# Patient Record
Sex: Male | Born: 1945 | Race: White | Hispanic: No | Marital: Married | Smoking: Former smoker
Health system: Southern US, Community
[De-identification: ages and names within clinical notes are randomized; demographics above are authoritative.]

## PROBLEM LIST (undated history)

## (undated) DIAGNOSIS — W57XXXA Bitten or stung by nonvenomous insect and other nonvenomous arthropods, initial encounter: Secondary | ICD-10-CM

## (undated) DIAGNOSIS — B09 Unspecified viral infection characterized by skin and mucous membrane lesions: Secondary | ICD-10-CM

## (undated) DIAGNOSIS — R55 Syncope and collapse: Secondary | ICD-10-CM

## (undated) DIAGNOSIS — H269 Unspecified cataract: Secondary | ICD-10-CM

## (undated) DIAGNOSIS — I2510 Atherosclerotic heart disease of native coronary artery without angina pectoris: Secondary | ICD-10-CM

## (undated) DIAGNOSIS — M1990 Unspecified osteoarthritis, unspecified site: Secondary | ICD-10-CM

## (undated) DIAGNOSIS — C61 Malignant neoplasm of prostate: Secondary | ICD-10-CM

## (undated) DIAGNOSIS — K219 Gastro-esophageal reflux disease without esophagitis: Secondary | ICD-10-CM

## (undated) DIAGNOSIS — M5126 Other intervertebral disc displacement, lumbar region: Secondary | ICD-10-CM

## (undated) DIAGNOSIS — K5790 Diverticulosis of intestine, part unspecified, without perforation or abscess without bleeding: Secondary | ICD-10-CM

## (undated) DIAGNOSIS — H9319 Tinnitus, unspecified ear: Secondary | ICD-10-CM

## (undated) DIAGNOSIS — G4730 Sleep apnea, unspecified: Secondary | ICD-10-CM

## (undated) DIAGNOSIS — F4329 Adjustment disorder with other symptoms: Secondary | ICD-10-CM

## (undated) DIAGNOSIS — R972 Elevated prostate specific antigen [PSA]: Secondary | ICD-10-CM

## (undated) DIAGNOSIS — N200 Calculus of kidney: Secondary | ICD-10-CM

## (undated) DIAGNOSIS — C4492 Squamous cell carcinoma of skin, unspecified: Secondary | ICD-10-CM

## (undated) DIAGNOSIS — I10 Essential (primary) hypertension: Secondary | ICD-10-CM

## (undated) DIAGNOSIS — G2581 Restless legs syndrome: Secondary | ICD-10-CM

## (undated) DIAGNOSIS — Z87442 Personal history of urinary calculi: Secondary | ICD-10-CM

## (undated) DIAGNOSIS — E785 Hyperlipidemia, unspecified: Secondary | ICD-10-CM

## (undated) DIAGNOSIS — M159 Polyosteoarthritis, unspecified: Secondary | ICD-10-CM

## (undated) DIAGNOSIS — H9190 Unspecified hearing loss, unspecified ear: Secondary | ICD-10-CM

## (undated) DIAGNOSIS — N1330 Unspecified hydronephrosis: Secondary | ICD-10-CM

## (undated) DIAGNOSIS — F419 Anxiety disorder, unspecified: Secondary | ICD-10-CM

## (undated) DIAGNOSIS — Z87438 Personal history of other diseases of male genital organs: Secondary | ICD-10-CM

## (undated) HISTORY — DX: Personal history of other diseases of male genital organs: Z87.438

## (undated) HISTORY — PX: JOINT REPLACEMENT: SHX530

## (undated) HISTORY — DX: Malignant neoplasm of prostate: C61

## (undated) HISTORY — DX: Hyperlipidemia, unspecified: E78.5

## (undated) HISTORY — DX: Calculus of kidney: N20.0

## (undated) HISTORY — DX: Polyosteoarthritis, unspecified: M15.9

## (undated) HISTORY — PX: EYE SURGERY: SHX253

## (undated) HISTORY — PX: SKIN CANCER EXCISION: SHX779

## (undated) HISTORY — DX: Elevated prostate specific antigen (PSA): R97.20

## (undated) HISTORY — PX: CATARACT EXTRACTION, BILATERAL: SHX1313

## (undated) HISTORY — DX: Syncope and collapse: R55

## (undated) HISTORY — DX: Gastro-esophageal reflux disease without esophagitis: K21.9

## (undated) HISTORY — DX: Unspecified viral infection characterized by skin and mucous membrane lesions: B09

## (undated) HISTORY — DX: Unspecified cataract: H26.9

## (undated) HISTORY — PX: TOTAL HIP ARTHROPLASTY: SHX124

## (undated) HISTORY — DX: Adjustment disorder with other symptoms: F43.29

## (undated) HISTORY — DX: Sleep apnea, unspecified: G47.30

## (undated) HISTORY — DX: Essential (primary) hypertension: I10

## (undated) HISTORY — DX: Squamous cell carcinoma of skin, unspecified: C44.92

## (undated) HISTORY — DX: Bitten or stung by nonvenomous insect and other nonvenomous arthropods, initial encounter: W57.XXXA

## (undated) HISTORY — DX: Unspecified osteoarthritis, unspecified site: M19.90

---

## 1982-03-03 HISTORY — PX: ACHILLES TENDON REPAIR: SUR1153

## 2002-11-09 ENCOUNTER — Encounter: Payer: Self-pay | Admitting: Urology

## 2002-11-09 ENCOUNTER — Encounter: Admission: RE | Admit: 2002-11-09 | Discharge: 2002-11-09 | Payer: Self-pay | Admitting: Urology

## 2002-11-10 ENCOUNTER — Encounter: Payer: Self-pay | Admitting: Urology

## 2002-11-10 ENCOUNTER — Ambulatory Visit (HOSPITAL_COMMUNITY): Admission: RE | Admit: 2002-11-10 | Discharge: 2002-11-10 | Payer: Self-pay | Admitting: Urology

## 2003-02-09 ENCOUNTER — Ambulatory Visit (HOSPITAL_COMMUNITY): Admission: RE | Admit: 2003-02-09 | Discharge: 2003-02-09 | Payer: Self-pay | Admitting: Urology

## 2006-05-29 ENCOUNTER — Encounter: Payer: Self-pay | Admitting: Family Medicine

## 2006-07-17 ENCOUNTER — Encounter: Payer: Self-pay | Admitting: Family Medicine

## 2006-10-04 ENCOUNTER — Emergency Department (HOSPITAL_COMMUNITY): Admission: EM | Admit: 2006-10-04 | Discharge: 2006-10-04 | Payer: Self-pay | Admitting: Emergency Medicine

## 2007-04-27 ENCOUNTER — Encounter: Payer: Self-pay | Admitting: Family Medicine

## 2007-08-05 ENCOUNTER — Encounter: Payer: Self-pay | Admitting: Family Medicine

## 2008-02-28 ENCOUNTER — Encounter: Payer: Self-pay | Admitting: Family Medicine

## 2008-02-28 LAB — CONVERTED CEMR LAB
Cholesterol: 118 mg/dL
Direct LDL: 60 mg/dL
HDL: 42 mg/dL
Total CHOL/HDL Ratio: 2.81
Triglycerides: 119 mg/dL

## 2008-08-09 ENCOUNTER — Encounter: Payer: Self-pay | Admitting: Family Medicine

## 2008-08-09 LAB — CONVERTED CEMR LAB
PSA: 3.45 ng/mL
PSA: 3.45 ng/mL

## 2008-09-20 ENCOUNTER — Inpatient Hospital Stay (HOSPITAL_COMMUNITY): Admission: RE | Admit: 2008-09-20 | Discharge: 2008-09-23 | Payer: Self-pay | Admitting: Orthopedic Surgery

## 2009-02-28 ENCOUNTER — Encounter: Payer: Self-pay | Admitting: Family Medicine

## 2009-02-28 LAB — CONVERTED CEMR LAB
ALT: 21 units/L
AST: 23 units/L
Albumin: 4.5 g/dL
Alkaline Phosphatase: 83 units/L
Anion Gap: 12.4
BUN: 12 mg/dL
CO2: 29 meq/L
Calcium: 9.4 mg/dL
Chloride: 102 meq/L
Creatinine, Ser: 0.8 mg/dL
GFR calc Af Amer: 118.14 mL/min
GFR calc non Af Amer: 97.64 mL/min
Glucose, Bld: 91 mg/dL
Hemoglobin: 14.7 g/dL
Potassium: 4.4 meq/L
Sodium: 139 meq/L
Total Bilirubin: 1.1 mg/dL
Total Protein: 7.1 g/dL

## 2009-03-03 DIAGNOSIS — W57XXXA Bitten or stung by nonvenomous insect and other nonvenomous arthropods, initial encounter: Secondary | ICD-10-CM

## 2009-03-03 HISTORY — DX: Bitten or stung by nonvenomous insect and other nonvenomous arthropods, initial encounter: W57.XXXA

## 2009-03-09 ENCOUNTER — Encounter: Payer: Self-pay | Admitting: Family Medicine

## 2009-09-06 ENCOUNTER — Ambulatory Visit: Payer: Self-pay | Admitting: Family Medicine

## 2009-09-06 DIAGNOSIS — M159 Polyosteoarthritis, unspecified: Secondary | ICD-10-CM | POA: Insufficient documentation

## 2009-09-06 DIAGNOSIS — N400 Enlarged prostate without lower urinary tract symptoms: Secondary | ICD-10-CM | POA: Insufficient documentation

## 2009-09-06 DIAGNOSIS — F4329 Adjustment disorder with other symptoms: Secondary | ICD-10-CM | POA: Insufficient documentation

## 2009-09-06 DIAGNOSIS — E785 Hyperlipidemia, unspecified: Secondary | ICD-10-CM | POA: Insufficient documentation

## 2009-09-06 DIAGNOSIS — I10 Essential (primary) hypertension: Secondary | ICD-10-CM | POA: Insufficient documentation

## 2009-09-06 LAB — CONVERTED CEMR LAB

## 2009-09-07 LAB — CONVERTED CEMR LAB
ALT: 19 units/L (ref 0–53)
AST: 20 units/L (ref 0–37)
Albumin: 4.3 g/dL (ref 3.5–5.2)
Alkaline Phosphatase: 74 units/L (ref 39–117)
BUN: 11 mg/dL (ref 6–23)
Bilirubin, Direct: 0.1 mg/dL (ref 0.0–0.3)
CO2: 29 meq/L (ref 19–32)
Calcium: 9 mg/dL (ref 8.4–10.5)
Chloride: 102 meq/L (ref 96–112)
Cholesterol: 118 mg/dL (ref 0–200)
Creatinine, Ser: 0.8 mg/dL (ref 0.4–1.5)
GFR calc non Af Amer: 109.67 mL/min (ref >60)
Glucose, Bld: 94 mg/dL (ref 70–99)
HDL: 39.4 mg/dL (ref >39.00)
LDL Cholesterol: 58 mg/dL (ref 0–99)
PSA: 3.94 ng/mL (ref 0.10–4.00)
Potassium: 4.3 meq/L (ref 3.5–5.1)
Sodium: 141 meq/L (ref 135–145)
Total Bilirubin: 0.6 mg/dL (ref 0.3–1.2)
Total CHOL/HDL Ratio: 3
Total Protein: 6.8 g/dL (ref 6.0–8.3)
Triglycerides: 105 mg/dL (ref 0.0–149.0)
VLDL: 21 mg/dL (ref 0.0–40.0)

## 2009-09-14 ENCOUNTER — Telehealth: Payer: Self-pay | Admitting: Family Medicine

## 2009-09-24 ENCOUNTER — Ambulatory Visit: Payer: Self-pay | Admitting: Family Medicine

## 2009-09-24 DIAGNOSIS — B09 Unspecified viral infection characterized by skin and mucous membrane lesions: Secondary | ICD-10-CM | POA: Insufficient documentation

## 2009-09-25 ENCOUNTER — Encounter: Payer: Self-pay | Admitting: Family Medicine

## 2009-09-25 DIAGNOSIS — K219 Gastro-esophageal reflux disease without esophagitis: Secondary | ICD-10-CM | POA: Insufficient documentation

## 2009-09-28 ENCOUNTER — Telehealth: Payer: Self-pay | Admitting: Family Medicine

## 2010-02-11 ENCOUNTER — Ambulatory Visit: Payer: Self-pay | Admitting: Family Medicine

## 2010-02-11 DIAGNOSIS — J0100 Acute maxillary sinusitis, unspecified: Secondary | ICD-10-CM | POA: Insufficient documentation

## 2010-03-03 HISTORY — PX: SHOULDER ARTHROSCOPY: SHX128

## 2010-03-07 ENCOUNTER — Ambulatory Visit
Admission: RE | Admit: 2010-03-07 | Discharge: 2010-03-07 | Payer: Self-pay | Source: Home / Self Care | Attending: Family Medicine | Admitting: Family Medicine

## 2010-03-12 ENCOUNTER — Telehealth: Payer: Self-pay | Admitting: Family Medicine

## 2010-04-02 NOTE — Letter (Signed)
Summary: Deboraha Sprang @ [facility] @ [employer]   Imported By: Lanelle Bal 10/01/2009 09:35:19  _____________________________________________________________________  External Attachment:    Type:   Image     Comment:   External Document

## 2010-04-02 NOTE — Assessment & Plan Note (Signed)
Summary: red rash all over body/alc   Vital Signs:  Patient profile:   65 year old male Height:      67.5 inches Weight:      179 pounds BMI:     27.72 Temp:     98.4 degrees F oral Pulse rate:   76 / minute Pulse rhythm:   regular BP sitting:   124 / 80  (left arm) Cuff size:   regular  Vitals Entered By: Delilah Shan CMA Sienna Stonehocker Dull) (September 24, 2009 3:29 PM) CC: Red rash all over body   History of Present Illness: 3 days of rash on body.  Itches. It will start out with a small red lesion and then progress to a small vesicle. These then heal up with residual red papule.  Early lesions were on the bilateral lower extremities.  They have progressed (for eruption and subsequent healing) proximally.  No FCNAV, no wheeze.  Slight irritation in throat.  No h/o sig exposures that would explain this other than new detergent.  Wife has similar rash on the wrist.  Topical tx helps with the itching. No lesions on palms or between digits.  Also with occ sweats.   Allergies: 1)  ! Ambien Cr (Zolpidem Tartrate) 2)  ! Doxycycline  Review of Systems       See HPI.  Otherwise negative.    Physical Exam  General:  GEN: nad, alert and oriented HEENT: mucous membranes moist, tm wnl, nasal epithelium wnl, superficial irritation of soft palate noted.  no exudates NECK: supple w/o LA CV: rrr.  no murmur PULM: ctab, no inc wob ABD: soft, +bs EXT: no edema SKIN: small (<1cm) lesion scattered across arms, legs, trunk.  no lesion on palms or tongue.  distal lesions are older and appear to be healing red papules.  proximal lesions are nonumbilicated vesicles.     Impression & Recommendations:  Problem # 1:  UNSPECIFIED VIRAL EXANTHEM (ICD-057.9) This is likely  viral.  I cannot see how environmental lesions would likely cause the oral symptoms and the sweats.  Nontoxic.  Likely will self resolve in the next few days.  Will call back with update.  Claritin for itching along with topical and notify MD if  worsening.  he agrees.   Complete Medication List: 1)  Caduet 10-20 Mg Tabs (Amlodipine-atorvastatin) .... Take 1 tablet by mouth once a day 2)  Celebrex 200 Mg Caps (Celecoxib) .... Take 1 tablet by mouth once a day 3)  Omeprazole 20 Mg Cpdr (Omeprazole) .... Take 1 tablet by mouth once a day 4)  Tramadol Hcl 50 Mg Tabs (Tramadol hcl) .... Take 1 tablet by mouth three times a day 5)  Aspirin 81 Mg Tabs (Aspirin) .... Take 1 tablet by mouth once a day 6)  Methocarbamol 500 Mg Tabs (Methocarbamol) .... As needed 7)  Multivitamins Tabs (Multiple vitamin) .... Take 1 tablet by mouth once a day  Patient Instructions: 1)  Please schedule a follow-up appointment as needed .  Take plain claritin (loratadine) 10mg  a day and continue to use the OTC creams as needed.  Let me know how you are doing in a few days.   Current Allergies (reviewed today): ! AMBIEN CR (ZOLPIDEM TARTRATE) ! DOXYCYCLINE

## 2010-04-02 NOTE — Progress Notes (Signed)
Summary: rash  Phone Note Call from Patient Call back at [phone]   Caller: Patient Call For: Crawford Givens MD Summary of Call: Patient says that his rash is looking better, has been taking claritin as you recommended.  Initial call taken by: Melody Comas,  September 28, 2009 4:36 PM  Follow-up for Phone Call        Good.  I expect it to gradually get better.  Let me know it gets worse.  Follow-up by: Crawford Givens MD,  September 28, 2009 4:46 PM  Additional Follow-up for Phone Call Additional follow up Details #1::        Patient Advised.  Additional Follow-up by: Delilah Shan CMA Leston Schueller Dull),  September 28, 2009 4:56 PM

## 2010-04-02 NOTE — Letter (Signed)
Summary: [name] @ [facility] @ [employer]   Imported By: Lanelle Bal 10/01/2009 09:28:11  _____________________________________________________________________  External Attachment:    Type:   Image     Comment:   External Document

## 2010-04-02 NOTE — Procedures (Signed)
Summary: Colonoscopy/[facility]  Colonoscopy/[facility]   Imported By: Lanelle Bal 10/01/2009 09:34:27  _____________________________________________________________________  External Attachment:    Type:   Image     Comment:   External Document  Appended Document: Colonoscopy/[facility]    Clinical Lists Changes  Observations: Added new observation of COLONNXTDUE: 08/2016 (10/01/2009 10:27) Added new observation of CHIEF CMPLNT: Preventive Care (10/01/2009 10:27) Added new observation of COLONOSCOPY: Diverticulosis (07/17/2006 10:28)       Colorectal Screening:  Colonoscopy Results:    Date of Exam: 07/17/2006    Results: Diverticulosis  Next Colonoscopy Due:    07/16/2016  PSA Screening:    PSA: 3.94  (09/06/2009)  Immunization & Chemoprophylaxis:    Tetanus vaccine: Tdap  (11/20/2006)    Influenza vaccine: Fluvax 3+  (11/23/2007)    Pneumovax: Pneumovax  (12/25/2006)

## 2010-04-02 NOTE — Assessment & Plan Note (Signed)
Summary: CPX/TRANSFER FROM [facility]   Vital Signs:  Patient profile:   65 year old male Height:      67.5 inches Weight:      177.50 pounds BMI:     27.49 Temp:     98.3 degrees F oral Pulse rate:   80 / minute Pulse rhythm:   regular BP sitting:   132 / 92  (left arm) Cuff size:   regular  Vitals Entered By: Delilah Shan CMA Khylan Sawyer Dull) (September 06, 2009 8:07 AM)  Serial Vital Signs/Assessments:  Time      Position  BP       Pulse  Resp  Temp     By                     126/86                         Crawford Givens MD  CC: Transfer from [place] - CPX, Preventive Care   History of Present Illness: Here for physical.  See prev med.   Recent tick bite (06/2009).  Had rash at site along with aches.  Was treated for 3 weeks with doxy.  Felt irritable on the doxy, but was able to tolerate it until last few doses.  Irritability resolved after stopping doxy.  Aches resolved with treatment.  No other rash in the meantime.   Family stress.  Mother with RA and father with AD.  Both living at home with patient.  Pt with trouble going to sleep at night and then anxiety in the AM.  Will notice "that I'm clenching up at work and I just have to relax."  Taking robaxin to help sleep at night with some effect but has noticed change in dreams on the medicine.  "Worried about my mom."  Going on for a few months.  Parents living with patient for 3-4 years.  Also son in law has h/o alcohol use.  He is concerned for his daugter.  There is no known current safety threat.    Preventive Screening-Counseling & Management  Caffeine-Diet-Exercise     Does Patient Exercise: yes  Allergies (verified): 1)  ! Ambien Cr (Zolpidem Tartrate) 2)  ! Doxycycline  Past History:  Past Medical History: Syncope with negative cardiac w/u per Dr. Eldridge Dace OA with R hip replacement Nephrolithiasis HTN HLD Likely tick associated illness 2011, treated with doxy with resolution.  Achilles tendon repair 1984  Family  History: Reviewed history and no changes required. F: A with AD M: A with RA  Social History: Occupation: Art gallery manager distant smoking history Married Alcohol use-no Regular exercise-yes Occupation:  employed Does Patient Exercise:  yes  Review of Systems       See HPI.  Otherwise noncontributory.    Physical Exam  General:  GEN: nad, alert and oriented HEENT: mucous membranes moist NECK: supple w/o LA CV: rrr.  no murmur PULM: ctab, no inc wob ABD: soft, +bs EXT: no edema SKIN: no acute rash, SKs noted on back. Prostate diffusely enlarged w/o nodularity, stool heme neg.    Impression & Recommendations:  Problem # 1:  Preventive Health Care (ICD-V70.0) See prev med.  contact with labs.  Now up to date on prev med.  I d/w patient ZO:XWRU testing.  He was treated and has no symptoms now.  I don't see how testing would change managment.  No further serologic w/u needed. He agrees with plan.  Problem # 2:  HYPERLIPIDEMIA (ICD-272.4)  His updated medication list for this problem includes:    Caduet 10-20 Mg Tabs (Amlodipine-atorvastatin) .Marland Kitchen... Take 1 tablet by mouth once a day  Problem # 3:  HYPERTENSION (ICD-401.9)  His updated medication list for this problem includes:    Caduet 10-20 Mg Tabs (Amlodipine-atorvastatin) .Marland Kitchen... Take 1 tablet by mouth once a day  Orders: TLB-BMP (Basic Metabolic Panel-BMET) (80048-METABOL) TLB-Hepatic/Liver Function Pnl (80076-HEPATIC) TLB-Lipid Panel (80061-LIPID)  BP today: 132/92  Problem # 4:  BENIGN PROSTATIC HYPERTROPHY, HX OF (ICD-V13.8)  Orders: TLB-PSA (Prostate Specific Antigen) (84153-PSA)  Problem # 5:  ADJUSTMENT REACTION WITH PHYSICAL SYMPTOMS (ICD-309.82) Insomnia likely related to stress from family situation.  Encouraged patient to use as needed PM benadryl 25mg  and consider counseling.  He will call back if wants counseling.  No SI/HI.  He will discuss the situation with his daughter and report back as needed.    Complete Medication List: 1)  Caduet 10-20 Mg Tabs (Amlodipine-atorvastatin) .... Take 1 tablet by mouth once a day 2)  Celebrex 200 Mg Caps (Celecoxib) .... Take 1 tablet by mouth once a day 3)  Omeprazole 20 Mg Cpdr (Omeprazole) .... Take 1 tablet by mouth once a day 4)  Tramadol Hcl 50 Mg Tabs (Tramadol hcl) .... Take 1 tablet by mouth three times a day 5)  Aspirin 81 Mg Tabs (Aspirin) .... Take 1 tablet by mouth once a day 6)  Methocarbamol 500 Mg Tabs (Methocarbamol) .... As needed 7)  Multivitamins Tabs (Multiple vitamin) .... Take 1 tablet by mouth once a day  Colorectal Screening:  Current Recommendations:    Hemoccult: NEG X 1 today  Colonoscopy Results:    Results: Normal  Colonoscopy Comments:    negative, done with Dr. Madilyn Fireman  ~2007  Next Colonoscopy Due:    09/01/2015  PSA Screening:    Reviewed PSA screening recommendations: PSA ordered  Immunization & Chemoprophylaxis:    Tetanus vaccine: Tdap  (11/20/2006)    Influenza vaccine: Fluvax 3+  (11/23/2007)    Pneumovax: Pneumovax  (12/25/2006)  Patient Instructions: 1)  Try benadryl 25mg  at night for sleep.  Let me know if it is not getting better.  I would get a recheck in 6 months to check your lipids and blood pressure.  Prescriptions: TRAMADOL HCL 50 MG TABS (TRAMADOL HCL) Take 1 tablet by mouth three times a day  #270 x 3   Entered and Authorized by:   Crawford Givens MD   Signed by:   Crawford Givens MD on 09/06/2009   Method used:   Faxed to ...       [facility] (mail-order)             , [place]         Ph: [phone]       Fax: [phone]   RxID:   [id number] OMEPRAZOLE 20 MG CPDR (OMEPRAZOLE) Take 1 tablet by mouth once a day  #90 x 3   Entered and Authorized by:   Crawford Givens MD   Signed by:   Crawford Givens MD on 09/06/2009   Method used:   Faxed to ...       [facility] (mail-order)             , [place]         Ph: [phone]       Fax: [phone]   RxID:   [id number] CELEBREX 200 MG CAPS  (CELECOXIB) Take 1 tablet by mouth once  a day  #90 x 3   Entered and Authorized by:   Crawford Givens MD   Signed by:   Crawford Givens MD on 09/06/2009   Method used:   Faxed to ...       [facility] (mail-order)             , [place]         Ph: [phone]       Fax: [phone]   RxID:   [id number] CADUET 10-20 MG TABS (AMLODIPINE-ATORVASTATIN) Take 1 tablet by mouth once a day  #90 x 3   Entered and Authorized by:   Crawford Givens MD   Signed by:   Crawford Givens MD on 09/06/2009   Method used:   Faxed to ...       [facility] (mail-order)             , [place]         Ph: [phone]       Fax: [phone]   RxID:   [id number]   Current Allergies (reviewed today): ! AMBIEN CR (ZOLPIDEM TARTRATE) ! DOXYCYCLINE   Immunization History:  TwinRix History:    TwinRix # 1:  twinrix (06/07/2008)    TwinRix # 2:  twinrix (03/29/2008)    TwinRix # 3:  twinrix (11/27/2008)  Tetanus/Td Immunization History:    Tetanus/Td:  tdap (11/20/2006)  Influenza Immunization History:    Influenza:  fluvax 3+ (11/23/2007)  Pneumovax Immunization History:    Pneumovax:  pneumovax (12/25/2006)  Zostavax History:    Zostavax # 1:  zostavax (03/18/2007)      Appended Document: CPX/TRANSFER FROM [facility]   Immunization History:  Typhoid Immunization History:    Typhoid:  Typhoid (03/29/2008)

## 2010-04-02 NOTE — Progress Notes (Signed)
Summary: needs 10 day supply of meds  Phone Note Call from Patient Call back at [phone]   Caller: Patient Call For: Crawford Givens MD Summary of Call: Pt had ordered celebrex and tramadol from [facility], but [facility] has lost the shipment and is trying to find it.  Pt is asking if a 10 day supply of each can be called to [address]. Initial call taken by: Lowella Petties CMA,  September 14, 2009 4:01 PM  Follow-up for Phone Call        yes, please call in 10 day supply on both.  no change in the sig.  thanks. Follow-up by: Crawford Givens MD,  September 14, 2009 4:57 PM  Additional Follow-up for Phone Call Additional follow up Details #1::        Medication phoned to pharmacy.  Additional Follow-up by: Delilah Shan CMA Duncan Dull),  September 14, 2009 4:59 PM

## 2010-04-04 NOTE — Assessment & Plan Note (Signed)
Summary: SINUS INFECTION/CLE   Vital Signs:  Patient profile:   65 year old male Height:      67.5 inches Weight:      182.75 pounds BMI:     28.30 Temp:     98.2 degrees F oral Pulse rate:   80 / minute Pulse rhythm:   regular BP sitting:   134 / 88  (left arm) Cuff size:   regular  Vitals Entered By: Delilah Shan CMA Ladislaus Repsher Dull) (February 11, 2010 10:43 AM) CC: ? sinus infection.  About to finish a course of Amox. 500 mg. 2 tabs bid    History of Present Illness: Cough and drainage for about 2 weeks.  Was seen at a weekend clinic and was started on amoxil.  Today is the last day of the antibiotics.  Went to dentist Thursday for gum lesion to be removed.  Was feeling better at the end of last week but had more sweats over the weekend.  Felt 'rotten' yesterday with some sweats- temp would alternate from 97-99.  Feeling better today but still feels weak in legs (but not focally weak).  "today is the best I've felt in 10 days."  Mother had been in the hospital twice and father has been incontinent at home.  L facial pain is much better today.    Allergies: 1)  ! Ambien Cr (Zolpidem Tartrate) 2)  ! Doxycycline 3)  ! Ibuprofen  Review of Systems       See HPI.  Otherwise negative.    Physical Exam  General:  GEN: nad, alert and oriented HEENT: mucous membranes moist, TM w/o erythema, nasal epithelium injected, OP with cobblestoning, L lower gum line is healing. no exudates, frontal and max sinuses not tender to palpation  NECK: supple w/o LA CV: rrr. PULM: ctab, no inc wob ABD: soft, +bs EXT: no edema    Impression & Recommendations:  Problem # 1:  ACUTE MAXILLARY SINUSITIS (ICD-461.0) Assessment Improved Improving but not resolved totally.  Hold rx for amoxil and restart if symptoms don't fully resolve.  He agrees.  Supportive tx o/w.  I d/w patient about his L sided ETD.  I would expect this to get better.  His gum line appears to be healing well and he'll follow up with  dental about the path report.  Nontoxic.    His updated medication list for this problem includes:    Amoxicillin 500 Mg Caps (Amoxicillin) .Marland Kitchen... 2 by mouth two times a day  Complete Medication List: 1)  Caduet 10-20 Mg Tabs (Amlodipine-atorvastatin) .... Take 1 tablet by mouth once a day 2)  Celebrex 200 Mg Caps (Celecoxib) .... Take 1 tablet by mouth once a day 3)  Omeprazole 20 Mg Cpdr (Omeprazole) .... Take 1 tablet by mouth once a day 4)  Tramadol Hcl 50 Mg Tabs (Tramadol hcl) .... Take 1 tablet by mouth three times a day 5)  Aspirin 81 Mg Tabs (Aspirin) .... Take 1 tablet by mouth once a day 6)  Methocarbamol 500 Mg Tabs (Methocarbamol) .... As needed 7)  Amoxicillin 500 Mg Caps (Amoxicillin) .... 2 by mouth two times a day  Patient Instructions: 1)  Get plenty of rest, drink lots of clear liquids, and use Tylenol for fever and comfort. Use the neti pot and restart the antibiotics if your symptoms are getting worse.  Call me as needed.  Take care. Prescriptions: AMOXICILLIN 500 MG CAPS (AMOXICILLIN) 2 by mouth two times a day  #28 x 0  Entered and Authorized by:   Crawford Givens MD   Signed by:   Crawford Givens MD on 02/11/2010   Method used:   Print then Give to Patient   RxID:   [id number]    Orders Added: 1)  Est. Patient Level III [14782]   Immunization History:  Influenza Immunization History:    Influenza:  historical (01/10/2010)   Immunization History:  Influenza Immunization History:    Influenza:  Historical (01/10/2010)  Current Allergies (reviewed today): ! AMBIEN CR (ZOLPIDEM TARTRATE) ! DOXYCYCLINE ! IBUPROFEN

## 2010-04-04 NOTE — Assessment & Plan Note (Signed)
Summary: ANXIETY,NERVES/CLE   Vital Signs:  Patient profile:   65 year old male Height:      67.5 inches Weight:      184.50 pounds BMI:     28.57 Temp:     97.7 degrees F oral Pulse rate:   63 / minute Pulse rhythm:   regular BP sitting:   132 / 80  (right arm) Cuff size:   regular  Vitals Entered By: Linde Gillis CMA Modelle Vollmer Dull) (March 07, 2010 2:01 PM) CC: anxiety, nerves   History of Present Illness: Parents living with patient.  Mother with diverticulitis and dysphagia, 3 admissions to hospital recently.  Had follow up with GI on Monday.  She has less strength and isn't eating well.  She 'doesn't want to go back to the hospital.  Just let me die at home.'  She sees Dr. Barbaraann Barthel at [place].  Was put on cipro/flagyl/PPI.  She had prev endoscopy and esophageal stretching per patient.  He is crushing the pills and trying to get liquids- she doesn't have problems with liquids per his report.  They are still caring for this father (he's incontinent).  Pt's daughter and her husband are going to separate.  He is worried about his daughter and grandkids.    Sleep has been disrupted and he is worried/anxious.  He is still working in addition to everything else.  He can feel himself clenching up.  Asking for advice.   Allergies: 1)  ! Ambien Cr (Zolpidem Tartrate) 2)  ! Doxycycline 3)  ! Ibuprofen  Past History:  Past Medical History: Last updated: 10/01/2009 Syncope with negative cardiac w/u per Dr. Eldridge Dace OA with R hip replacement Nephrolithiasis Likely tick associated illness 2011, treated with doxy with resolution.  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  Otherwise negative.  No SI/HI.   Physical Exam  General:  no apparent distress, appropriate.  remainder deferred.    Impression & Recommendations:  Problem # 1:  ADJUSTMENT REACTION WITH PHYSICAL SYMPTOMS (ICD-309.82) >25 min spent with patient, at least half of which was spent on counseling ZO:XWRU. Mother has follow up with GI on Monday.  I advised patient to ask about the expected course.  If this is correctable, then the outlook is much different (compared to a persistent dysphagia with asp pna risk, etc).  He understood.  Daughter has family support.  In meantime, he can use xanax with sedation caution.  He doesn't drink.  He'll call back with update.    Complete Medication List: 1)  Caduet 10-20 Mg Tabs (Amlodipine-atorvastatin) .... Take 1 tablet by mouth once a day 2)  Celebrex 200 Mg Caps (Celecoxib) .... Take 1 tablet by mouth once a day 3)  Omeprazole 20 Mg  Cpdr (Omeprazole) .... Take 1 tablet by mouth once a day 4)  Tramadol Hcl 50 Mg Tabs (Tramadol hcl) .... Take 1 tablet by mouth three times a day 5)  Aspirin 81 Mg Tabs (Aspirin) .... Take 1 tablet by mouth once a day 6)  Methocarbamol 500 Mg Tabs (Methocarbamol) .... As needed 7)  Alprazolam 0.5 Mg Tabs (Alprazolam) .... 1/2 to 1 tab by mouth up to three times a day as needed for anxiety, sedation caution  Patient Instructions: 1)  I would use the xanax as needed.  Start with a 1/2 a  tab.  It can make you drowsy.  I would talk with Dr. Matthias Hughs about your mother's condition.  See if he thinks it is correctable.  If it isn't, see how much they can compensate for it.  That would greatly influence her care from this point onward.  I would ask your mother what she means when she says she doesn't want to be at the hospital.   2)  Call me with an update next week.  Take care.  Prescriptions: ALPRAZOLAM 0.5 MG TABS (ALPRAZOLAM) 1/2 to 1 tab by mouth up to three times a day as needed for anxiety, sedation caution  #30 x 1   Entered and Authorized by:   Crawford Givens MD   Signed by:   Crawford Givens MD on 03/07/2010   Method used:   Print then Give to Patient   RxID:   [id number]    Orders Added: 1)  Est. Patient Level IV [14782]    Current Allergies (reviewed today): ! AMBIEN CR (ZOLPIDEM TARTRATE) ! DOXYCYCLINE ! IBUPROFEN

## 2010-04-04 NOTE — Progress Notes (Signed)
Summary: xanax dose is too strong  Phone Note Call from Patient Call back at [phone]   Caller: Patient Call For: Crawford Givens MD Summary of Call: Pt states his xanax dose is to high for him.  He would like to have script for .25 mg's that he can cut in half.  [facility] uses [facility] [address]. He is willing to bring the pills that he has in to be thrown away.                Lowella Petties CMA, AAMA  March 12, 2010 12:15 PM   Follow-up for Phone Call        please call in xanax 0.25mg  1/2 to 1 tab by mouth three times a day as needed with sedation caution.  #30, 1rf.  have him let me know if that isn't working.   have him dispose of the old bottle (the pharmacy may be able to help him).  he doesn't have to bring it in.   thanks. Crawford Givens MD  March 12, 2010 1:23 PM  Follow-up by: Crawford Givens MD,  March 12, 2010 1:23 PM  Additional Follow-up for Phone Call Additional follow up Details #1::        Left message on voicemail  in detail.  Personalized VM.   Medication phoned to pharmacy.  Additional Follow-up by: Delilah Shan CMA (AAMA),  March 12, 2010 3:44 PM    New/Updated Medications: ALPRAZOLAM 0.25 MG TABS (ALPRAZOLAM) Take 1/2 to 1 tab by mouth three times a day as needed with sedation caution. Prescriptions: ALPRAZOLAM 0.25 MG TABS (ALPRAZOLAM) Take 1/2 to 1 tab by mouth three times a day as needed with sedation caution.  #30 x 1   Entered by:   Delilah Shan CMA (AAMA)   Authorized by:   Crawford Givens MD   Signed by:   Delilah Shan CMA (AAMA) on 03/12/2010   Method used:   Handwritten   RxID:   [id number]

## 2010-04-11 ENCOUNTER — Encounter: Payer: Self-pay | Admitting: Family Medicine

## 2010-04-18 NOTE — Miscellaneous (Signed)
  Clinical Lists Changes  Medications: Added new medication of AMOXICILLIN 875 MG TABS (AMOXICILLIN) Take 1 tablet by mouth two times a day - Signed Rx of AMOXICILLIN 875 MG TABS (AMOXICILLIN) Take 1 tablet by mouth two times a day;  #20 x 0;  Signed;  Entered by: Delilah Shan CMA (AAMA);  Authorized by: Crawford Givens MD;  Method used: [facility]  [address] [id number] [address] [place], [place], [place]  [address], Ph: [phone] or [phone], Fax: [phone]    Prescriptions: AMOXICILLIN 875 MG TABS (AMOXICILLIN) Take 1 tablet by mouth two times a day  #20 x 0   Entered by:   Delilah Shan CMA (AAMA)   Authorized by:   Crawford Givens MD   Signed by:   Delilah Shan CMA (AAMA) on 04/11/2010   Method used:   [facility]        [facility]  [address] [id number]       [address]       [place], [place]  [address]       Ph: [phone] or [phone]       Fax: [phone]   RxID:   [id number]

## 2010-04-30 NOTE — Letter (Signed)
Summary: Letter from Patient with Questions Regarding Meds  Letter from Patient with Questions Regarding Meds   Imported By: Lanelle Bal 04/22/2010 15:24:13  _____________________________________________________________________  External Attachment:    Type:   Image     Comment:   External Document

## 2010-06-09 LAB — BASIC METABOLIC PANEL
BUN: 7 mg/dL (ref 6–23)
BUN: 7 mg/dL (ref 6–23)
CO2: 28 mEq/L (ref 19–32)
CO2: 31 mEq/L (ref 19–32)
Calcium: 7.8 mg/dL — ABNORMAL LOW (ref 8.4–10.5)
Calcium: 8.1 mg/dL — ABNORMAL LOW (ref 8.4–10.5)
Chloride: 100 mEq/L (ref 96–112)
Chloride: 102 mEq/L (ref 96–112)
Creatinine, Ser: 0.74 mg/dL (ref 0.4–1.5)
Creatinine, Ser: 0.84 mg/dL (ref 0.4–1.5)
GFR calc Af Amer: >60 mL/min (ref >60)
GFR calc Af Amer: >60 mL/min (ref >60)
GFR calc non Af Amer: >60 mL/min (ref >60)
GFR calc non Af Amer: >60 mL/min (ref >60)
Glucose, Bld: 127 mg/dL — ABNORMAL HIGH (ref 70–99)
Glucose, Bld: 139 mg/dL — ABNORMAL HIGH (ref 70–99)
Potassium: 3.6 mEq/L (ref 3.5–5.1)
Potassium: 4.5 mEq/L (ref 3.5–5.1)
Sodium: 136 mEq/L (ref 135–145)
Sodium: 138 mEq/L (ref 135–145)

## 2010-06-09 LAB — COMPREHENSIVE METABOLIC PANEL
ALT: 20 U/L (ref 0–53)
AST: 24 U/L (ref 0–37)
Albumin: 3.9 g/dL (ref 3.5–5.2)
Alkaline Phosphatase: 82 U/L (ref 39–117)
BUN: 7 mg/dL (ref 6–23)
CO2: 29 mEq/L (ref 19–32)
Calcium: 9.4 mg/dL (ref 8.4–10.5)
Chloride: 104 mEq/L (ref 96–112)
Creatinine, Ser: 0.86 mg/dL (ref 0.4–1.5)
GFR calc Af Amer: >60 mL/min (ref >60)
GFR calc non Af Amer: >60 mL/min (ref >60)
Glucose, Bld: 91 mg/dL (ref 70–99)
Potassium: 3.4 mEq/L — ABNORMAL LOW (ref 3.5–5.1)
Sodium: 141 mEq/L (ref 135–145)
Total Bilirubin: 0.8 mg/dL (ref 0.3–1.2)
Total Protein: 7 g/dL (ref 6.0–8.3)

## 2010-06-09 LAB — CBC
HCT: 28.1 % — ABNORMAL LOW (ref 39.0–52.0)
HCT: 30.4 % — ABNORMAL LOW (ref 39.0–52.0)
HCT: 42.6 % (ref 39.0–52.0)
Hemoglobin: 10.3 g/dL — ABNORMAL LOW (ref 13.0–17.0)
Hemoglobin: 14.3 g/dL (ref 13.0–17.0)
Hemoglobin: 9.2 g/dL — ABNORMAL LOW (ref 13.0–17.0)
Hemoglobin: 9.5 g/dL — ABNORMAL LOW (ref 13.0–17.0)
MCHC: 33.5 g/dL (ref 30.0–36.0)
MCHC: 33.8 g/dL (ref 30.0–36.0)
MCHC: 34 g/dL (ref 30.0–36.0)
MCHC: 34.2 g/dL (ref 30.0–36.0)
MCV: 89.7 fL (ref 78.0–100.0)
MCV: 90.2 fL (ref 78.0–100.0)
MCV: 90.4 fL (ref 78.0–100.0)
Platelets: 181 10*3/uL (ref 150–400)
Platelets: 189 10*3/uL (ref 150–400)
Platelets: 222 10*3/uL (ref 150–400)
Platelets: 286 10*3/uL (ref 150–400)
RBC: 3.11 MIL/uL — ABNORMAL LOW (ref 4.22–5.81)
RBC: 3.39 MIL/uL — ABNORMAL LOW (ref 4.22–5.81)
RBC: 4.72 MIL/uL (ref 4.22–5.81)
RDW: 12.8 % (ref 11.5–15.5)
RDW: 12.9 % (ref 11.5–15.5)
RDW: 13 % (ref 11.5–15.5)
RDW: 13.3 % (ref 11.5–15.5)
WBC: 7 10*3/uL (ref 4.0–10.5)
WBC: 8.5 10*3/uL (ref 4.0–10.5)
WBC: 9.9 10*3/uL (ref 4.0–10.5)

## 2010-06-09 LAB — PROTIME-INR
INR: 1 (ref 0.00–1.49)
INR: 1.2 (ref 0.00–1.49)
INR: 2 — ABNORMAL HIGH (ref 0.00–1.49)
Prothrombin Time: 13.5 seconds (ref 11.6–15.2)
Prothrombin Time: 15.3 seconds — ABNORMAL HIGH (ref 11.6–15.2)
Prothrombin Time: 21 seconds — ABNORMAL HIGH (ref 11.6–15.2)

## 2010-06-09 LAB — TYPE AND SCREEN
ABO/RH(D): A POS
Antibody Screen: NEGATIVE

## 2010-06-09 LAB — ABO/RH: ABO/RH(D): A POS

## 2010-06-09 LAB — URINALYSIS, ROUTINE W REFLEX MICROSCOPIC
Bilirubin Urine: NEGATIVE
Glucose, UA: NEGATIVE mg/dL
Hgb urine dipstick: NEGATIVE
Ketones, ur: NEGATIVE mg/dL
Nitrite: NEGATIVE
Protein, ur: NEGATIVE mg/dL
Specific Gravity, Urine: 1.013 (ref 1.005–1.030)
Urobilinogen, UA: 0.2 mg/dL (ref 0.0–1.0)
pH: 7 (ref 5.0–8.0)

## 2010-06-09 LAB — APTT: aPTT: 32 seconds (ref 24–37)

## 2010-07-16 NOTE — H&P (Signed)
NAME:  [name], [name] NO.:  [id number]   MEDICAL RECORD NO.:  [id number]          PATIENT TYPE:  INP   LOCATION:  NA                           FACILITY:  [facility]   PHYSICIAN:  Ollen Gross, M.D.    DATE OF BIRTH:  [birth year]   DATE OF ADMISSION:  09/20/2008  DATE OF DISCHARGE:                              HISTORY & PHYSICAL   CHIEF COMPLAINT:  Right hip pain.   HISTORY OF PRESENT ILLNESS:  The patient is a 65 year old male who has  been seen by Dr. Lequita Halt for ongoing right hip pain.  He was seen back  in December 2009 and was noted at that time to have end-stage arthritis.  He has been treated conservatively for his arthritis utilizing  medications.  He is at a point now where he is having significant pain,  and his functions are becoming limited.  He is known to have end stage  and felt to be a good candidate.  He has been seen preoperatively by Dr.  Para March and felt to be low risk for surgery.  Risks and benefits have  been discussed, and he has elected to proceed with surgery.   ALLERGIES:  No known drug allergies.   INTOLERANCES:  AMBIEN.   CURRENT MEDICATIONS:  1. Caduet 10/20.  2. Celebrex 200 mg.  3. Omeprazole 20 mg.  4. Tramadol 50 mg.  5. Baby aspirin.  6. Airborne Formula Supplement 1 a day Men's Formula.  7. Oxycodone.   PAST MEDICAL HISTORY:  Tinnitus, hypertension, hypercholesterolemia,  diverticulosis, history of renal calculi.   PAST SURGICAL HISTORY:  Repair of torn Achilles tendon on the left, has  undergone lithotripsy and colonoscopy.   FAMILY HISTORY:  Father with a history of Alzheimer's and stroke, mother  with rheumatoid arthritis, twin sisters Legg-Perthes disease as  children, a brother with Legg-Perthes.   SOCIAL HISTORY:  Married.  He is an Art gallery manager.  Past smoker, no alcohol.  He has one son living, one daughter deceased and also a Museum/gallery conservator.  Wife will be assisting with care after surgery.  One-story home with one  step entering.  He does have a living will.   REVIEW OF SYSTEMS:  GENERAL:  No fever, chills, night sweats.  NEURO:  He does have tinnitus or ringing, no seizure, syncope or paralysis.  RESPIRATORY:  No shortness of breath, productive cough or hemoptysis.  CARDIOVASCULAR:  No chest pain, angina or orthopnea.  GI:  No nausea,  vomiting, diarrhea or constipation.  GU:  No dysuria, hematuria or  discharge.  MUSCULOSKELETAL:  Hip pain.   PHYSICAL EXAMINATION:  VITAL SIGNS:  Pulse 64, respirations 12, blood  pressure 118/72.  GENERAL:  A 65 year old white male well nourished, well developed, in no  acute distress.  He is alert and cooperative.  HEENT:  Normocephalic, atraumatic.  Pupils are round and reactive.  EOMs  are intact.  NECK:  Supple.  CHEST:  Clear.  HEART:  Regular rate and rhythm.  No murmur.  ABDOMEN:  Soft, flat, nontender, bowel sounds present.  RECTAL/BREASTS/GENITALIA:  No done, not  pertinent to present illness.  EXTREMITIES:  Right hip motor function is intact, flexion 100 degrees,  internal rotation 0, external rotation 10, 20 degrees abduction.   IMPRESSION:  Osteoarthritis of right hip.   PLAN:  Patient is admitted to [facility] to undergo a right  total hip replacement arthroplasty.  Surgery will be performed by Dr.  Ollen Gross.   His medical physician is Dr. Para March over at [facility]  Medicine at [facility].  [facility] will be consulted if  needed for medical assistance during the hospital course.      Alexzandrew L. Perkins, P.A.C.      Ollen Gross, M.D.  Electronically Signed    ALP/MEDQ  D:  09/19/2008  T:  09/19/2008  Job:  [id number]   cc:   Dwana Curd. Para March, M.D.  Fax: [phone]

## 2010-07-16 NOTE — Op Note (Signed)
NAME:  [name], [name] NO.:  [id number]   MEDICAL RECORD NO.:  [id number]          PATIENT TYPE:  INP   LOCATION:  0003                         FACILITY:  [facility]   PHYSICIAN:  Ollen Gross, M.D.    DATE OF BIRTH:  26-Dec-1945   DATE OF PROCEDURE:  09/20/2008  DATE OF DISCHARGE:                               OPERATIVE REPORT   PREOPERATIVE DIAGNOSES:  Osteoarthritis, right hip.   POSTOPERATIVE DIAGNOSES:  Osteoarthritis, right hip.   PROCEDURE:  Right total hip arthroplasty.   SURGEON:  Ollen Gross, M.D.   ASSISTANT:  [name] PA-C   ANESTHESIA:  Spinal.   ESTIMATED BLOOD LOSS:  400.   DRAIN:  Hemovac times one.   COMPLICATIONS:  None.   CONDITION:  Stable to recovery room.   CLINICAL NOTE:  [name] is a 65 year old male who has end-stage  arthritis of the right hip with progressively worsening pain and  dysfunction.  He presents now for right total hip arthroplasty.   PROCEDURE IN DETAIL:  After successful administration of spinal  anesthetic, the patient is placed in the left lateral decubitus position  with the right side up and held with the hip positioner.  Right lower  extremity is isolated from his perineum with plastic drapes and prepped  and draped in the usual sterile fashion.  Short posterolateral incision  is made with a 10-blade through subcutaneous tissue to the level of the  fascia lata.  The fascia lata is incised in line with the skin incision.  The sciatic nerve is palpated and protected and the short rotators  isolated off the femur, as is the capsule.  Capsulotomy is performed and  hip is dislocated.  The center of femoral head is marked and the trial  prosthesis is placed such that the center of the trial head corresponds  to the center of his native femoral head.  Osteotomy line is marked on  the femoral neck and osteotomy made with an oscillating saw.  The  femoral head is then removed and the femur retracted anteriorly  to gain  acetabular exposure.   Acetabular retractors are placed and labrum and osteophytes removed.  Acetabular reaming starts at 47 mm, coursing increments of 2-53 mm and  then a 54-mm Pinnacle acetabular shell is placed in anatomic position  with excellent purchase.  There was no need for additional dome screws.  The apex hole eliminator is placed and then a 36-mm neutral ULTAMET  metal liner is placed for metal-on-metal hip replacement.   The femur is repaired with the canal finder and irrigation.  Axial  reaming is performed up to 15.5 mm, proximal reaming to a 44F and the  sleeve machined to a large.  44F large trial sleeve is placed with 20 x  15 stem and 36 + 12 neck, matching native anteversion.  A 36.0 head is  placed and the hip is reduced.  He has excellent stability with full  extension, full external rotation, 70 degrees flexion, 40 degrees  abduction, 90 degrees internal rotation and 90 degrees of flexion and  about 50 degrees of internal rotation.  By placing the right leg on top  of the left, the leg is a little bit short by a couple of millimeters.  We went to a 36 + 3 and there was better soft-tissue tension.  I was  also able to flex 90 and rotate internally 70 at that point.   The hip was then dislocated and trials removed.  The permanent normal  63F large sleeve is placed with a 20 x 15 stem and 36 + 12 neck,  matching native anteversion.  The 36 + 3 head is placed and the hip is  reduced with the same stability parameters.  Wound was copiously  irrigated with saline solution and the short rotators and capsule  reattached to the femur through drill holes.  Fascia lata was closed  over a Hemovac drain with interrupted #1 Vicryl, subcu closed with #1  and 2-0 Vicryl and subcuticular running 4-0 Monocryl.  Drains were  hooked to suction.  Incision cleaned and dried and Steri-Strips and a  bulky sterile dressing applied.  He was then placed into a knee  immobilizer,  awakened and transferred to recovery in stable condition.      Ollen Gross, M.D.  Electronically Signed     FA/MEDQ  D:  09/20/2008  T:  09/20/2008  Job:  [id number]

## 2010-07-19 NOTE — Discharge Summary (Signed)
[name], [name] NO.:  [id number]   MEDICAL RECORD NO.:  [id number]          PATIENT TYPE:  INP   LOCATION:  1614                         FACILITY:  [facility]   PHYSICIAN:  Ollen Gross, M.D.    DATE OF BIRTH:  [birth year]   DATE OF ADMISSION:  09/20/2008  DATE OF DISCHARGE:  09/23/2008                               DISCHARGE SUMMARY   ADMISSION DIAGNOSES:  1. Osteoarthritis right hip.  2. Tinnitus.  3. Hypertension.  4. Hypercholesterolemia.  5. Diverticulosis.  6. History of renal calculi.   DISCHARGE DIAGNOSES:  1. Osteoarthritis right hip status post right total hip replacement      arthroplasty.  2. Postoperative acute blood loss anemia did not require transfusion.  3. Hypertension.  4. Hypercholesterolemia.  5. Diverticulosis.  6. History of renal calculi.   PROCEDURE:  September 20, 2008 right total hip.  Surgeon Dr. Lequita Halt,  assistant [name] PA-C.  Spinal anesthesia.   CONSULTANTS:  None.   BRIEF HISTORY:  [name] is a 65 year old male with end-stage arthritis  of the right hip, progressive worsening pain and dysfunction who now  presents for total hip arthroplasty.   LABORATORY DATA:  Preop CBC showed hemoglobin of 14.3, hematocrit of  42.6, white cell count 7.1, platelets 286, postop hemoglobin 10.3  drifted down to 9.5.  Last noted H and H 9.2 and 27.0.  PT/PTT preop  13.5 and 32 respectively with an INR 1.0.  Serial protimes followed last  PT/INR 24.0 and 2.0.   The Chem panel on admission, mildly low potassium at 3.4.  Remaining  Chem panel within normal limits.  Serial BMETS were followed.  Electrolytes remained within normal limits.  Potassium came up to a  normal level.  Preop UA was negative.  Blood group type was A+.   X-RAYS:  Hip pelvis film; good position and alignment of right hip  arthroplasty.  Two-view chest September 18, 2008; minimal scarring left lung  base.  No active disease.  Preop hip film September 18, 2008; advanced  bilateral hip arthritis worse on the right.   EKG on the chart dated April 27, 2007; normal sinus rhythm rate 65,  normal EKG confirmed by Dr. Albertine Patricia.   HOSPITAL COURSE:  The patient was admitted to [facility],  taken to the OR, underwent above-stated procedure without complication.  The patient tolerated the procedure well, later transferred to the  recovery room and orthopedic floor, started on PCA and p.o. analgesics,  doing very well on the morning of day one.  Actually walked about 160  feet, partial weightbearing.  Hemoglobin was stable.  Good urine output,  started back on his home meds.  By day two the patient was doing well  walking up in the hallways over 180 feet.  Dressing change, incision  looked good, meeting his goals.  Weaned over to p.o. meds and was ready  to go home by postop day #3.   DISCHARGE PLAN:  1. Was discharged home on September 23, 2008.  2. Discharge diagnoses please see above.  3. Discharge meds:  Percocet, Robaxin, Nu-Iron Coumadin.  4. Diet; heart-healthy, low-sodium, low-cholesterol diet.   ACTIVITY:  Partial weightbearing 25-50% to the right leg.  Hip  precautions total hip protocol.  May start showering.  Follow-up in 2  weeks.   DISPOSITION:  Home.   CONDITION ON DISCHARGE:  Improving.      Alexzandrew L. Perkins, P.A.C.      Ollen Gross, M.D.  Electronically Signed    ALP/MEDQ  D:  10/26/2008  T:  10/26/2008  Job:  [id number]   cc:   Dwana Curd. Para March, M.D.  Fax: [phone]

## 2010-09-04 ENCOUNTER — Other Ambulatory Visit: Payer: Self-pay | Admitting: Family Medicine

## 2010-09-04 DIAGNOSIS — N400 Enlarged prostate without lower urinary tract symptoms: Secondary | ICD-10-CM

## 2010-09-04 DIAGNOSIS — I10 Essential (primary) hypertension: Secondary | ICD-10-CM

## 2010-09-05 ENCOUNTER — Encounter: Payer: Self-pay | Admitting: Family Medicine

## 2010-09-06 ENCOUNTER — Other Ambulatory Visit (INDEPENDENT_AMBULATORY_CARE_PROVIDER_SITE_OTHER): Payer: 59 | Admitting: Family Medicine

## 2010-09-06 DIAGNOSIS — I10 Essential (primary) hypertension: Secondary | ICD-10-CM

## 2010-09-06 DIAGNOSIS — N400 Enlarged prostate without lower urinary tract symptoms: Secondary | ICD-10-CM

## 2010-09-06 LAB — LIPID PANEL
HDL: 37.5 mg/dL — ABNORMAL LOW (ref >39.00)
Triglycerides: 108 mg/dL (ref 0.0–149.0)

## 2010-09-06 LAB — COMPREHENSIVE METABOLIC PANEL
AST: 23 U/L (ref 0–37)
BUN: 15 mg/dL (ref 6–23)
CO2: 31 mEq/L (ref 19–32)
Calcium: 8.9 mg/dL (ref 8.4–10.5)
Chloride: 103 mEq/L (ref 96–112)
Creatinine, Ser: 0.9 mg/dL (ref 0.4–1.5)
GFR: 96.08 mL/min (ref >60.00)
Total Bilirubin: 0.6 mg/dL (ref 0.3–1.2)

## 2010-09-13 ENCOUNTER — Encounter: Payer: Self-pay | Admitting: Family Medicine

## 2010-09-13 ENCOUNTER — Ambulatory Visit (INDEPENDENT_AMBULATORY_CARE_PROVIDER_SITE_OTHER): Payer: 59 | Admitting: Family Medicine

## 2010-09-13 VITALS — BP 110/80 | HR 80 | Temp 98.7°F | Ht 67.5 in | Wt 174.1 lb

## 2010-09-13 DIAGNOSIS — S46219A Strain of muscle, fascia and tendon of other parts of biceps, unspecified arm, initial encounter: Secondary | ICD-10-CM | POA: Insufficient documentation

## 2010-09-13 DIAGNOSIS — E785 Hyperlipidemia, unspecified: Secondary | ICD-10-CM

## 2010-09-13 DIAGNOSIS — I10 Essential (primary) hypertension: Secondary | ICD-10-CM

## 2010-09-13 DIAGNOSIS — M159 Polyosteoarthritis, unspecified: Secondary | ICD-10-CM

## 2010-09-13 DIAGNOSIS — S43499A Other sprain of unspecified shoulder joint, initial encounter: Secondary | ICD-10-CM

## 2010-09-13 MED ORDER — CELECOXIB 200 MG PO CAPS: 200.0000 mg | ORAL_CAPSULE | Freq: Every day | ORAL | Status: DC

## 2010-09-13 MED ORDER — AMLODIPINE-ATORVASTATIN 10-20 MG PO TABS: 1.0000 | ORAL_TABLET | Freq: Every day | ORAL | Status: DC

## 2010-09-13 MED ORDER — TRAMADOL HCL 50 MG PO TABS: 50.0000 mg | ORAL_TABLET | Freq: Three times a day (TID) | ORAL | Status: DC | PRN

## 2010-09-13 MED ORDER — OMEPRAZOLE 20 MG PO CPDR: 20.0000 mg | DELAYED_RELEASE_CAPSULE | Freq: Every day | ORAL | Status: DC

## 2010-09-13 NOTE — Patient Instructions (Signed)
See Shirlee Limerick about your referral before your leave today. Take care.   I would look into the support group for dementia care givers.  Physical in 1 year.   Glad to see you today.

## 2010-09-13 NOTE — Progress Notes (Signed)
Both parents in a nursing home and he feels guilty.  Both with memory troubles, father > mother.  I asked him "Did you do the best you could?"  Without hesitation-"Yes."  We talked about the guilt.   He's still doing the best he can.  He'll look into support groups.   Daughter is in the midst of a divorce.  He's worried about the grandkids, he's spending time with them. Plans to retire 1/13.  He was taking 1/4 of a 1/4mg  xanax and it helped but he didn't like the after effects (muscle aches).  He stopped it.    Hypertension:    Using medication without problems or lightheadedness: yes Chest pain with exertion:no Edema:no Short of breath:no Other issues:asking about altitude sickness prevention for trip to [place].    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    R shoulder with audible noise on ROM.  Sightly dec in R biceps tone with certain postures.  Prev with R shoulder injected with Dr. Rennis Chris  Prostate cancer screening.  PSA not elevated.  D/w pt.    All labs d/w pt.   Meds, vitals, and allergies reviewed.   PMH and SH reviewed  ROS: See HPI.  Otherwise negative.    GEN: nad, alert and oriented HEENT: mucous membranes moist NECK: supple w/o LA CV: rrr. PULM: ctab, no inc wob ABD: soft, +bs EXT: no edema SKIN: no acute rash, prev bx site on R shoulder w/o ulceration or mass Prostate gland firm and smooth, B enlargement, but no nodularity, tenderness, mass, asymmetry or induration. R shoulder with normal ROM but a nontender mass is noted on the anterior portion, near the insertion of biceps.  There is a portion superiorly where muscle appears to be absent

## 2010-09-15 ENCOUNTER — Telehealth: Payer: Self-pay | Admitting: Family Medicine

## 2010-09-15 ENCOUNTER — Encounter: Payer: Self-pay | Admitting: Family Medicine

## 2010-09-15 MED ORDER — ACETAZOLAMIDE 125 MG PO TABS: 125.0000 mg | ORAL_TABLET | Freq: Two times a day (BID) | ORAL | Status: AC

## 2010-09-15 NOTE — Assessment & Plan Note (Signed)
Controlled, d/w pt about diet/meds/weight

## 2010-09-15 NOTE — Assessment & Plan Note (Signed)
Presumed tear, refer to ortho.

## 2010-09-15 NOTE — Assessment & Plan Note (Signed)
Continue current meds 

## 2010-09-15 NOTE — Telephone Encounter (Signed)
Please call pt.  He can take acetazolamide, 125mg  po bid.  Start: 24-48h before ascent; D/C 48h after peak arrival.  This may help with the altitude sickness, but I want him to try to take it at [place] elevation first for a few days.  It can affect his BP, so if he gets dizzy on standing (either here or at altitude), he'll need to stop the medicine.  Make sure to stay hydrated while on the medicine. I sent it in.  Thanks.

## 2010-09-15 NOTE — Assessment & Plan Note (Signed)
Controlled, see phone note about altitude sickness proph.

## 2010-09-16 NOTE — Telephone Encounter (Signed)
Left message on voicemail for patient to call back. 

## 2010-09-17 ENCOUNTER — Telehealth: Payer: Self-pay | Admitting: *Deleted

## 2010-09-17 NOTE — Telephone Encounter (Signed)
Opened in error

## 2010-09-17 NOTE — Telephone Encounter (Signed)
Patient returned call, was notified of medication that was called in. And was given instructions.

## 2010-09-17 NOTE — Telephone Encounter (Signed)
Patient notified

## 2010-09-17 NOTE — Telephone Encounter (Signed)
Left message for patient to call.

## 2010-09-27 ENCOUNTER — Encounter: Payer: Self-pay | Admitting: Family Medicine

## 2010-10-03 ENCOUNTER — Telehealth: Payer: Self-pay | Admitting: *Deleted

## 2010-10-03 NOTE — Telephone Encounter (Signed)
I'll address the hard copy.  

## 2010-10-03 NOTE — Telephone Encounter (Signed)
Pt has faxed you a note regarding his caduet.  He either needs a script for caduet 10/20 sent to [facility], specifying brand only, or he needs two separate scripts for generics sent in. Note is on your desk.

## 2010-10-04 ENCOUNTER — Telehealth: Payer: Self-pay | Admitting: Family Medicine

## 2010-10-04 MED ORDER — AMLODIPINE BESYLATE 10 MG PO TABS: 10.0000 mg | ORAL_TABLET | Freq: Every day | ORAL | Status: DC

## 2010-10-04 MED ORDER — ATORVASTATIN CALCIUM 20 MG PO TABS: 20.0000 mg | ORAL_TABLET | Freq: Every day | ORAL | Status: DC

## 2010-10-04 NOTE — Telephone Encounter (Signed)
Advised pt that generic norvasc and lipitor scripts sent to [facility] to replace caduet.  Letter from pt placed up front for scanning.

## 2010-10-04 NOTE — Telephone Encounter (Signed)
Pt had asked to switch to generic meds, since brand name caduet 10/20 wasn't available. New rxs sent.

## 2010-10-07 ENCOUNTER — Telehealth: Payer: Self-pay | Admitting: *Deleted

## 2010-10-07 NOTE — Telephone Encounter (Signed)
Opened in error

## 2010-12-16 LAB — BASIC METABOLIC PANEL
Chloride: 101
Creatinine, Ser: 1.04
GFR calc Af Amer: >60
Potassium: 3.5
Sodium: 136

## 2010-12-16 LAB — POCT CARDIAC MARKERS: Myoglobin, poc: 60.6

## 2010-12-16 LAB — URINALYSIS, ROUTINE W REFLEX MICROSCOPIC
Bilirubin Urine: NEGATIVE
Glucose, UA: NEGATIVE
Specific Gravity, Urine: 1.013
pH: 7

## 2010-12-16 LAB — CBC
HCT: 40.7
MCV: 86.7
RBC: 4.7
WBC: 13.5 — ABNORMAL HIGH

## 2010-12-16 LAB — DIFFERENTIAL
Eosinophils Absolute: 0.2
Lymphs Abs: 1.6
Monocytes Relative: 5
Neutrophils Relative %: 81 — ABNORMAL HIGH

## 2010-12-16 LAB — URINE MICROSCOPIC-ADD ON

## 2011-03-27 ENCOUNTER — Other Ambulatory Visit: Payer: Self-pay

## 2011-03-27 MED ORDER — CIPROFLOXACIN HCL 500 MG PO TABS: 500.0000 mg | ORAL_TABLET | Freq: Two times a day (BID) | ORAL | Status: DC

## 2011-03-27 NOTE — Telephone Encounter (Signed)
Pt goes to [place] on mission trip Apr 11 2011 and would like Dr Para March to give Cipro rx in case needed while in [place]. Pt would [facility] [address] but if Dr Para March needs to see pt he will make appt. Please advise. Pt can be reached at [phone].

## 2011-03-27 NOTE — Telephone Encounter (Signed)
LMOVM

## 2011-03-27 NOTE — Telephone Encounter (Signed)
rx sent.  Notify pt.  Thanks.

## 2011-09-02 ENCOUNTER — Other Ambulatory Visit: Payer: Self-pay

## 2011-09-02 MED ORDER — TRAMADOL HCL 50 MG PO TABS: 50.0000 mg | ORAL_TABLET | Freq: Three times a day (TID) | ORAL | Status: DC | PRN

## 2011-09-02 NOTE — Telephone Encounter (Signed)
Sent!

## 2011-09-02 NOTE — Telephone Encounter (Signed)
Pt request  30 day refill tramadol sent to [facility] [address]; Pt takes 3 tabs daily;#90. Pt already has CPX 09/16/11 and will take 2 weeks to get med from mail order pharmacy.Please advise.

## 2011-09-08 ENCOUNTER — Other Ambulatory Visit: Payer: Self-pay | Admitting: Family Medicine

## 2011-09-08 DIAGNOSIS — N400 Enlarged prostate without lower urinary tract symptoms: Secondary | ICD-10-CM

## 2011-09-08 DIAGNOSIS — E7800 Pure hypercholesterolemia, unspecified: Secondary | ICD-10-CM

## 2011-09-10 ENCOUNTER — Other Ambulatory Visit (INDEPENDENT_AMBULATORY_CARE_PROVIDER_SITE_OTHER): Payer: 59

## 2011-09-10 DIAGNOSIS — E7800 Pure hypercholesterolemia, unspecified: Secondary | ICD-10-CM

## 2011-09-10 DIAGNOSIS — N400 Enlarged prostate without lower urinary tract symptoms: Secondary | ICD-10-CM

## 2011-09-10 LAB — PSA: PSA: 3.45 ng/mL (ref 0.10–4.00)

## 2011-09-10 LAB — COMPREHENSIVE METABOLIC PANEL
ALT: 16 U/L (ref 0–53)
BUN: 11 mg/dL (ref 6–23)
CO2: 27 mEq/L (ref 19–32)
Creatinine, Ser: 0.8 mg/dL (ref 0.4–1.5)
GFR: 104.23 mL/min (ref >60.00)
Glucose, Bld: 92 mg/dL (ref 70–99)
Total Bilirubin: 0.7 mg/dL (ref 0.3–1.2)

## 2011-09-10 LAB — LIPID PANEL
Cholesterol: 215 mg/dL — ABNORMAL HIGH (ref 0–200)
Total CHOL/HDL Ratio: 5
Triglycerides: 142 mg/dL (ref 0.0–149.0)

## 2011-09-10 LAB — LDL CHOLESTEROL, DIRECT: Direct LDL: 135.6 mg/dL

## 2011-09-16 ENCOUNTER — Ambulatory Visit (INDEPENDENT_AMBULATORY_CARE_PROVIDER_SITE_OTHER): Payer: 59 | Admitting: Family Medicine

## 2011-09-16 ENCOUNTER — Encounter: Payer: Self-pay | Admitting: Family Medicine

## 2011-09-16 VITALS — BP 134/80 | HR 70 | Temp 98.2°F | Wt 171.8 lb

## 2011-09-16 DIAGNOSIS — Z8042 Family history of malignant neoplasm of prostate: Secondary | ICD-10-CM

## 2011-09-16 DIAGNOSIS — Z0000 Encounter for general adult medical examination without abnormal findings: Secondary | ICD-10-CM

## 2011-09-16 DIAGNOSIS — E785 Hyperlipidemia, unspecified: Secondary | ICD-10-CM

## 2011-09-16 DIAGNOSIS — I10 Essential (primary) hypertension: Secondary | ICD-10-CM

## 2011-09-16 DIAGNOSIS — F4329 Adjustment disorder with other symptoms: Secondary | ICD-10-CM

## 2011-09-16 MED ORDER — TRAMADOL HCL 50 MG PO TABS: 50.0000 mg | ORAL_TABLET | Freq: Four times a day (QID) | ORAL | Status: DC | PRN

## 2011-09-16 MED ORDER — AMLODIPINE BESYLATE 10 MG PO TABS: 10.0000 mg | ORAL_TABLET | Freq: Every day | ORAL | Status: DC

## 2011-09-16 MED ORDER — CELECOXIB 200 MG PO CAPS: 200.0000 mg | ORAL_CAPSULE | Freq: Every day | ORAL | Status: DC

## 2011-09-16 MED ORDER — TRAZODONE HCL 50 MG PO TABS: 25.0000 mg | ORAL_TABLET | Freq: Every evening | ORAL | Status: DC | PRN

## 2011-09-16 MED ORDER — OMEPRAZOLE 20 MG PO CPDR: 20.0000 mg | DELAYED_RELEASE_CAPSULE | Freq: Every day | ORAL | Status: DC

## 2011-09-16 MED ORDER — ATORVASTATIN CALCIUM 20 MG PO TABS: 20.0000 mg | ORAL_TABLET | Freq: Every day | ORAL | Status: DC

## 2011-09-16 NOTE — Patient Instructions (Addendum)
Check your living will.  We can scan it if needed.  I would get a flu shot each fall.   I would get a pneumonia shot next year.   Try the trazodone at night for sleep. Let me know if it doesn't help.  Take care.  Glad to see you.

## 2011-09-17 DIAGNOSIS — Z8042 Family history of malignant neoplasm of prostate: Secondary | ICD-10-CM | POA: Insufficient documentation

## 2011-09-17 DIAGNOSIS — Z0000 Encounter for general adult medical examination without abnormal findings: Secondary | ICD-10-CM | POA: Insufficient documentation

## 2011-09-17 NOTE — Assessment & Plan Note (Signed)
Controlled, continue current meds.   

## 2011-09-17 NOTE — Assessment & Plan Note (Signed)
Insomnia is likely related.  Try trazodone at night and he'll call back with update.  Okay for outpatient fu.  His mood is good and affect is wnl today.

## 2011-09-17 NOTE — Assessment & Plan Note (Signed)
Restart statin, he didn't have ADE.  It would be reasonable to treat given his history of HTN and prev response to statin.

## 2011-09-17 NOTE — Progress Notes (Signed)
I have personally reviewed the Medicare Annual Wellness questionnaire and have noted 1. The patient's medical and social history 2. Their use of alcohol, tobacco or illicit drugs 3. Their current medications and supplements 4. The patient's functional ability including ADL's, fall risks, home safety risks and hearing or visual             impairment. 5. Diet and physical activities 6. Evidence for depression or mood disorders  The patients weight, height, BMI have been recorded in the chart and visual acuity is per eye clinic.  I have made referrals, counseling and provided education to the patient based review of the above and I have provided the pt with a written personalized care plan for preventive services.  See scanned forms.  Routine anticipatory guidance given to patient.  See health maintenance. Tetanus 2008 Flu yearly Shingles 2009 PNA 2008 Colonoscopy 2008 Prostate cancer screening- PSA wnl.   Advance directive d/w pt.  He has living will.   Elevated Cholesterol: Using medications without problems: stopped med, had no ADE.  We discussed.   Muscle aches: no Diet compliance:yes Exercise:yes  Hypertension:    Using medication without problems or lightheadedness: yes Chest pain with exertion:no Edema:no Short of breath:no  Insomnia.  Intermittent. Related to worry about family.  Daughter with difficult home situation and his father had also died last year.  Mother in [place].  Didn't tolerate xanax.  No illicits.  He also has 'jumpy' legs at night and will occ need to get out of bed.    PMH and SH reviewed  Meds, vitals, and allergies reviewed.   ROS: See HPI.  Otherwise negative.    GEN: nad, alert and oriented HEENT: mucous membranes moist NECK: supple w/o LA CV: rrr. PULM: ctab, no inc wob ABD: soft, +bs EXT: no edema SKIN: no acute rash DRE deferred.

## 2011-11-13 ENCOUNTER — Encounter: Payer: Self-pay | Admitting: Family Medicine

## 2011-11-13 ENCOUNTER — Ambulatory Visit (INDEPENDENT_AMBULATORY_CARE_PROVIDER_SITE_OTHER): Payer: 59 | Admitting: Family Medicine

## 2011-11-13 VITALS — BP 114/84 | HR 85 | Temp 98.4°F | Wt 173.0 lb

## 2011-11-13 DIAGNOSIS — I10 Essential (primary) hypertension: Secondary | ICD-10-CM

## 2011-11-13 DIAGNOSIS — G4700 Insomnia, unspecified: Secondary | ICD-10-CM

## 2011-11-13 DIAGNOSIS — F4329 Adjustment disorder with other symptoms: Secondary | ICD-10-CM

## 2011-11-13 MED ORDER — DIAZEPAM 5 MG PO TABS: 2.5000 mg | ORAL_TABLET | Freq: Two times a day (BID) | ORAL | Status: AC | PRN

## 2011-11-13 MED ORDER — METHOCARBAMOL 500 MG PO TABS: 250.0000 mg | ORAL_TABLET | Freq: Every evening | ORAL | Status: DC | PRN

## 2011-11-13 NOTE — Patient Instructions (Addendum)
I would get a flu shot each fall.   Use the valium as needed.  Sedation caution.  That should help some.  Take care.

## 2011-11-13 NOTE — Progress Notes (Signed)
Insomnia controlled with 1/2 tab of robaxin at night.  This has worked better than any other med.  Didn't tolerate the trazodone.  Social stressors with family continue.    Was at a football game recent and a nearby fan was reportedly behaving poorly, hollering during the game.  Pt stood up quickly to ask him to be quiet and felt faint but didn't pass out.  This had happened prev.  He had seen Dr. Earnstine Regal with cards and w/u was unremarkable.  No CP, SOB with the event.    He's been more moody recently and was asking about options. He isn't violent, suicidal or homicidal.  He had tolerated valium preoperatively with dental procedures.    Meds, vitals, and allergies reviewed.   ROS: See HPI.  Otherwise, noncontributory.  GEN: nad, alert and oriented, affect wnl, speech wnl HEENT: mucous membranes moist NECK: supple w/o LA CV: rrr. PULM: ctab, no inc wob ABD: soft, +bs EXT: no edema SKIN: no acute rash

## 2011-11-16 DIAGNOSIS — G4700 Insomnia, unspecified: Secondary | ICD-10-CM | POA: Insufficient documentation

## 2011-11-16 NOTE — Assessment & Plan Note (Signed)
Was likely orthostatic with pooling due to BP meds and sitting down.  Stay hydrated, avoid sudden postural changes. Reassured pt.  Prev cards eval unremarkable with echo having no sig structural abnormalities.  He did have full syncope and this doesn't need further w/u now.

## 2011-11-16 NOTE — Assessment & Plan Note (Signed)
He'll use a low dose of prn valium and will notify me if not tolerated.  He agrees.  Sedation caution given.  >25 min spent with face to face with patient, >50% counseling and/or coordinating care.

## 2011-12-31 ENCOUNTER — Ambulatory Visit (INDEPENDENT_AMBULATORY_CARE_PROVIDER_SITE_OTHER): Payer: 59

## 2011-12-31 DIAGNOSIS — Z23 Encounter for immunization: Secondary | ICD-10-CM

## 2012-04-02 ENCOUNTER — Telehealth: Payer: Self-pay | Admitting: Family Medicine

## 2012-04-02 NOTE — Telephone Encounter (Signed)
[facility] Triage Call Report Triage Record Num: [id number] Operator: Cornell Barman Patient Name: [name] Call Date & Time: 04/02/2012 2:49:05PM Patient Phone: [phone] PCP: Patient Gender: Male PCP Fax : Patient DOB: 11-15-1945 Practice Name: [facility] Reason for Call: Caller: Jayion/Patient; Phone: [phone]; Reason for Call: Going on a Mission Trip to [place] on 04/16/12. He needs immunization for Typim VI injection. Advised that travel Immunization are not kept at the office and he should contact the Travel Nurse. He states that there is an alternative to the Typim VI that is 4 capsules for Typhoid. He also needs a Rx medication Chloroquine for Malaria. Advised that I would send his concerns and request to the office for follow up. PLEASE CONTACT PATIENT . Protocol(s) Used: Office Note Recommended Outcome per Protocol: Information Noted and Sent to Office Reason for Outcome: Caller information to office Care Advice:

## 2012-04-04 MED ORDER — TYPHOID VACCINE PO CPDR: 1.0000 | DELAYED_RELEASE_CAPSULE | ORAL | Status: DC

## 2012-04-04 MED ORDER — CHLOROQUINE PHOSPHATE 500 MG PO TABS: ORAL_TABLET | ORAL | Status: DC

## 2012-04-04 NOTE — Telephone Encounter (Signed)
Call in the chloroquine.  He'll need to take 1 tablet weekly, begin 2 weeks prior to travel and continue until 4 weeks after travel.  Make sure 10 pills will cover the duration.   Oral typhoid vaccine sent.  Please update the vaccine list- oral vaccine rx sent.

## 2012-04-05 NOTE — Telephone Encounter (Signed)
Patient advised.

## 2012-07-12 ENCOUNTER — Telehealth: Payer: Self-pay | Admitting: Family Medicine

## 2012-07-12 NOTE — Telephone Encounter (Signed)
Patient Information:  Caller Name: Rhylen  Phone: [phone]  Patient: Courage, Biglow  Gender: Male  DOB: 11/29/1945  Age: 67 Years  PCP: Crawford Givens Clelia Croft) [facility])  Office Follow Up:  Does the office need to follow up with this patient?: Yes  Instructions For The Office: Needing appointment to be seen today or antibiotic called in to pharmacy - has tick bite with red ring. All appointments are full.   Symptoms  Reason For Call & Symptoms: Tick pulled out of R leg on 07/07/12. Bite has small scab and pink dime sized area around it.Marland Kitchen Has faint quarter sized pink ring around bite.. Afebrile.  Reviewed Health History In EMR: Yes  Reviewed Medications In EMR: Yes  Reviewed Allergies In EMR: Yes  Reviewed Surgeries / Procedures: Yes  Date of Onset of Symptoms: 07/07/2012  Treatments Tried: Applied alcohol and polysporin for several days  Treatments Tried Worked: No  Guideline(s) Used:  Tick Bite  Disposition Per Guideline:   See Today in Office  Reason For Disposition Reached:   Red ring or bull's-eye rash occurs around a deer tick bite  Advice Given:  Antibiotic Ointment:  Wash the wound and your hands with soap and water after removal to prevent catching any tick disease. Apply an over-the-counter antibiotic ointment (e.g., bacitracin) to the bite once.  Call Back If:  You can't remove the tick or the tick's head  Fever or rash occur in the next 2 weeks  Bite begins to look infected  You become worse.  Patient Will Follow Care Advice:  YES

## 2012-07-12 NOTE — Telephone Encounter (Signed)
Appt scheduled

## 2012-07-12 NOTE — Telephone Encounter (Signed)
I called the patient.  He has no systemic symptoms, no fever.  He has a small pink area around the bite site, about the size of a dime.  The tick wasn't engorged.  He'll mark the border and we'll check him tomorrow.  I wouldn't start abx yet.  We discussed.   [name]- there is an open slot at [facility].  He can come then.  Please add him to the schedule.  Thanks.

## 2012-07-13 ENCOUNTER — Ambulatory Visit (INDEPENDENT_AMBULATORY_CARE_PROVIDER_SITE_OTHER): Payer: Medicare Other | Admitting: Family Medicine

## 2012-07-13 ENCOUNTER — Encounter: Payer: Self-pay | Admitting: Family Medicine

## 2012-07-13 VITALS — BP 116/74 | HR 74 | Temp 98.1°F | Wt 167.5 lb

## 2012-07-13 DIAGNOSIS — W57XXXA Bitten or stung by nonvenomous insect and other nonvenomous arthropods, initial encounter: Secondary | ICD-10-CM

## 2012-07-13 DIAGNOSIS — T148 Other injury of unspecified body region: Secondary | ICD-10-CM

## 2012-07-13 MED ORDER — DOXYCYCLINE HYCLATE 100 MG PO TABS: 100.0000 mg | ORAL_TABLET | Freq: Two times a day (BID) | ORAL | Status: DC

## 2012-07-13 NOTE — Progress Notes (Signed)
He had f/u with ortho about his back. His back pain is improved.  He was Dr. Shon Baton and went to PT. Requesting records.    He had a question about prev billing and I asked him to talk with our office manager.    Recent tick bite.  Removed on 07/07/12. It was redder initially and then has become pinker in the interval. 1.5cm pink area now with central bite site on the R thigh. No fevers.  No other rash o/w.  No other systemic sx. H/o GI intolerance with prolonged doxy use.  H/o tick associated illness prev but this had resolved.  He feels well now.   Meds, vitals, and allergies reviewed.   ROS: See HPI.  Otherwise, noncontributory.  GEN: nad, alert and oriented HEENT: mucous membranes moist NECK: supple w/o LA CV: rrr.  no murmur PULM: ctab, no inc wob ABD: soft, +bs EXT: no edema SKIN: no acute rash but there is a resolving pink area 1.5 cm across on the R thigh w/o retained FB seen under magnification.

## 2012-07-13 NOTE — Patient Instructions (Addendum)
Talk to [facility] on the way out about your previous billing.   I would expect the site on your leg to slowly resolve.  If you have fever, other joint aches, or another rash, then start the doxycycline and notify us.  Take care.

## 2012-07-14 DIAGNOSIS — W57XXXA Bitten or stung by nonvenomous insect and other nonvenomous arthropods, initial encounter: Secondary | ICD-10-CM | POA: Insufficient documentation

## 2012-07-14 NOTE — Assessment & Plan Note (Signed)
The local reaction appears to be resolving. We talked about options.  He feels well and has h/o GI intol with doxy use.  Would hold rx for now as this will likely resolve w/o complications.  The tick wasn't engorged.  If he has other sx, then start the doxy and notify clinic.  He agrees.

## 2012-08-30 ENCOUNTER — Other Ambulatory Visit: Payer: Self-pay | Admitting: Family Medicine

## 2012-08-30 NOTE — Telephone Encounter (Signed)
Pt not available, spoke with pts wife and [facility]. Mrs Matheson is not sure which other meds pt needs and will contact pharmacy if refills needed pharmacy will contact office.

## 2012-08-30 NOTE — Telephone Encounter (Signed)
Patient cannot get in for his CPE until the end of August.  He was wondering if we can refill his Tramadol for him until this appointment.  Also, he will run out of his other medications as well.  Please cal the patient.  [phone]

## 2012-08-31 MED ORDER — TRAMADOL HCL 50 MG PO TABS: 50.0000 mg | ORAL_TABLET | Freq: Four times a day (QID) | ORAL | Status: DC | PRN

## 2012-08-31 NOTE — Telephone Encounter (Signed)
Sent!

## 2012-08-31 NOTE — Telephone Encounter (Signed)
Tramadol sent, await update on other meds. Thanks.

## 2012-08-31 NOTE — Telephone Encounter (Signed)
Patient advised.

## 2012-08-31 NOTE — Telephone Encounter (Signed)
Pt left v/m requesting 30 day refill Tr[facility] [address] mail order med may not reach pt before he leaves on a trip.Please advise. Pt request cb.

## 2012-10-06 ENCOUNTER — Other Ambulatory Visit: Payer: Self-pay | Admitting: Family Medicine

## 2012-10-06 NOTE — Telephone Encounter (Signed)
Has tolerated valium, please call in.  Thanks.

## 2012-10-06 NOTE — Telephone Encounter (Signed)
Received refill request electronically.Medication is not on med sheet. See allergy/contraindication. Last office visit 07/13/12. Is it okay to refill medication?

## 2012-10-07 NOTE — Telephone Encounter (Signed)
Medication phoned to pharmacy.  

## 2012-10-21 ENCOUNTER — Encounter: Payer: Self-pay | Admitting: Radiology

## 2012-10-22 ENCOUNTER — Other Ambulatory Visit (INDEPENDENT_AMBULATORY_CARE_PROVIDER_SITE_OTHER): Payer: Medicare Other

## 2012-10-22 ENCOUNTER — Other Ambulatory Visit: Payer: Self-pay | Admitting: Family Medicine

## 2012-10-22 DIAGNOSIS — Z125 Encounter for screening for malignant neoplasm of prostate: Secondary | ICD-10-CM

## 2012-10-22 DIAGNOSIS — I10 Essential (primary) hypertension: Secondary | ICD-10-CM

## 2012-10-22 LAB — COMPREHENSIVE METABOLIC PANEL
ALT: 13 U/L (ref 0–53)
AST: 18 U/L (ref 0–37)
Albumin: 4 g/dL (ref 3.5–5.2)
CO2: 28 mEq/L (ref 19–32)
Calcium: 9 mg/dL (ref 8.4–10.5)
Chloride: 103 mEq/L (ref 96–112)
GFR: 125.62 mL/min (ref >60.00)
Potassium: 4.1 mEq/L (ref 3.5–5.1)
Sodium: 137 mEq/L (ref 135–145)
Total Protein: 6.8 g/dL (ref 6.0–8.3)

## 2012-10-22 LAB — LIPID PANEL
HDL: 35.6 mg/dL — ABNORMAL LOW (ref >39.00)
Total CHOL/HDL Ratio: 5
Triglycerides: 128 mg/dL (ref 0.0–149.0)
VLDL: 25.6 mg/dL (ref 0.0–40.0)

## 2012-10-29 ENCOUNTER — Encounter: Payer: Self-pay | Admitting: Family Medicine

## 2012-10-29 ENCOUNTER — Ambulatory Visit (INDEPENDENT_AMBULATORY_CARE_PROVIDER_SITE_OTHER): Payer: Medicare Other | Admitting: Family Medicine

## 2012-10-29 VITALS — BP 106/70 | HR 71 | Temp 98.0°F | Ht 67.0 in | Wt 168.2 lb

## 2012-10-29 DIAGNOSIS — Z125 Encounter for screening for malignant neoplasm of prostate: Secondary | ICD-10-CM

## 2012-10-29 DIAGNOSIS — Z0000 Encounter for general adult medical examination without abnormal findings: Secondary | ICD-10-CM

## 2012-10-29 DIAGNOSIS — F4329 Adjustment disorder with other symptoms: Secondary | ICD-10-CM

## 2012-10-29 DIAGNOSIS — G4700 Insomnia, unspecified: Secondary | ICD-10-CM

## 2012-10-29 DIAGNOSIS — E7800 Pure hypercholesterolemia, unspecified: Secondary | ICD-10-CM

## 2012-10-29 DIAGNOSIS — E785 Hyperlipidemia, unspecified: Secondary | ICD-10-CM

## 2012-10-29 DIAGNOSIS — I10 Essential (primary) hypertension: Secondary | ICD-10-CM

## 2012-10-29 DIAGNOSIS — M159 Polyosteoarthritis, unspecified: Secondary | ICD-10-CM

## 2012-10-29 DIAGNOSIS — Z23 Encounter for immunization: Secondary | ICD-10-CM

## 2012-10-29 DIAGNOSIS — Z87898 Personal history of other specified conditions: Secondary | ICD-10-CM

## 2012-10-29 MED ORDER — AMLODIPINE BESYLATE 10 MG PO TABS: 10.0000 mg | ORAL_TABLET | Freq: Every day | ORAL | Status: DC

## 2012-10-29 MED ORDER — PRAVASTATIN SODIUM 20 MG PO TABS: 20.0000 mg | ORAL_TABLET | Freq: Every day | ORAL | Status: DC

## 2012-10-29 MED ORDER — TRAMADOL HCL 50 MG PO TABS: 100.0000 mg | ORAL_TABLET | Freq: Three times a day (TID) | ORAL | Status: DC | PRN

## 2012-10-29 NOTE — Progress Notes (Signed)
I have personally reviewed the Medicare Annual Wellness questionnaire and have noted 1. The patient's medical and social history 2. Their use of alcohol, tobacco or illicit drugs 3. Their current medications and supplements 4. The patient's functional ability including ADL's, fall risks, home safety risks and hearing or visual             impairment. 5. Diet and physical activities 6. Evidence for depression or mood disorders  The patients weight, height, BMI have been recorded in the chart and visual acuity is per eye clinic.  I have made referrals, counseling and provided education to the patient based review of the above and I have provided the pt with a written personalized care plan for preventive services.  See scanned forms.  Routine anticipatory guidance given to patient.  See health maintenance. Flu yearly Shingles 2009 PNA 2014 Tetanus 2008 Colonoscopy 2008 Prostate cancer screening PSA discussion.  Advance directive- wife is designated if he is incapacitated, then step daughter next.  Cognitive function addressed- see scanned forms- and if abnormal then additional documentation follows.   Back pain- no new change in sx, ie no weakness. Controlled with 6 tramadol a day.  No ADE.  Still active and functional with that dose of med.  Adjustment with grandkids back in school.  Sig change for him.  They had a happy summer together.    Elevated PSA- neg bx prev.  No LUTS.  H/o enlarged prostate. Discussed.   Elevated Cholesterol: Using medications without problems: aches on lipitos Muscle aches: see above, resolved off med Diet compliance:yes Exercise:yes  Hypertension:    Using medication without problems or lightheadedness: y Chest pain with exertion:n Edema:n Short of breath:n  Jumpy sensation in legs at night, after a busy day.  Improved with compression stockings.  Father had similar sx.    PMH and SH reviewed  Meds, vitals, and allergies reviewed.   ROS: See  HPI.  Otherwise negative.    GEN: nad, alert and oriented HEENT: mucous membranes moist NECK: supple w/o LA CV: rrr. PULM: ctab, no inc wob ABD: soft, +bs EXT: no edema SKIN: no acute rash Prostate gland firm and smooth, symmetric enlargement but no nodularity, tenderness, mass, asymmetry or induration.

## 2012-10-29 NOTE — Patient Instructions (Addendum)
I would get a flu shot each fall.    Recheck PSA and lipids in 3 months.  Fasting visit. You can walk it at the [place] lab on [address]. We'll go from there.   Start pravastatin.  If you have aches on that, then stop it and let me know.  Take care.

## 2012-10-30 ENCOUNTER — Encounter: Payer: Self-pay | Admitting: Family Medicine

## 2012-10-30 NOTE — Assessment & Plan Note (Signed)
Continue current meds. Controlled.

## 2012-10-30 NOTE — Assessment & Plan Note (Signed)
Try prava, stop if aches.  He agrees.  Continue diet and exercise.

## 2012-10-30 NOTE — Assessment & Plan Note (Signed)
Continue tramadol for back pain with diazepam for muscle spasms. Doing well w/o ADE.  He may have a component of RLS, but can treat with compression stockings prn.  He agrees.

## 2012-10-30 NOTE — Assessment & Plan Note (Signed)
Related to change in schedule, with GKs back in school now.  He is working through the change.

## 2012-10-30 NOTE — Assessment & Plan Note (Signed)
See scanned forms.  Routine anticipatory guidance given to patient.  See health maintenance. Flu yearly Shingles 2009 PNA 2014 Tetanus 2008 Colonoscopy 2008 Prostate cancer screening PSA discussion.  Advance directive- wife is designated if he is incapacitated, then step daughter next.  Cognitive function addressed- see scanned forms- and if abnormal then additional documentation follows.

## 2012-10-30 NOTE — Assessment & Plan Note (Signed)
No LUTS.  Elevated psa, neg bx prev.  Fh prostate cancer.  Recheck PSA in about 3 months.  We'll go from there, possible referral back to uro.  He agrees.

## 2012-11-12 ENCOUNTER — Encounter: Payer: Self-pay | Admitting: Family Medicine

## 2013-01-01 ENCOUNTER — Emergency Department (HOSPITAL_BASED_OUTPATIENT_CLINIC_OR_DEPARTMENT_OTHER)
Admission: EM | Admit: 2013-01-01 | Discharge: 2013-01-01 | Disposition: A | Discharge status: Home / Self Care | Payer: Medicare Other | Attending: Emergency Medicine | Admitting: Emergency Medicine

## 2013-01-01 ENCOUNTER — Encounter (HOSPITAL_BASED_OUTPATIENT_CLINIC_OR_DEPARTMENT_OTHER): Payer: Self-pay | Admitting: Emergency Medicine

## 2013-01-01 DIAGNOSIS — H9209 Otalgia, unspecified ear: Secondary | ICD-10-CM | POA: Insufficient documentation

## 2013-01-01 DIAGNOSIS — Z8619 Personal history of other infectious and parasitic diseases: Secondary | ICD-10-CM | POA: Insufficient documentation

## 2013-01-01 DIAGNOSIS — Z7982 Long term (current) use of aspirin: Secondary | ICD-10-CM | POA: Insufficient documentation

## 2013-01-01 DIAGNOSIS — Z79899 Other long term (current) drug therapy: Secondary | ICD-10-CM | POA: Insufficient documentation

## 2013-01-01 DIAGNOSIS — Z87891 Personal history of nicotine dependence: Secondary | ICD-10-CM | POA: Insufficient documentation

## 2013-01-01 DIAGNOSIS — Z87442 Personal history of urinary calculi: Secondary | ICD-10-CM | POA: Insufficient documentation

## 2013-01-01 DIAGNOSIS — E785 Hyperlipidemia, unspecified: Secondary | ICD-10-CM | POA: Insufficient documentation

## 2013-01-01 DIAGNOSIS — Z87448 Personal history of other diseases of urinary system: Secondary | ICD-10-CM | POA: Insufficient documentation

## 2013-01-01 DIAGNOSIS — Z791 Long term (current) use of non-steroidal anti-inflammatories (NSAID): Secondary | ICD-10-CM | POA: Insufficient documentation

## 2013-01-01 DIAGNOSIS — K219 Gastro-esophageal reflux disease without esophagitis: Secondary | ICD-10-CM | POA: Insufficient documentation

## 2013-01-01 DIAGNOSIS — Z85828 Personal history of other malignant neoplasm of skin: Secondary | ICD-10-CM | POA: Insufficient documentation

## 2013-01-01 DIAGNOSIS — J329 Chronic sinusitis, unspecified: Secondary | ICD-10-CM

## 2013-01-01 DIAGNOSIS — Z8659 Personal history of other mental and behavioral disorders: Secondary | ICD-10-CM | POA: Insufficient documentation

## 2013-01-01 DIAGNOSIS — M159 Polyosteoarthritis, unspecified: Secondary | ICD-10-CM | POA: Insufficient documentation

## 2013-01-01 DIAGNOSIS — I10 Essential (primary) hypertension: Secondary | ICD-10-CM | POA: Insufficient documentation

## 2013-01-01 MED ORDER — AMOXICILLIN-POT CLAVULANATE 875-125 MG PO TABS: 1.0000 | ORAL_TABLET | Freq: Two times a day (BID) | ORAL | Status: DC

## 2013-01-01 NOTE — ED Notes (Signed)
Pt sts started with a cold on Tuesday, worsening, lots of chest congestion and throat irritation, pain behind nose and into left ear, lots of green mucous.

## 2013-01-01 NOTE — ED Provider Notes (Signed)
CSN: [id number]     Arrival date & time 01/01/13  1425 History   First MD Initiated Contact with Patient 01/01/13 1600     Chief Complaint  Patient presents with  . Facial Pain   (Consider location/radiation/quality/duration/timing/severity/associated sxs/prior Treatment) Patient is a 67 y.o. male presenting with cough. The history is provided by the patient. No language interpreter was used.  Cough Cough characteristics:  Productive Sputum characteristics:  Nondescript Severity:  Moderate Timing:  Constant Progression:  Worsening Chronicity:  New Smoker: no   Relieved by:  Nothing Worsened by:  Nothing tried Associated symptoms: ear pain and sore throat   Pt complains of a cough and congestion.  Pt reports his ear is hurting and throat is sore  Past Medical History  Diagnosis Date  . Syncope     negative cardiac w/u per Dr Eldridge Dace  . OA (osteoarthritis)     with r hip replacement  . Nephrolithiasis   . Tick bite 2011    likely tick associated illness, treated with doxy with resolution,  . GERD (gastroesophageal reflux disease)   . Viral exanthem, unspecified   . Osteoarthrosis involving, or with mention of more than one site, but not specified as generalized, site unspecified   . History of BPH   . Hyperlipidemia   . Hypertension   . Adjustment reaction with physical symptoms   . Elevated PSA     h/o with normal bx per URO  . Cancer, skin, squamous cell     R shoulder, removed per derm 2012   Past Surgical History  Procedure Laterality Date  . Total hip arthroplasty      right  . Achilles tendon repair  1984  . Shoulder arthroscopy  2012    R rotator cuff (biceps tear not repaired)   Family History  Problem Relation Age of Onset  . Prostate cancer Maternal Uncle   . Colon cancer Neg Hx    History  Substance Use Topics  . Smoking status: Former Games developer  . Smokeless tobacco: Not on file  . Alcohol Use: No    Review of Systems  HENT: Positive for ear  pain, sinus pressure and sore throat.   Respiratory: Positive for cough.   All other systems reviewed and are negative.    Allergies  Alprazolam; Doxycycline; Ibuprofen; Lipitor; and Zolpidem tartrate  Home Medications   Current Outpatient Rx  Name  Route  Sig  Dispense  Refill  . amoxicillin (AMOXIL) 500 MG capsule   Oral   Take 500 mg by mouth 3 (three) times daily. Sts has them for before dental appointment - took one every four hours (midnight, 4am, 8am, 1pm) today         . acetaminophen (TYLENOL) 500 MG tablet   Oral   Take 500 mg by mouth every 6 (six) hours as needed.           Marland Kitchen amLODipine (NORVASC) 10 MG tablet   Oral   Take 1 tablet (10 mg total) by mouth daily.   90 tablet   3   . amoxicillin-clavulanate (AUGMENTIN) 875-125 MG per tablet   Oral   Take 1 tablet by mouth every 12 (twelve) hours.   20 tablet   0   . aspirin 81 MG tablet   Oral   Take 81 mg by mouth daily.           . celecoxib (CELEBREX) 200 MG capsule   Oral   Take 1 capsule (200 mg  total) by mouth daily.   90 capsule   3   . diazepam (VALIUM) 5 MG tablet      TAKE 1/2 TO 1 TABLET BY MOUTH EVERY 12 HOURS AS NEEDED FOR ANXIETY   30 tablet   1   . glucosamine-chondroitin 500-400 MG tablet   Oral   Take 1 tablet by mouth daily.         . Multiple Vitamin (MULTIVITAMIN) tablet   Oral   Take 1 tablet by mouth daily.         Marland Kitchen omeprazole (PRILOSEC) 20 MG capsule   Oral   Take 1 capsule (20 mg total) by mouth daily.   90 capsule   3   . pravastatin (PRAVACHOL) 20 MG tablet   Oral   Take 1 tablet (20 mg total) by mouth daily.   90 tablet   3   . traMADol (ULTRAM) 50 MG tablet   Oral   Take 2 tablets (100 mg total) by mouth every 8 (eight) hours as needed for pain.   540 tablet   1    BP 134/85  Pulse 98  Temp(Src) 97.7 F (36.5 C) (Oral)  Resp 20  Wt 165 lb (74.844 kg)  BMI 25.84 kg/m2  SpO2 99% Physical Exam  Vitals reviewed. Constitutional: He is  oriented to person, place, and time. He appears well-developed and well-nourished.  HENT:  Head: Normocephalic.  Right Ear: External ear normal.  Left Ear: External ear normal.  Tender maxillary sinuses to palpation  Eyes: Conjunctivae are normal. Pupils are equal, round, and reactive to light.  Neck: Normal range of motion. Neck supple.  Cardiovascular: Normal rate.   Pulmonary/Chest: Effort normal.  Abdominal: Soft.  Musculoskeletal: Normal range of motion.  Neurological: He is alert and oriented to person, place, and time.  Skin: Skin is warm.  Psychiatric: He has a normal mood and affect.    ED Course  Procedures (including critical care time) Labs Review Labs Reviewed - No data to display Imaging Review No results found.  EKG Interpretation   None       MDM   1. Sinus infection    Rx for Augmentin.   Pt advised to continue mucinex and symptomatic care.    Lonia Skinner [place], PA-C 01/01/13 1708

## 2013-01-02 NOTE — ED Provider Notes (Signed)
Medical screening examination/treatment/procedure(s) were performed by non-physician practitioner and as supervising physician I was immediately available for consultation/collaboration.  EKG Interpretation   None         Junius Argyle, MD 01/02/13 1048

## 2013-01-03 ENCOUNTER — Telehealth: Payer: Self-pay | Admitting: Family Medicine

## 2013-01-03 NOTE — Telephone Encounter (Signed)
ER note reviewed

## 2013-01-03 NOTE — Telephone Encounter (Signed)
[facility] Triage Call Report Triage Record Num: [id number] Operator: Tomasita Crumble Patient Name: [name] Call Date & Time: 01/01/2013 1:54:32PM Patient Phone: [phone] PCP: Patient Gender: Male PCP Fax : Patient DOB: Nov 24, 1945 Practice Name: [facility] Reason for Call: Caller: [name]/Patient; PCP: Crawford Givens Clelia Croft) [facility]); CB#: [phone]; Call regarding Cough/Congestion; Reports sinus pain and left ear pain onset 12/31/12. He has been taking Amoxicillin that was prescribed for prophylaxis for dental work. He has green nasal secretions. Temp no higher than 98.6 by temporal scan. Emergent symptoms ruled out. Home care and parameters for callback; see provider within 24 hours per Upper Respiratory Infection guideline due to Pressure/ pain above or below eyes, near ears and yellow-green drainage from nose or down back of throat. Caller voiced understanding and states plan to go to [facility] ED on [facility] for evaluation. Home care measures not discussed as he states he is leaving at conclusion of the call. Protocol(s) Used: Upper Respiratory Infection (URI) Recommended Outcome per Protocol: See Provider within 24 hours Reason for Outcome: Pressure/pain above or below eyes, near ears over sinuses (may also be described as fullness, worsens when bending forward, teeth or eye pain) AND yellow-green drainage from nose or down back of throat. Care Advice: ~ 01/01/2013 2:05:21PM Page 1 of 1 [id number]

## 2013-02-17 ENCOUNTER — Other Ambulatory Visit: Payer: Self-pay | Admitting: Family Medicine

## 2013-02-17 ENCOUNTER — Other Ambulatory Visit (INDEPENDENT_AMBULATORY_CARE_PROVIDER_SITE_OTHER): Payer: Medicare Other

## 2013-02-17 DIAGNOSIS — Z125 Encounter for screening for malignant neoplasm of prostate: Secondary | ICD-10-CM

## 2013-02-17 DIAGNOSIS — E7800 Pure hypercholesterolemia, unspecified: Secondary | ICD-10-CM

## 2013-02-17 DIAGNOSIS — R972 Elevated prostate specific antigen [PSA]: Secondary | ICD-10-CM

## 2013-02-17 LAB — LIPID PANEL
Cholesterol: 169 mg/dL (ref 0–200)
HDL: 36.8 mg/dL — ABNORMAL LOW (ref >39.00)
VLDL: 26.4 mg/dL (ref 0.0–40.0)

## 2013-02-17 LAB — PSA, MEDICARE: PSA: 4.61 ng/ml — ABNORMAL HIGH (ref 0.10–4.00)

## 2013-03-18 ENCOUNTER — Other Ambulatory Visit: Payer: Self-pay

## 2013-03-18 MED ORDER — CIPROFLOXACIN HCL 500 MG PO TABS: 500.0000 mg | ORAL_TABLET | Freq: Two times a day (BID) | ORAL | Status: AC

## 2013-03-18 NOTE — Telephone Encounter (Signed)
plz notify sent in. 

## 2013-03-18 NOTE — Telephone Encounter (Signed)
Pt left v/m; pt goes every Feb to [place] on mission trip; pt request refill cipro for travelers diarrhea. Leaving on 04/15/13 for [place]. [facility] [address]Please advise.

## 2013-03-18 NOTE — Telephone Encounter (Signed)
Left detailed message on voicemail.  

## 2013-03-30 ENCOUNTER — Encounter: Payer: Self-pay | Admitting: Family Medicine

## 2013-04-06 ENCOUNTER — Other Ambulatory Visit: Payer: Self-pay | Admitting: Family Medicine

## 2013-04-06 NOTE — Telephone Encounter (Signed)
Called to [facility] [address] 

## 2013-04-06 NOTE — Telephone Encounter (Signed)
Please call in.  Thanks.   

## 2013-04-06 NOTE — Telephone Encounter (Signed)
Last office visit 10/29/2012.  Ok to refill?

## 2013-05-16 ENCOUNTER — Telehealth: Payer: Self-pay

## 2013-05-16 NOTE — Telephone Encounter (Signed)
Pt left v/m wanting to know if OK for pt to take lipoflavinoid with pts other meds; pt wants to try for ringing in ears. Pt request cb

## 2013-05-16 NOTE — Telephone Encounter (Signed)
He can try it, but the evidence on it is poor. I have low expectations that it will work.

## 2013-05-17 NOTE — Telephone Encounter (Signed)
Patient advised.

## 2013-06-20 ENCOUNTER — Other Ambulatory Visit: Payer: Self-pay

## 2013-06-20 MED ORDER — TRAMADOL HCL 50 MG PO TABS: 100.0000 mg | ORAL_TABLET | Freq: Three times a day (TID) | ORAL | Status: DC | PRN

## 2013-06-20 NOTE — Telephone Encounter (Signed)
Pt left v/m pt has order form for [facility]adol [facility] and will mail to Dr [name]. Pt request 15 day supply of tramadol called to [place]. Pt request cb when refilled.

## 2013-06-20 NOTE — Telephone Encounter (Signed)
Please call in.  Thanks.   

## 2013-06-21 NOTE — Telephone Encounter (Signed)
Medication phoned to pharmacy.  

## 2013-06-27 ENCOUNTER — Other Ambulatory Visit: Payer: Self-pay | Admitting: *Deleted

## 2013-06-27 ENCOUNTER — Other Ambulatory Visit: Payer: Self-pay | Admitting: Family Medicine

## 2013-06-27 MED ORDER — TRAMADOL HCL 50 MG PO TABS: 100.0000 mg | ORAL_TABLET | Freq: Three times a day (TID) | ORAL | Status: DC | PRN

## 2013-10-03 ENCOUNTER — Other Ambulatory Visit: Payer: Self-pay | Admitting: *Deleted

## 2013-10-03 MED ORDER — CELECOXIB 200 MG PO CAPS: 200.0000 mg | ORAL_CAPSULE | Freq: Every day | ORAL | Status: DC

## 2013-10-14 ENCOUNTER — Other Ambulatory Visit: Payer: Self-pay

## 2013-10-14 NOTE — Telephone Encounter (Signed)
Pt waiting on refill celebrex from mail order; pt [facility] [address]Please advise.

## 2013-10-16 MED ORDER — CELECOXIB 200 MG PO CAPS: 200.0000 mg | ORAL_CAPSULE | Freq: Every day | ORAL | Status: DC

## 2013-10-16 NOTE — Telephone Encounter (Signed)
Sent. Thanks.   

## 2013-10-23 ENCOUNTER — Other Ambulatory Visit: Payer: Self-pay | Admitting: Family Medicine

## 2013-10-23 DIAGNOSIS — E785 Hyperlipidemia, unspecified: Secondary | ICD-10-CM

## 2013-10-27 ENCOUNTER — Other Ambulatory Visit (INDEPENDENT_AMBULATORY_CARE_PROVIDER_SITE_OTHER): Payer: Medicare Other

## 2013-10-27 DIAGNOSIS — E785 Hyperlipidemia, unspecified: Secondary | ICD-10-CM

## 2013-10-27 LAB — COMPREHENSIVE METABOLIC PANEL
ALT: 14 U/L (ref 0–53)
AST: 19 U/L (ref 0–37)
Albumin: 3.9 g/dL (ref 3.5–5.2)
Alkaline Phosphatase: 73 U/L (ref 39–117)
BUN: 11 mg/dL (ref 6–23)
CHLORIDE: 102 meq/L (ref 96–112)
CO2: 30 mEq/L (ref 19–32)
Calcium: 8.9 mg/dL (ref 8.4–10.5)
Creatinine, Ser: 0.8 mg/dL (ref 0.4–1.5)
GFR: 96.48 mL/min (ref >60.00)
Glucose, Bld: 88 mg/dL (ref 70–99)
Potassium: 4.2 mEq/L (ref 3.5–5.1)
Sodium: 138 mEq/L (ref 135–145)
Total Bilirubin: 0.6 mg/dL (ref 0.2–1.2)
Total Protein: 6.9 g/dL (ref 6.0–8.3)

## 2013-10-27 LAB — LIPID PANEL
CHOL/HDL RATIO: 4
Cholesterol: 154 mg/dL (ref 0–200)
HDL: 42.4 mg/dL (ref >39.00)
LDL CALC: 95 mg/dL (ref 0–99)
NONHDL: 111.6
Triglycerides: 83 mg/dL (ref 0.0–149.0)
VLDL: 16.6 mg/dL (ref 0.0–40.0)

## 2013-11-03 ENCOUNTER — Encounter: Payer: Self-pay | Admitting: Family Medicine

## 2013-11-03 ENCOUNTER — Ambulatory Visit (INDEPENDENT_AMBULATORY_CARE_PROVIDER_SITE_OTHER): Payer: Medicare Other | Admitting: Family Medicine

## 2013-11-03 ENCOUNTER — Other Ambulatory Visit: Payer: Self-pay | Admitting: *Deleted

## 2013-11-03 VITALS — BP 116/82 | HR 73 | Temp 98.1°F | Ht 67.0 in | Wt 165.2 lb

## 2013-11-03 DIAGNOSIS — Z87898 Personal history of other specified conditions: Secondary | ICD-10-CM

## 2013-11-03 DIAGNOSIS — I10 Essential (primary) hypertension: Secondary | ICD-10-CM

## 2013-11-03 DIAGNOSIS — Z0000 Encounter for general adult medical examination without abnormal findings: Secondary | ICD-10-CM

## 2013-11-03 DIAGNOSIS — Z7189 Other specified counseling: Secondary | ICD-10-CM

## 2013-11-03 DIAGNOSIS — W57XXXA Bitten or stung by nonvenomous insect and other nonvenomous arthropods, initial encounter: Secondary | ICD-10-CM

## 2013-11-03 DIAGNOSIS — G4700 Insomnia, unspecified: Secondary | ICD-10-CM

## 2013-11-03 DIAGNOSIS — E785 Hyperlipidemia, unspecified: Secondary | ICD-10-CM

## 2013-11-03 DIAGNOSIS — M159 Polyosteoarthritis, unspecified: Secondary | ICD-10-CM

## 2013-11-03 MED ORDER — AMLODIPINE BESYLATE 10 MG PO TABS: 10.0000 mg | ORAL_TABLET | Freq: Every day | ORAL | Status: DC

## 2013-11-03 MED ORDER — OMEPRAZOLE 20 MG PO CPDR: 20.0000 mg | DELAYED_RELEASE_CAPSULE | Freq: Every day | ORAL | Status: DC

## 2013-11-03 MED ORDER — DIAZEPAM 2 MG PO TABS: 1.0000 mg | ORAL_TABLET | Freq: Two times a day (BID) | ORAL | Status: DC | PRN

## 2013-11-03 MED ORDER — TRAMADOL HCL 50 MG PO TABS: 100.0000 mg | ORAL_TABLET | Freq: Three times a day (TID) | ORAL | Status: DC | PRN

## 2013-11-03 MED ORDER — PRAVASTATIN SODIUM 20 MG PO TABS
20.0000 mg | ORAL_TABLET | Freq: Every day | ORAL | Status: DC
Start: 2013-11-03 — End: 2014-06-29

## 2013-11-03 NOTE — Patient Instructions (Signed)
Get a flu shot this fall. Call about 1 month before the trip to [place] so we can make some plans for your meds on the trip.  Try the lower dose of diazepam at night 1/2 to 1 tab at night if needed. Take care. Glad to see you.
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
## 2013-11-04 ENCOUNTER — Encounter: Payer: Self-pay | Admitting: Family Medicine

## 2013-11-04 DIAGNOSIS — Z7189 Other specified counseling: Secondary | ICD-10-CM | POA: Insufficient documentation

## 2013-11-04 NOTE — Assessment & Plan Note (Signed)
Controlled, continue current med.  Labs d/w pt.
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
## 2013-11-04 NOTE — Assessment & Plan Note (Signed)
Now resolved.  

## 2013-11-04 NOTE — Assessment & Plan Note (Signed)
Continue prn tramadol.  No ADE.

## 2013-11-04 NOTE — Assessment & Plan Note (Signed)
Per uro.

## 2013-11-04 NOTE — Assessment & Plan Note (Addendum)
Routine anticipatory guidance given to patient.  See health maintenance. Tetanus 2008 PNA 2014 Flu shot usually done at church, deferred for now.  Shingles shot 2009 PSA per uro, d/w pt.  Colonoscopy 2008 Living will d/w pt.  Wife designated if patient were incapacitated.  He'll call back before his trip to [place] in 2016 to make plans [id number].

## 2014-03-15 ENCOUNTER — Telehealth: Payer: Self-pay

## 2014-03-15 NOTE — Telephone Encounter (Signed)
Pt left v/m; pt was seen 11/03/13; pt was advised to Call about 1 month before the trip to [place] so Dr [name] could make some plans for  meds on the trip. [facility] [address]Please advise.

## 2014-03-16 MED ORDER — CIPROFLOXACIN HCL 500 MG PO TABS
500.0000 mg | ORAL_TABLET | Freq: Two times a day (BID) | ORAL | Status: DC
Start: 2014-03-16 — End: 2014-06-29

## 2014-03-16 MED ORDER — CHLOROQUINE PHOSPHATE 500 MG PO TABS: ORAL_TABLET | ORAL | Status: DC

## 2014-03-16 NOTE — Telephone Encounter (Signed)
Chloroquine sent. He'll need to take 1 tablet weekly, begin 2 weeks prior to travel and continue until 4 weeks after travel. Make sure 10 pills will cover the duration.  Sent cipro rx to hold onto in case of traveller's diarrhea.  Make sure he had a flu shot.  Have a good trip. Thanks.

## 2014-03-16 NOTE — Telephone Encounter (Signed)
Patient advised.

## 2014-06-12 ENCOUNTER — Telehealth: Payer: Self-pay | Admitting: *Deleted

## 2014-06-12 NOTE — Telephone Encounter (Signed)
Faxed refill request. Last Filled:    540 tablet 1 RF on 11/03/2013  Please advise.

## 2014-06-13 MED ORDER — TRAMADOL HCL 50 MG PO TABS: 100.0000 mg | ORAL_TABLET | Freq: Three times a day (TID) | ORAL | Status: DC | PRN

## 2014-06-13 NOTE — Telephone Encounter (Signed)
Medication phoned to pharmacy.  

## 2014-06-13 NOTE — Telephone Encounter (Signed)
Please phone in. Thanks!

## 2014-06-14 NOTE — Telephone Encounter (Signed)
Left message on voice mail  to call back

## 2014-06-14 NOTE — Telephone Encounter (Signed)
Pt left v/m wanting to know if 30 day rx could be called to [place]. Did the [facility]adol [facility] [address]?Please advise.

## 2014-06-15 ENCOUNTER — Other Ambulatory Visit: Payer: Self-pay | Admitting: *Deleted

## 2014-06-15 MED ORDER — TRAMADOL HCL 50 MG PO TABS: 100.0000 mg | ORAL_TABLET | Freq: Three times a day (TID) | ORAL | Status: DC | PRN

## 2014-06-15 NOTE — Telephone Encounter (Signed)
Faxed to [facility]

## 2014-06-15 NOTE — Telephone Encounter (Signed)
Signed, please fax in, thanks.  I apologize.

## 2014-06-15 NOTE — Telephone Encounter (Signed)
Spoke with patient.  Rx for Tramadol was supposed to have went to [facility] and instead [facility] [place].  He is going to [facility] [place] to get him by until [facility] sends the medication.  Please sign Rx for [facility]ing to [facility].

## 2014-06-29 ENCOUNTER — Ambulatory Visit (INDEPENDENT_AMBULATORY_CARE_PROVIDER_SITE_OTHER): Payer: 59 | Admitting: Family Medicine

## 2014-06-29 ENCOUNTER — Encounter: Payer: Self-pay | Admitting: Family Medicine

## 2014-06-29 VITALS — BP 124/78 | HR 72 | Temp 98.5°F | Wt 165.0 lb

## 2014-06-29 DIAGNOSIS — M7702 Medial epicondylitis, left elbow: Secondary | ICD-10-CM | POA: Diagnosis not present

## 2014-06-29 DIAGNOSIS — G2581 Restless legs syndrome: Secondary | ICD-10-CM | POA: Diagnosis not present

## 2014-06-29 MED ORDER — DIAZEPAM 2 MG PO TABS: 1.0000 mg | ORAL_TABLET | Freq: Two times a day (BID) | ORAL | Status: DC | PRN

## 2014-06-29 MED ORDER — TRAMADOL HCL 50 MG PO TABS: 100.0000 mg | ORAL_TABLET | Freq: Three times a day (TID) | ORAL | Status: DC | PRN

## 2014-06-29 MED ORDER — ROPINIROLE HCL 0.5 MG PO TABS: 0.2500 mg | ORAL_TABLET | Freq: Every day | ORAL | Status: DC

## 2014-06-29 NOTE — Progress Notes (Signed)
Pre visit review using our clinic review tool, if applicable. No additional management support is needed unless otherwise documented below in the visit note.  B leg discomfort at night, can't sleep from sx from knees downward. Tramadol helps some but needing extra doses at night.  Legs are jumpy at night.  Compression stockings help with the swelling and some with the discomfort.    He was in [place] when his L hip started to hurt.  No injury.  No trauma.  He was seen by ortho since coming back.  L hip xray was done by ortho.  Injected by ortho yesterday.    His mother died a few months ago, I offered my condolences.    L elbow pain, at the medial epicondyle, pain with certain movements, no trauma.  No lateral pain.   PMH and SH reviewed  ROS: See HPI, otherwise noncontributory.  Meds, vitals, and allergies reviewed.   nad ncat rrr ctab abd soft L elbow with normal ROM, pain on pronation/supination but improved with compression of the proximal forearm Distally NV intact BLE edema controlled with compression stockings.  Normal DP pulses Calf/shin not ttp B

## 2014-06-29 NOTE — Patient Instructions (Signed)
Go to the lab on the way out.  We'll contact you with your lab report.  Try 1/2 to 1 tab of requip at night instead of the tramadol.  Get a tennis elbow strap for your elbow.   Take care.  Update me as needed.

## 2014-06-30 ENCOUNTER — Encounter: Payer: Self-pay | Admitting: Family Medicine

## 2014-06-30 DIAGNOSIS — M7700 Medial epicondylitis, unspecified elbow: Secondary | ICD-10-CM | POA: Insufficient documentation

## 2014-06-30 DIAGNOSIS — G2581 Restless legs syndrome: Secondary | ICD-10-CM | POA: Insufficient documentation

## 2014-06-30 LAB — CBC WITH DIFFERENTIAL/PLATELET
Basophils Absolute: 0 10*3/uL (ref 0.0–0.1)
Basophils Relative: 0.4 % (ref 0.0–3.0)
Eosinophils Absolute: 0.1 10*3/uL (ref 0.0–0.7)
Eosinophils Relative: 1.6 % (ref 0.0–5.0)
HCT: 39.6 % (ref 39.0–52.0)
Hemoglobin: 13.3 g/dL (ref 13.0–17.0)
LYMPHS PCT: 23.3 % (ref 12.0–46.0)
Lymphs Abs: 2 10*3/uL (ref 0.7–4.0)
MCHC: 33.5 g/dL (ref 30.0–36.0)
MCV: 86.8 fl (ref 78.0–100.0)
MONOS PCT: 6.1 % (ref 3.0–12.0)
Monocytes Absolute: 0.5 10*3/uL (ref 0.1–1.0)
NEUTROS ABS: 6 10*3/uL (ref 1.4–7.7)
Neutrophils Relative %: 68.6 % (ref 43.0–77.0)
Platelets: 325 10*3/uL (ref 150.0–400.0)
RBC: 4.57 Mil/uL (ref 4.22–5.81)
RDW: 13.6 % (ref 11.5–15.5)
WBC: 8.7 10*3/uL (ref 4.0–10.5)

## 2014-06-30 LAB — COMPREHENSIVE METABOLIC PANEL
ALK PHOS: 65 U/L (ref 39–117)
ALT: 11 U/L (ref 0–53)
AST: 17 U/L (ref 0–37)
Albumin: 3.9 g/dL (ref 3.5–5.2)
BILIRUBIN TOTAL: 0.4 mg/dL (ref 0.2–1.2)
BUN: 12 mg/dL (ref 6–23)
CHLORIDE: 101 meq/L (ref 96–112)
CO2: 29 mEq/L (ref 19–32)
Calcium: 9.5 mg/dL (ref 8.4–10.5)
Creatinine, Ser: 0.81 mg/dL (ref 0.40–1.50)
GFR: 100.41 mL/min (ref >60.00)
Glucose, Bld: 68 mg/dL — ABNORMAL LOW (ref 70–99)
Potassium: 4.1 mEq/L (ref 3.5–5.1)
SODIUM: 137 meq/L (ref 135–145)
Total Protein: 6.4 g/dL (ref 6.0–8.3)

## 2014-06-30 NOTE — Assessment & Plan Note (Signed)
Likely, check basic labs today, add on requip and update me as needed.  Start with 0.25mg  at night, can inc to 0.5mg  if needed.  He agrees.  He'll cut back on tramadol.

## 2014-06-30 NOTE — Assessment & Plan Note (Signed)
D/w pt.  Can use a strap and this should improve.  Fu prn.

## 2014-10-13 ENCOUNTER — Other Ambulatory Visit: Payer: Self-pay | Admitting: Family Medicine

## 2014-10-16 NOTE — Telephone Encounter (Signed)
Received refill request electronically Last refill 10/16/13 #10 Last office visit 06/29/14 Is it okay to refill?

## 2014-10-17 NOTE — Telephone Encounter (Signed)
Sent. Thanks.   

## 2014-11-01 ENCOUNTER — Other Ambulatory Visit: Payer: Self-pay | Admitting: Family Medicine

## 2014-11-01 DIAGNOSIS — Z125 Encounter for screening for malignant neoplasm of prostate: Secondary | ICD-10-CM

## 2014-11-01 DIAGNOSIS — E785 Hyperlipidemia, unspecified: Secondary | ICD-10-CM

## 2014-11-07 ENCOUNTER — Other Ambulatory Visit (INDEPENDENT_AMBULATORY_CARE_PROVIDER_SITE_OTHER): Payer: Medicare Other

## 2014-11-07 DIAGNOSIS — Z125 Encounter for screening for malignant neoplasm of prostate: Secondary | ICD-10-CM | POA: Diagnosis not present

## 2014-11-07 DIAGNOSIS — E785 Hyperlipidemia, unspecified: Secondary | ICD-10-CM | POA: Diagnosis not present

## 2014-11-07 LAB — COMPREHENSIVE METABOLIC PANEL
ALT: 13 U/L (ref 0–53)
AST: 18 U/L (ref 0–37)
Albumin: 4 g/dL (ref 3.5–5.2)
Alkaline Phosphatase: 72 U/L (ref 39–117)
BUN: 10 mg/dL (ref 6–23)
CALCIUM: 8.9 mg/dL (ref 8.4–10.5)
CHLORIDE: 102 meq/L (ref 96–112)
CO2: 30 meq/L (ref 19–32)
Creatinine, Ser: 0.76 mg/dL (ref 0.40–1.50)
GFR: 107.96 mL/min (ref >60.00)
Glucose, Bld: 89 mg/dL (ref 70–99)
Potassium: 4.2 mEq/L (ref 3.5–5.1)
Sodium: 139 mEq/L (ref 135–145)
Total Bilirubin: 0.6 mg/dL (ref 0.2–1.2)
Total Protein: 6.3 g/dL (ref 6.0–8.3)

## 2014-11-07 LAB — LIPID PANEL
CHOL/HDL RATIO: 4
Cholesterol: 169 mg/dL (ref 0–200)
HDL: 44.3 mg/dL (ref >39.00)
LDL CALC: 105 mg/dL — AB (ref 0–99)
NonHDL: 124.61
TRIGLYCERIDES: 99 mg/dL (ref 0.0–149.0)
VLDL: 19.8 mg/dL (ref 0.0–40.0)

## 2014-11-07 LAB — PSA, MEDICARE: PSA: 4.45 ng/ml — ABNORMAL HIGH (ref 0.10–4.00)

## 2014-11-09 ENCOUNTER — Other Ambulatory Visit: Payer: Self-pay

## 2014-11-16 ENCOUNTER — Ambulatory Visit (INDEPENDENT_AMBULATORY_CARE_PROVIDER_SITE_OTHER): Payer: Medicare Other | Admitting: Family Medicine

## 2014-11-16 ENCOUNTER — Encounter: Payer: Self-pay | Admitting: Family Medicine

## 2014-11-16 VITALS — BP 118/70 | HR 72 | Temp 98.1°F | Ht 67.0 in | Wt 166.0 lb

## 2014-11-16 DIAGNOSIS — I10 Essential (primary) hypertension: Secondary | ICD-10-CM

## 2014-11-16 DIAGNOSIS — G2581 Restless legs syndrome: Secondary | ICD-10-CM | POA: Diagnosis not present

## 2014-11-16 DIAGNOSIS — Z119 Encounter for screening for infectious and parasitic diseases, unspecified: Secondary | ICD-10-CM

## 2014-11-16 DIAGNOSIS — Z23 Encounter for immunization: Secondary | ICD-10-CM | POA: Diagnosis not present

## 2014-11-16 DIAGNOSIS — Z0000 Encounter for general adult medical examination without abnormal findings: Secondary | ICD-10-CM | POA: Diagnosis not present

## 2014-11-16 DIAGNOSIS — N400 Enlarged prostate without lower urinary tract symptoms: Secondary | ICD-10-CM

## 2014-11-16 DIAGNOSIS — R252 Cramp and spasm: Secondary | ICD-10-CM

## 2014-11-16 MED ORDER — METHOCARBAMOL 500 MG PO TABS: 500.0000 mg | ORAL_TABLET | Freq: Four times a day (QID) | ORAL | Status: DC | PRN

## 2014-11-16 MED ORDER — ROPINIROLE HCL 0.5 MG PO TABS: 0.5000 mg | ORAL_TABLET | Freq: Every day | ORAL | Status: DC

## 2014-11-16 MED ORDER — AMLODIPINE BESYLATE 10 MG PO TABS: 10.0000 mg | ORAL_TABLET | Freq: Every day | ORAL | Status: DC

## 2014-11-16 MED ORDER — CELECOXIB 200 MG PO CAPS: 200.0000 mg | ORAL_CAPSULE | Freq: Every day | ORAL | Status: DC

## 2014-11-16 MED ORDER — DIAZEPAM 2 MG PO TABS
4.0000 mg | ORAL_TABLET | Freq: Every evening | ORAL | Status: DC | PRN
Start: 2014-11-16 — End: 2015-01-17

## 2014-11-16 NOTE — Progress Notes (Signed)
Pre visit review using our clinic review tool, if applicable. No additional management support is needed unless otherwise documented below in the visit note.  I have personally reviewed the Medicare Annual Wellness questionnaire and have noted 1. The patient's medical and social history 2. Their use of alcohol, tobacco or illicit drugs 3. Their current medications and supplements 4. The patient's functional ability including ADL's, fall risks, home safety risks and hearing or visual             impairment. 5. Diet and physical activities 6. Evidence for depression or mood disorders  The patients weight, height, BMI have been recorded in the chart and visual acuity is per eye clinic.  I have made referrals, counseling and provided education to the patient based review of the above and I have provided the pt with a written personalized care plan for preventive services.  Provider list updated- see scanned forms.  Routine anticipatory guidance given to patient.  See health maintenance.  Flu to be done at his church later this fall, d/w pt.  Shingles 2009 PNA updated 2016 Tetanus 2008 Colonoscopy 2008 Prostate cancer screening per urology.  Recently checked at [facility] and stable.  He'll f/u with uro.   Advance directive- wife designated if patient were incapacitated.   Cognitive function addressed- see scanned forms- and if abnormal then additional documentation follows.  Pt opts in for HCV screening with next set of labs.  D/w pt re: routine screening.   Hypertension:    Using medication without problems or lightheadedness: yes Chest pain with exertion:no Edema:no Short of breath:no  He has f/u with Dr. Synthia Innocent re: hip injection.  He may end up needing hip replacement at some point.   Mustard helps his cramps but it upsets his stomach.  He was asking about getting some methocarbamol for prn use.  He had used prev w/o ADE and with some good effect.    RLS sx resolved without ADE on requip.   Sleeping well.    PMH and SH reviewed  Meds, vitals, and allergies reviewed.   ROS: See HPI.  Otherwise negative.    GEN: nad, alert and oriented HEENT: mucous membranes moist NECK: supple w/o LA CV: rrr. PULM: ctab, no inc wob ABD: soft, +bs EXT: no edema SKIN: no acute rash

## 2014-11-16 NOTE — Patient Instructions (Addendum)
You can take tramadol with the tylenol.   Let me know before you go on your mission trip.   Take care.  Glad to see you.

## 2014-11-20 ENCOUNTER — Telehealth: Payer: Self-pay

## 2014-11-20 DIAGNOSIS — R252 Cramp and spasm: Secondary | ICD-10-CM | POA: Insufficient documentation

## 2014-11-20 NOTE — Telephone Encounter (Signed)
Form done, scan and send.  Thanks.

## 2014-11-20 NOTE — Assessment & Plan Note (Signed)
Controlled with requip, continue as is.  No ADE on med.

## 2014-11-20 NOTE — Assessment & Plan Note (Signed)
Controlled, continue as is.  D/w pt.  He agrees.  Continue diet and exercise, no change in meds.

## 2014-11-20 NOTE — Telephone Encounter (Signed)
Pt left v/m; [facility] notified pt robaxin/methocarbamol is not covered by pts ins and pt request call to [phone] for prior auth to be done.

## 2014-11-20 NOTE — Assessment & Plan Note (Signed)
With PSA stable, d/w pt.  Has f/u with uro pending.

## 2014-11-20 NOTE — Telephone Encounter (Signed)
Spoke to [facility] at [employer] and was advised that she will fax the PA paperwork over for completion.  Paperwork received and placed on your desk.

## 2014-11-20 NOTE — Assessment & Plan Note (Signed)
[name] helps his cramps but it upsets his stomach. He was asking about getting some methocarbamol for prn use. He had used prev w/o ADE and with some good effect.  Okay to continue prn use of methocarbamol.  D/w pt.

## 2014-11-20 NOTE — Assessment & Plan Note (Signed)
Flu to be done at his church later this fall, d/w pt.  Shingles 2009 PNA updated 2016 Tetanus 2008 Colonoscopy 2008 Prostate cancer screening per urology.  Recently checked at [facility] and stable.  He'll f/u with uro.   Advance directive- wife designated if patient were incapacitated.   Cognitive function addressed- see scanned forms- and if abnormal then additional documentation follows.  Pt opts in for HCV screening with next set of labs.  D/w pt re: routine screening.

## 2014-11-22 NOTE — Telephone Encounter (Signed)
Paperwork on your desk from [place] regarding PA.

## 2014-11-23 NOTE — Telephone Encounter (Signed)
Ebony Hail with [facility] -  prior auth for methocarbamol wanted to know the dx for taking the med; is muscle cramping acute and when was rx prescribed; advised Dx R25.2 for muscle cramps; yes is acute and rx prescribed on 11/16/14. Ebony Hail will relay info to pharmacist and will let know PA outcome.

## 2014-11-23 NOTE — Telephone Encounter (Signed)
I'll work on the hard copy.  Thanks.  

## 2014-12-01 NOTE — Telephone Encounter (Signed)
Pt left v/m request cb about methocarbamol PA.

## 2014-12-01 NOTE — Telephone Encounter (Signed)
I called him. Was approved.  He knew about it before I called.  He thanked [place].

## 2014-12-24 ENCOUNTER — Other Ambulatory Visit: Payer: Self-pay | Admitting: Family Medicine

## 2014-12-27 ENCOUNTER — Other Ambulatory Visit: Payer: Self-pay

## 2014-12-27 MED ORDER — TRAMADOL HCL 50 MG PO TABS: 100.0000 mg | ORAL_TABLET | Freq: Three times a day (TID) | ORAL | Status: DC | PRN

## 2014-12-27 NOTE — Telephone Encounter (Signed)
Per Dr. [name] script was given to [facility].

## 2014-12-27 NOTE — Telephone Encounter (Signed)
Printed.  Thanks.  Please send to mail order.

## 2014-12-27 NOTE — Telephone Encounter (Signed)
Incoming fax from [facility]---Rx last filled 06/27/14 #540 with 1 refill---if you are okay with mail order, I will place form in your inbox---please return to me

## 2014-12-27 NOTE — Telephone Encounter (Signed)
Rx has been faxed to [facility]

## 2014-12-29 ENCOUNTER — Telehealth: Payer: Self-pay

## 2014-12-29 NOTE — Telephone Encounter (Signed)
Pt called and he stated he just wanted to let you know that his hip replacement surgery has been scheduled. [place] [facility] will be sending you a letter that will give the OK for him to proceed with the surgery and he wanted to just let you know so you can keep a look out. I have not seen form as of yet

## 2014-12-31 NOTE — Telephone Encounter (Signed)
I have the note from ortho. It would be worth doing a preop visit with an EKG.  I'll hold the note in the meantime.  Thanks.

## 2015-01-01 NOTE — Telephone Encounter (Signed)
Left detailed message on voicemail to schedule 30 min preop OV with receptionist answering the phone.

## 2015-01-01 NOTE — Telephone Encounter (Signed)
Pt left v/m returning call and request cb. 

## 2015-01-01 NOTE — Telephone Encounter (Signed)
Left detailed message on patient's voicemail (DPR) advising patient to call the office to schedule a 30 minute preop visit as instructed.

## 2015-01-02 NOTE — Telephone Encounter (Signed)
Appointment scheduled.

## 2015-01-03 ENCOUNTER — Encounter: Payer: Self-pay | Admitting: Family Medicine

## 2015-01-03 ENCOUNTER — Ambulatory Visit (INDEPENDENT_AMBULATORY_CARE_PROVIDER_SITE_OTHER): Payer: Medicare Other | Admitting: Family Medicine

## 2015-01-03 VITALS — BP 100/60 | HR 74 | Temp 98.8°F | Wt 162.8 lb

## 2015-01-03 DIAGNOSIS — Z01810 Encounter for preprocedural cardiovascular examination: Secondary | ICD-10-CM | POA: Diagnosis not present

## 2015-01-03 DIAGNOSIS — M158 Other polyosteoarthritis: Secondary | ICD-10-CM | POA: Diagnosis not present

## 2015-01-03 MED ORDER — CIPROFLOXACIN HCL 500 MG PO TABS: 500.0000 mg | ORAL_TABLET | Freq: Two times a day (BID) | ORAL | Status: AC

## 2015-01-03 NOTE — Progress Notes (Signed)
Pre visit review using our clinic review tool, if applicable. No additional management support is needed unless otherwise documented below in the visit note.  L hip replacement scheduled for 05/2015.   Has seen ENT about this throat lesion prev, noncancerous and no f/u needed.  No h/o difficult intubation.  Has tolerated anesthesia prev.   No h/o MI, CVA, CHF, TIA.  No h/o PE/DVT.  No h/o bleeding disorders.   No CP SOB or BLE edema.   His trouble getting around is due to his hip pain, not a cardiopulmonary source.   H/o treated HTN.   No h/o DM2.   Flu shot to be done next week at church.    Will travel to [place] in 2017.  Needed cipro rx done for potential traveller's diarrhea.   Meds, vitals, and allergies reviewed.   ROS: See HPI.  Otherwise, noncontributory.  GEN: nad, alert and oriented HEENT: mucous membranes moist NECK: supple w/o LA CV: rrr. PULM: ctab, no inc wob ABD: soft, +bs EXT: no edema

## 2015-01-03 NOTE — Patient Instructions (Signed)
Take care.  Glad to see you.  Update me as needed.  We'll notify ortho.

## 2015-01-04 NOTE — Assessment & Plan Note (Addendum)
His trouble getting around is due to his hip pain/OA, not a cardiopulmonary source.   Not getting relief from injections.  He is appropriately low risk for surgery, d/w pt.  App ortho help.   Update me as needed.   Will defer preop labs to ortho/anesthesia.  >25 minutes spent in face to face time with patient, >50% spent in counselling or coordination of care.

## 2015-01-11 ENCOUNTER — Other Ambulatory Visit: Payer: Self-pay | Admitting: *Deleted

## 2015-01-17 ENCOUNTER — Other Ambulatory Visit: Payer: Self-pay

## 2015-01-17 NOTE — Telephone Encounter (Signed)
Pt left v/m requesting refill diazepam to [facility]. Pt request cb when refilled. Pt is almost out of med; pt requested refill from [facility] 01/08/15. On med list diazepam listed # 30 x 5 refill but no print. Pt last seen 11/16/14.

## 2015-01-18 MED ORDER — DIAZEPAM 2 MG PO TABS: 4.0000 mg | ORAL_TABLET | Freq: Every evening | ORAL | Status: DC | PRN

## 2015-01-18 NOTE — Telephone Encounter (Signed)
Faxed.  [phone]

## 2015-01-18 NOTE — Telephone Encounter (Signed)
Printed.  Please fax in. Thanks.  

## 2015-04-03 ENCOUNTER — Ambulatory Visit (INDEPENDENT_AMBULATORY_CARE_PROVIDER_SITE_OTHER): Payer: Medicare Other | Admitting: Family Medicine

## 2015-04-03 ENCOUNTER — Encounter: Payer: Self-pay | Admitting: Family Medicine

## 2015-04-03 VITALS — BP 112/70 | HR 85 | Temp 98.4°F | Wt 163.2 lb

## 2015-04-03 DIAGNOSIS — J069 Acute upper respiratory infection, unspecified: Secondary | ICD-10-CM

## 2015-04-03 MED ORDER — AMOXICILLIN-POT CLAVULANATE 875-125 MG PO TABS: 1.0000 | ORAL_TABLET | Freq: Two times a day (BID) | ORAL | Status: DC

## 2015-04-03 NOTE — Progress Notes (Signed)
Pre visit review using our clinic review tool, if applicable. No additional management support is needed unless otherwise documented below in the visit note.  Sx started about 1 week ago.  Had R, then L, ear pain.  Bloody/discolored rhinorrhea.  No fevers but had some chills.  He feels the worst in the AM. No vomiting. Some facial pain with some upper tooth pain.  Some frontal pain.  No cough.   He has a mission trip to [place] in about 10 days.  He'll have his hip surgery after coming back from [place].   Meds, vitals, and allergies reviewed.   ROS: See HPI.  Otherwise, noncontributory.  GEN: nad, alert and oriented HEENT: mucous membranes moist, tm w/o erythema, nasal exam w/o erythema, clear discharge noted,  OP with cobblestoning NECK: supple w/o LA CV: rrr.   PULM: ctab, no inc wob EXT: no edema SKIN: no acute rash

## 2015-04-03 NOTE — Patient Instructions (Signed)
Use nasal saline and start augmentin.  Try to get as much rest as possible.  Drink plenty of fluids.  Update me as needed.  Take care.  Have a good trip.

## 2015-04-04 DIAGNOSIS — J069 Acute upper respiratory infection, unspecified: Secondary | ICD-10-CM | POA: Insufficient documentation

## 2015-04-04 NOTE — Assessment & Plan Note (Signed)
Nontoxic, presumed sinusitis, start augmentin and f/u prn.  He agrees.  He'll update me as needed.

## 2015-04-09 ENCOUNTER — Ambulatory Visit: Payer: Self-pay | Admitting: Orthopedic Surgery

## 2015-04-09 NOTE — Progress Notes (Signed)
Preoperative surgical orders have been place into the Epic hospital system for Woodfin Ganja on 04/09/2015, 10:09 AM  by Mickel Crow for surgery on 05-02-15.  Preop Total Hip - Anterior Approach orders including IV Tylenol, and IV Decadron as long as there are no contraindications to the above medications. Arlee Muslim, PA-C

## 2015-04-24 ENCOUNTER — Other Ambulatory Visit: Payer: Self-pay | Admitting: Family Medicine

## 2015-04-24 ENCOUNTER — Other Ambulatory Visit: Payer: Self-pay | Admitting: *Deleted

## 2015-04-24 NOTE — Patient Instructions (Addendum)
YOUR PROCEDURE IS SCHEDULED ON :05/02/15  REPORT TO [facility] MAIN ENTRANCE FOLLOW SIGNS TO EAST ELEVATOR - GO TO 3rd FLOOR CHECK IN AT 3 EAST NURSES STATION (SHORT STAY) AT: 6:30 AM  CALL THIS NUMBER IF YOU HAVE PROBLEMS THE MORNING OF SURGERY [phone]  REMEMBER:ONLY 1 PER PERSON MAY GO TO SHORT STAY WITH YOU TO GET READY THE MORNING OF YOUR SURGERY  DO NOT EAT FOOD OR DRINK LIQUIDS AFTER MIDNIGHT  TAKE THESE MEDICINES THE MORNING OF SURGERY:  none  YOU MAY NOT HAVE ANY METAL ON YOUR BODY INCLUDING HAIR PINS AND PIERCING'S. DO NOT WEAR JEWELRY, MAKEUP, LOTIONS, POWDERS OR PERFUMES. DO NOT WEAR NAIL POLISH. DO NOT SHAVE 48 HRS PRIOR TO SURGERY. MEN MAY SHAVE FACE AND NECK.  DO NOT [place]. [facility] IS NOT RESPONSIBLE FOR VALUABLES.  CONTACTS, DENTURES OR PARTIALS MAY NOT BE WORN TO SURGERY. LEAVE SUITCASE IN CAR. CAN BE BROUGHT TO ROOM AFTER SURGERY.  PATIENTS DISCHARGED THE DAY OF SURGERY WILL NOT BE ALLOWED TO DRIVE HOME.  PLEASE READ OVER THE FOLLOWING INSTRUCTION SHEETS _________________________________________________________________________________                                          [place] - PREPARING FOR SURGERY  Before surgery, you can play an important role.  Because skin is not sterile, your skin needs to be as free of germs as possible.  You can reduce the number of germs on your skin by washing with CHG (chlorahexidine gluconate) soap before surgery.  CHG is an antiseptic cleaner which kills germs and bonds with the skin to continue killing germs even after washing. Please DO NOT use if you have an allergy to CHG or antibacterial soaps.  If your skin becomes reddened/irritated stop using the CHG and inform your nurse when you arrive at Short Stay. Do not shave (including legs and underarms) for at least 48 hours prior to the first CHG shower.  You may shave your face. Please follow these instructions  carefully:   1.  Shower with CHG Soap the night before surgery and the  morning of Surgery.   2.  If you choose to wash your hair, wash your hair first as usual with your  normal  Shampoo.   3.  After you shampoo, rinse your hair and body thoroughly to remove the  shampoo.                                         4.  Use CHG as you would any other liquid soap.  You can apply chg directly  to the skin and wash . Gently wash with scrungie or clean wascloth    5.  Apply the CHG Soap to your body ONLY FROM THE NECK DOWN.   Do not use on open                           Wound or open sores. Avoid contact with eyes, ears mouth and genitals (private parts).                        Genitals (private parts) with your normal soap.  6.  Wash thoroughly, paying special attention to the area where your surgery  will be performed.   7.  Thoroughly rinse your body with warm water from the neck down.   8.  DO NOT shower/wash with your normal soap after using and rinsing off  the CHG Soap .                9.  Pat yourself dry with a clean towel.             10.  Wear clean night clothes to bed after shower             11.  Place clean sheets on your bed the night of your first shower and do not  sleep with pets.  Day of Surgery : Do not apply any lotions/deodorants the morning of surgery.  Please wear clean clothes to the hospital/surgery center.  FAILURE TO FOLLOW THESE INSTRUCTIONS MAY RESULT IN THE CANCELLATION OF YOUR SURGERY    PATIENT SIGNATURE_________________________________  ______________________________________________________________________     [name]  An incentive spirometer is a tool that can help keep your lungs clear and active. This tool measures how well you are filling your lungs with each breath. Taking long deep breaths may help reverse or decrease the chance of developing breathing (pulmonary) problems (especially infection) following:  A long  period of time when you are unable to move or be active. BEFORE THE PROCEDURE   If the spirometer includes an indicator to show your best effort, your nurse or respiratory therapist will set it to a desired goal.  If possible, sit up straight or lean slightly forward. Try not to slouch.  Hold the incentive spirometer in an upright position. INSTRUCTIONS FOR USE   Sit on the edge of your bed if possible, or sit up as far as you can in bed or on a chair.  Hold the incentive spirometer in an upright position.  Breathe out normally.  Place the mouthpiece in your mouth and seal your lips tightly around it.  Breathe in slowly and as deeply as possible, raising the piston or the ball toward the top of the column.  Hold your breath for 3-5 seconds or for as long as possible. Allow the piston or ball to fall to the bottom of the column.  Remove the mouthpiece from your mouth and breathe out normally.  Rest for a few seconds and repeat Steps 1 through 7 at least 10 times every 1-2 hours when you are awake. Take your time and take a few normal breaths between deep breaths.  The spirometer may include an indicator to show your best effort. Use the indicator as a goal to work toward during each repetition.  After each set of 10 deep breaths, practice coughing to be sure your lungs are clear. If you have an incision (the cut made at the time of surgery), support your incision when coughing by placing a pillow or rolled up towels firmly against it. Once you are able to get out of bed, walk around indoors and cough well. You may stop using the incentive spirometer when instructed by your caregiver.  RISKS AND COMPLICATIONS  Take your time so you do not get dizzy or light-headed.  If you are in pain, you may need to take or ask for pain medication before doing incentive spirometry. It is harder to take a deep breath if you are having pain. AFTER USE  Rest and breathe slowly and easily.  It can  be helpful to keep track of a log of your progress. Your caregiver can provide you with a simple table to help with this. If you are using the spirometer at home, follow these instructions: [name] IF:   You are having difficultly using the spirometer.  You have trouble using the spirometer as often as instructed.  Your pain medication is not giving enough relief while using the spirometer.  You develop fever of 100.5 F (38.1 C) or higher. SEEK IMMEDIATE MEDICAL CARE IF:   You cough up bloody sputum that had not been present before.  You develop fever of 102 F (38.9 C) or greater.  You develop worsening pain at or near the incision site. MAKE SURE YOU:   Understand these instructions.  Will watch your condition.  Will get help right away if you are not doing well or get worse. Document Released: 06/30/2006 Document Revised: 05/12/2011 Document Reviewed: 08/31/2006 ExitCare Patient Information 2014 Exit[employer], [place].   ________________________________________________________________________  WHAT IS A BLOOD TRANSFUSION? Blood Transfusion Information  A transfusion is the replacement of blood or some of its parts. Blood is made up of multiple cells which provide different functions.  Red blood cells carry oxygen and are used for blood loss replacement.  White blood cells fight against infection.  Platelets control bleeding.  Plasma helps clot blood.  Other blood products are available for specialized needs, such as hemophilia or other clotting disorders. BEFORE THE TRANSFUSION  Who gives blood for transfusions?   Healthy volunteers who are fully evaluated to make sure their blood is safe. This is blood bank blood. Transfusion therapy is the safest it has ever been in the practice of medicine. Before blood is taken from a donor, a complete history is taken to make sure that person has no history of diseases nor engages in risky social behavior (examples are  intravenous drug use or sexual activity with multiple partners). The donor's travel history is screened to minimize risk of transmitting infections, such as malaria. The donated blood is tested for signs of infectious diseases, such as HIV and hepatitis. The blood is then tested to be sure it is compatible with you in order to minimize the chance of a transfusion reaction. If you or a relative donates blood, this is often done in anticipation of surgery and is not appropriate for emergency situations. It takes many days to process the donated blood. RISKS AND COMPLICATIONS Although transfusion therapy is very safe and saves many lives, the main dangers of transfusion include:   Getting an infectious disease.  Developing a transfusion reaction. This is an allergic reaction to something in the blood you were given. Every precaution is taken to prevent this. The decision to have a blood transfusion has been considered carefully by your caregiver before blood is given. Blood is not given unless the benefits outweigh the risks. AFTER THE TRANSFUSION  Right after receiving a blood transfusion, you will usually feel much better and more energetic. This is especially true if your red blood cells have gotten low (anemic). The transfusion raises the level of the red blood cells which carry oxygen, and this usually causes an energy increase.  The nurse administering the transfusion will monitor you carefully for complications. HOME CARE INSTRUCTIONS  No special instructions are needed after a transfusion. You may find your energy is better. Speak with your caregiver about any limitations on activity for underlying diseases you may have. SEEK MEDICAL CARE IF:   Your  condition is not improving after your transfusion.  You develop redness or irritation at the intravenous (IV) site. SEEK IMMEDIATE MEDICAL CARE IF:  Any of the following symptoms occur over the next 12 hours:  Shaking chills.  You have a  temperature by mouth above 102 F (38.9 C), not controlled by medicine.  Chest, back, or muscle pain.  People around you feel you are not acting correctly or are confused.  Shortness of breath or difficulty breathing.  Dizziness and fainting.  You get a rash or develop hives.  You have a decrease in urine output.  Your urine turns a dark color or changes to pink, red, or brown. Any of the following symptoms occur over the next 10 days:  You have a temperature by mouth above 102 F (38.9 C), not controlled by medicine.  Shortness of breath.  Weakness after normal activity.  The white part of the eye turns yellow (jaundice).  You have a decrease in the amount of urine or are urinating less often.  Your urine turns a dark color or changes to pink, red, or brown. Document Released: 02/15/2000 Document Revised: 05/12/2011 Document Reviewed: 10/04/2007 [facility] Patient Information 2014 [place], [place].  _______________________________________________________________________

## 2015-04-24 NOTE — Telephone Encounter (Signed)
Faxed refill request. Last Filled:    540 tablet 1 12/27/2014  Last office visit:   04/03/15   Last CPE 2015.  Please advise.

## 2015-04-25 ENCOUNTER — Encounter (HOSPITAL_COMMUNITY)
Admission: RE | Admit: 2015-04-25 | Discharge: 2015-04-25 | Disposition: A | Discharge status: Home / Self Care | Payer: Medicare Other | Source: Ambulatory Visit | Attending: Orthopedic Surgery | Admitting: Orthopedic Surgery

## 2015-04-25 ENCOUNTER — Encounter (HOSPITAL_COMMUNITY): Payer: Self-pay

## 2015-04-25 DIAGNOSIS — Z01812 Encounter for preprocedural laboratory examination: Secondary | ICD-10-CM | POA: Diagnosis present

## 2015-04-25 DIAGNOSIS — M1612 Unilateral primary osteoarthritis, left hip: Secondary | ICD-10-CM | POA: Diagnosis not present

## 2015-04-25 DIAGNOSIS — Z0183 Encounter for blood typing: Secondary | ICD-10-CM | POA: Insufficient documentation

## 2015-04-25 LAB — URINALYSIS, ROUTINE W REFLEX MICROSCOPIC
BILIRUBIN URINE: NEGATIVE
GLUCOSE, UA: NEGATIVE mg/dL
HGB URINE DIPSTICK: NEGATIVE
Ketones, ur: NEGATIVE mg/dL
Leukocytes, UA: NEGATIVE
Nitrite: NEGATIVE
PROTEIN: NEGATIVE mg/dL
Specific Gravity, Urine: 1.007 (ref 1.005–1.030)
pH: 7 (ref 5.0–8.0)

## 2015-04-25 LAB — CBC
HCT: 40.3 % (ref 39.0–52.0)
HEMOGLOBIN: 13.1 g/dL (ref 13.0–17.0)
MCH: 28.3 pg (ref 26.0–34.0)
MCHC: 32.5 g/dL (ref 30.0–36.0)
MCV: 87 fL (ref 78.0–100.0)
PLATELETS: 357 10*3/uL (ref 150–400)
RBC: 4.63 MIL/uL (ref 4.22–5.81)
RDW: 13.5 % (ref 11.5–15.5)
WBC: 5.7 10*3/uL (ref 4.0–10.5)

## 2015-04-25 LAB — PROTIME-INR
INR: 1.14 (ref 0.00–1.49)
Prothrombin Time: 14.4 seconds (ref 11.6–15.2)

## 2015-04-25 LAB — COMPREHENSIVE METABOLIC PANEL
ALBUMIN: 4.1 g/dL (ref 3.5–5.0)
ALK PHOS: 72 U/L (ref 38–126)
ALT: 14 U/L — ABNORMAL LOW (ref 17–63)
ANION GAP: 9 (ref 5–15)
AST: 22 U/L (ref 15–41)
BUN: 13 mg/dL (ref 6–20)
CHLORIDE: 102 mmol/L (ref 101–111)
CO2: 25 mmol/L (ref 22–32)
Calcium: 9 mg/dL (ref 8.9–10.3)
Creatinine, Ser: 0.67 mg/dL (ref 0.61–1.24)
GFR calc non Af Amer: >60 mL/min (ref >60)
GLUCOSE: 81 mg/dL (ref 65–99)
POTASSIUM: 3.7 mmol/L (ref 3.5–5.1)
SODIUM: 136 mmol/L (ref 135–145)
Total Bilirubin: 0.6 mg/dL (ref 0.3–1.2)
Total Protein: 7 g/dL (ref 6.5–8.1)

## 2015-04-25 LAB — APTT: APTT: 29 s (ref 24–37)

## 2015-04-25 LAB — SURGICAL PCR SCREEN
MRSA, PCR: NEGATIVE
STAPHYLOCOCCUS AUREUS: POSITIVE — AB

## 2015-04-25 MED ORDER — TRAMADOL HCL 50 MG PO TABS: 100.0000 mg | ORAL_TABLET | Freq: Three times a day (TID) | ORAL | Status: DC

## 2015-04-25 NOTE — Telephone Encounter (Signed)
Printed.  Please fax in. Thanks.  

## 2015-04-25 NOTE — Progress Notes (Signed)
01/03/2015-noted EKG in EPIC 12/29/2014-Pre-operative clearance from Dr. [name] on chart

## 2015-04-25 NOTE — Telephone Encounter (Signed)
Rx faxed to pharmacy as instructed. 

## 2015-05-01 ENCOUNTER — Ambulatory Visit: Payer: Self-pay | Admitting: Orthopedic Surgery

## 2015-05-01 NOTE — H&P (Signed)
[name] DOB: 1945-12-01 Married / Language: English / Race: White Male Date of Admission:  05/02/2015 CC:  Left Hip Pain History of Present Illness The patient is a 70 year old male who comes in for a preoperative History and Physical. The patient is scheduled for a left total hip arthroplasty (anterior) to be performed by Dr. Dione Plover. Aluisio, MD at [facility] on 05-02-2015. The patient is a 70 year old male who presented for follow up of their hip. The patient is being followed for their left hip pain and osteoarthritis. They are month(s) out from last intra-articular injection. Symptoms reported include: pain and aching. The patient feels that they are doing poorly and report their pain level to be moderate. The following medication has been used for pain control: Hydrocodone. The patient has not gotten any relief of their symptoms with Cortisone injections (the last injection really only helped for a few days). The patient indicates that they are ready to get the hip fixed. He states the hip has gotten progressively worse over the past year. He is eagerly anticipating getting his hip replaced. He has had injections into the hip but have only provided temporary relief. They have been treated conservatively in the past for the above stated problem and despite conservative measures, they continue to have progressive pain and severe functional limitations and dysfunction. They have failed non-operative management including home exercise, medications, and injections. It is felt that they would benefit from undergoing total joint replacement. Risks and benefits of the procedure have been discussed with the patient and they elect to proceed with surgery. There are no active contraindications to surgery such as ongoing infection or rapidly progressive neurological disease.  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)  Primary osteoarthritis of left hip (M16.12)  Pain, Lumbar (LBP) (724.2)  Skin Cancer   High blood pressure  Gastroesophageal Reflux Disease  Osteoarthritis  Kidney Stone  Tinnitus  Measles  Childhood  Allergies Ambien *HYPNOTICS*  horrible dreams Hydrocodone-Acetaminophen *ANALGESICS - OPIOID*  "feel thick" in the head Sulfanilamide *CHEMICALS*  Sulfa drugs - "feels thick" in the head  Family History  Cerebrovascular Accident  father Hypertension  mother and father Osteoarthritis  sister and brother Other medical problems  TWIN SISTERS AND BROTHER WITH LEG PERTHES, MOTHER WITH IRREGULAR HEART BEAT Rheumatoid Arthritis  mother Severe allergy  mother  Social History  Number of flights of stairs before winded  2-3 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 Marital status  married Pain Contract  no Tobacco / smoke exposure  no Previously in rehab  no Current work status  working full time Children  3 Alcohol use  never consumed alcohol Drug/Alcohol Rehab (Currently)  no Living situation  live with spouse Illicit drug use  no Exercise  Exercises daily; does running / walking Tobacco use  never smoker Psychologist, prison and probation services Will, Healthcare POA [place]  Medication History Aspirin EC (81MG  Tablet DR, Oral) Active. Omeprazole (Oral) Specific strength unknown - Active. Norvasc (Oral) Specific strength unknown - Active. CeleBREX (200MG  Capsule, Oral) Active. TraMADol HCl (50MG  Tablet, Oral) Active. Requip (0.5MG  Tablet, Oral) Active. DiazePAM (2MG  Tablet, Oral) Active. Multivital (Oral) Active.  Past Surgical History  Total Hip Replacement  Date: 2010. right Other Surgery  Date: 41. TOTAL LEFT  ACHILLES TENDON REPAIR Right Rotator Cuff  Repair  Date: 2012. Dr. Onnie Graham   Review of Systems General Not Present- Chills, Fatigue, Fever, Memory Loss, Night Sweats, Weight Gain and Weight Loss. Skin Not Present- Eczema, Hives, Itching, Lesions and Rash. HEENT Present- Tinnitus (Left Ear). Not Present- Dentures, Double Vision, Headache, Hearing Loss and Visual Loss. Respiratory Not Present- Allergies, Chronic Cough, Coughing up blood, Shortness of breath at rest and Shortness of breath with exertion. Cardiovascular Not Present- Chest Pain, Difficulty Breathing Lying Down, Murmur, Palpitations, Racing/skipping heartbeats and Swelling. Gastrointestinal Not Present- Abdominal Pain, Bloody Stool, Constipation, Diarrhea, Difficulty Swallowing, Heartburn, Jaundice, Loss of appetitie, Nausea and Vomiting. Male Genitourinary Present- Urinating at Night. Not Present- Blood in Urine, Discharge, Flank Pain, Incontinence, Painful Urination, Urgency, Urinary frequency, Urinary Retention and Weak urinary stream. Musculoskeletal Present- Back Pain and Joint Pain. Not Present- Joint Swelling, Morning Stiffness, Muscle Pain, Muscle Weakness and Spasms. Neurological Not Present- Blackout spells, Difficulty with balance, Dizziness, Paralysis, Tremor and Weakness. Psychiatric Not Present- Insomnia.  Vitals Weight: 165 lb Height: 67in Weight was reported by patient. Height was reported by patient. Body Surface Area: 1.86 m Body Mass Index: 25.84 kg/m  Pulse: 60 (Regular)  BP: 110/62 (Sitting, Right Arm, Standard)   Physical Exam General Mental Status -Alert, cooperative and good historian. General Appearance-pleasant, Not in acute distress. Orientation-Oriented X3. Build & Nutrition-Well nourished and Well developed.  Head and Neck Head-normocephalic, atraumatic . Neck Global Assessment - supple, no bruit auscultated on the right, no bruit auscultated on the left.  Eye Vision-Wears corrective lenses. Pupil -  Bilateral-Regular and Round. Motion - Bilateral-EOMI.  Chest and Lung Exam Auscultation Breath sounds - clear at anterior chest wall and clear at posterior chest wall. Adventitious sounds - No Adventitious sounds.  Cardiovascular Auscultation Rhythm - Regular rate and rhythm. Heart Sounds - S1 WNL and S2 WNL. Murmurs & Other Heart Sounds - Auscultation of the heart reveals - No Murmurs.  Abdomen Palpation/Percussion Tenderness - Abdomen is non-tender to palpation. Rigidity (guarding) - Abdomen is soft. Auscultation Auscultation of the abdomen reveals - Bowel sounds normal.  Male Genitourinary Note: Not done, not pertinent to present illness   Musculoskeletal Note: He is alert and oriented, in no apparent distress. His left hip can be flexed to about 110, minimal internal rotation, about 20 external rotation, 20 abduction.  IMAGING We reviewed his radiographs of last month. He is now completely bone on bone in that left hip with subchondral cystic formation.   Assessment & Plan Primary osteoarthritis of left hip (M16.12) Status post right hip replacement [id number]  Note:Surgical Plans: Left Total Hip Replacement - Anterior Approach  Disposition: Home  PCP: Dr. [name] - Patient has been seen preoperatively and felt to be stable for surgery.  IV TXA  Anesthesia Issues: None  Signed electronically by Ok Edwards, III PA-C

## 2015-05-02 ENCOUNTER — Encounter (HOSPITAL_COMMUNITY)
Admission: RE | Disposition: A | Discharge status: Home Health | Payer: Self-pay | Source: Ambulatory Visit | Attending: Orthopedic Surgery

## 2015-05-02 ENCOUNTER — Encounter (HOSPITAL_COMMUNITY): Payer: Self-pay | Admitting: Cardiology

## 2015-05-02 ENCOUNTER — Inpatient Hospital Stay (HOSPITAL_COMMUNITY): Payer: Medicare Other | Admitting: Registered Nurse

## 2015-05-02 ENCOUNTER — Inpatient Hospital Stay (HOSPITAL_COMMUNITY): Payer: Medicare Other

## 2015-05-02 ENCOUNTER — Inpatient Hospital Stay (HOSPITAL_COMMUNITY)
Admission: RE | Admit: 2015-05-02 | Discharge: 2015-05-03 | DRG: 470 | Disposition: A | Discharge status: Home Health | Payer: Medicare Other | Source: Ambulatory Visit | Attending: Orthopedic Surgery | Admitting: Orthopedic Surgery

## 2015-05-02 DIAGNOSIS — Z96649 Presence of unspecified artificial hip joint: Secondary | ICD-10-CM

## 2015-05-02 DIAGNOSIS — I10 Essential (primary) hypertension: Secondary | ICD-10-CM | POA: Diagnosis present

## 2015-05-02 DIAGNOSIS — Z79899 Other long term (current) drug therapy: Secondary | ICD-10-CM | POA: Diagnosis not present

## 2015-05-02 DIAGNOSIS — M1612 Unilateral primary osteoarthritis, left hip: Secondary | ICD-10-CM | POA: Diagnosis not present

## 2015-05-02 DIAGNOSIS — M169 Osteoarthritis of hip, unspecified: Secondary | ICD-10-CM | POA: Diagnosis present

## 2015-05-02 DIAGNOSIS — M25552 Pain in left hip: Secondary | ICD-10-CM | POA: Diagnosis present

## 2015-05-02 DIAGNOSIS — Z7982 Long term (current) use of aspirin: Secondary | ICD-10-CM | POA: Diagnosis not present

## 2015-05-02 DIAGNOSIS — Z9660 Presence of unspecified orthopedic joint implant: Secondary | ICD-10-CM

## 2015-05-02 DIAGNOSIS — K219 Gastro-esophageal reflux disease without esophagitis: Secondary | ICD-10-CM | POA: Diagnosis not present

## 2015-05-02 DIAGNOSIS — Z87891 Personal history of nicotine dependence: Secondary | ICD-10-CM | POA: Diagnosis not present

## 2015-05-02 DIAGNOSIS — R079 Chest pain, unspecified: Secondary | ICD-10-CM

## 2015-05-02 DIAGNOSIS — Z01812 Encounter for preprocedural laboratory examination: Secondary | ICD-10-CM

## 2015-05-02 HISTORY — PX: TOTAL HIP ARTHROPLASTY: SHX124

## 2015-05-02 LAB — TYPE AND SCREEN
ABO/RH(D): A POS
ANTIBODY SCREEN: NEGATIVE

## 2015-05-02 LAB — TROPONIN I
Troponin I: <0.03 ng/mL
Troponin I: <0.03 ng/mL (ref <0.031)

## 2015-05-02 LAB — CBC
HEMATOCRIT: 37.5 % — AB (ref 39.0–52.0)
HEMOGLOBIN: 12.2 g/dL — AB (ref 13.0–17.0)
MCH: 28.8 pg (ref 26.0–34.0)
MCHC: 32.5 g/dL (ref 30.0–36.0)
MCV: 88.4 fL (ref 78.0–100.0)
Platelets: 318 10*3/uL (ref 150–400)
RBC: 4.24 MIL/uL (ref 4.22–5.81)
RDW: 13.2 % (ref 11.5–15.5)
WBC: 13.7 10*3/uL — ABNORMAL HIGH (ref 4.0–10.5)

## 2015-05-02 LAB — BASIC METABOLIC PANEL
ANION GAP: 8 (ref 5–15)
BUN: 7 mg/dL (ref 6–20)
CALCIUM: 8.8 mg/dL — AB (ref 8.9–10.3)
CO2: 30 mmol/L (ref 22–32)
Chloride: 105 mmol/L (ref 101–111)
Creatinine, Ser: 0.65 mg/dL (ref 0.61–1.24)
GFR calc non Af Amer: >60 mL/min (ref >60)
GLUCOSE: 134 mg/dL — AB (ref 65–99)
POTASSIUM: 3.9 mmol/L (ref 3.5–5.1)
Sodium: 143 mmol/L (ref 135–145)

## 2015-05-02 SURGERY — ARTHROPLASTY, HIP, TOTAL, ANTERIOR APPROACH
Anesthesia: Spinal | Site: Hip | Laterality: Left

## 2015-05-02 MED ORDER — LACTATED RINGERS IV SOLN
INTRAVENOUS | Status: DC | PRN
  Administered 2015-05-02 (×2): via INTRAVENOUS

## 2015-05-02 MED ORDER — BUPIVACAINE HCL (PF) 0.25 % IJ SOLN
INTRAMUSCULAR | Status: DC | PRN
  Administered 2015-05-02: 30 mL

## 2015-05-02 MED ORDER — ACETAMINOPHEN 500 MG PO TABS
1000.0000 mg | ORAL_TABLET | Freq: Four times a day (QID) | ORAL | Status: AC
  Administered 2015-05-02 – 2015-05-03 (×4): 1000 mg via ORAL
  Filled 2015-05-02 (×4): qty 2

## 2015-05-02 MED ORDER — ACETAMINOPHEN 10 MG/ML IV SOLN
1000.0000 mg | Freq: Once | INTRAVENOUS | Status: AC
  Administered 2015-05-02: 1000 mg via INTRAVENOUS

## 2015-05-02 MED ORDER — HYDROMORPHONE HCL 1 MG/ML IJ SOLN
0.2500 mg | INTRAMUSCULAR | Status: DC | PRN
  Administered 2015-05-02 (×4): 0.5 mg via INTRAVENOUS

## 2015-05-02 MED ORDER — METOCLOPRAMIDE HCL 5 MG/ML IJ SOLN: 5.0000 mg | Freq: Three times a day (TID) | INTRAMUSCULAR | Status: DC | PRN

## 2015-05-02 MED ORDER — SODIUM CHLORIDE 0.9 % IV SOLN
INTRAVENOUS | Status: DC
  Administered 2015-05-02: 100 mL via INTRAVENOUS
  Administered 2015-05-03: 01:00:00 via INTRAVENOUS

## 2015-05-02 MED ORDER — MEPERIDINE HCL 50 MG/ML IJ SOLN: 6.2500 mg | INTRAMUSCULAR | Status: DC | PRN

## 2015-05-02 MED ORDER — TRAMADOL HCL 50 MG PO TABS
50.0000 mg | ORAL_TABLET | Freq: Four times a day (QID) | ORAL | Status: DC | PRN
  Filled 2015-05-02: qty 2

## 2015-05-02 MED ORDER — PROPOFOL 10 MG/ML IV BOLUS
INTRAVENOUS | Status: AC
  Filled 2015-05-02: qty 40

## 2015-05-02 MED ORDER — DEXAMETHASONE SODIUM PHOSPHATE 10 MG/ML IJ SOLN
10.0000 mg | Freq: Once | INTRAMUSCULAR | Status: AC
  Administered 2015-05-03: 10 mg via INTRAVENOUS
  Filled 2015-05-02: qty 1

## 2015-05-02 MED ORDER — MENTHOL 3 MG MT LOZG: 1.0000 | LOZENGE | OROMUCOSAL | Status: DC | PRN

## 2015-05-02 MED ORDER — CEFAZOLIN SODIUM-DEXTROSE 2-3 GM-% IV SOLR
INTRAVENOUS | Status: AC
  Filled 2015-05-02: qty 50

## 2015-05-02 MED ORDER — BUPIVACAINE IN DEXTROSE 0.75-8.25 % IT SOLN
INTRATHECAL | Status: DC | PRN
  Administered 2015-05-02: 2 mL via INTRATHECAL

## 2015-05-02 MED ORDER — ROPINIROLE HCL 0.5 MG PO TABS
0.5000 mg | ORAL_TABLET | Freq: Every evening | ORAL | Status: DC | PRN
  Filled 2015-05-02: qty 1

## 2015-05-02 MED ORDER — RIVAROXABAN 10 MG PO TABS
10.0000 mg | ORAL_TABLET | Freq: Every day | ORAL | Status: DC
  Administered 2015-05-03: 10 mg via ORAL
  Filled 2015-05-02 (×2): qty 1

## 2015-05-02 MED ORDER — METHOCARBAMOL 500 MG PO TABS
500.0000 mg | ORAL_TABLET | Freq: Four times a day (QID) | ORAL | Status: DC | PRN
  Administered 2015-05-03: 500 mg via ORAL
  Filled 2015-05-02 (×2): qty 1

## 2015-05-02 MED ORDER — ACETAMINOPHEN 650 MG RE SUPP: 650.0000 mg | Freq: Four times a day (QID) | RECTAL | Status: DC | PRN

## 2015-05-02 MED ORDER — METOCLOPRAMIDE HCL 10 MG PO TABS: 5.0000 mg | ORAL_TABLET | Freq: Three times a day (TID) | ORAL | Status: DC | PRN

## 2015-05-02 MED ORDER — PROPOFOL 10 MG/ML IV BOLUS
INTRAVENOUS | Status: AC
  Filled 2015-05-02: qty 20

## 2015-05-02 MED ORDER — MIDAZOLAM HCL 2 MG/2ML IJ SOLN
INTRAMUSCULAR | Status: AC
  Filled 2015-05-02: qty 2

## 2015-05-02 MED ORDER — CEFAZOLIN SODIUM-DEXTROSE 2-3 GM-% IV SOLR
2.0000 g | Freq: Four times a day (QID) | INTRAVENOUS | Status: AC
  Administered 2015-05-02 (×2): 2 g via INTRAVENOUS
  Filled 2015-05-02 (×2): qty 50

## 2015-05-02 MED ORDER — POLYETHYLENE GLYCOL 3350 17 G PO PACK: 17.0000 g | PACK | Freq: Every day | ORAL | Status: DC | PRN

## 2015-05-02 MED ORDER — TRANEXAMIC ACID 1000 MG/10ML IV SOLN
1000.0000 mg | INTRAVENOUS | Status: AC
  Administered 2015-05-02: 1000 mg via INTRAVENOUS
  Filled 2015-05-02: qty 10

## 2015-05-02 MED ORDER — LACTATED RINGERS IV SOLN
Freq: Once | INTRAVENOUS | Status: AC
  Administered 2015-05-02: 12:00:00 via INTRAVENOUS

## 2015-05-02 MED ORDER — MIDAZOLAM HCL 2 MG/2ML IJ SOLN: 0.5000 mg | Freq: Once | INTRAMUSCULAR | Status: DC | PRN

## 2015-05-02 MED ORDER — STERILE WATER FOR IRRIGATION IR SOLN
Status: DC | PRN
  Administered 2015-05-02: 2000 mL

## 2015-05-02 MED ORDER — PHENYLEPHRINE HCL 10 MG/ML IJ SOLN
INTRAMUSCULAR | Status: AC
  Filled 2015-05-02: qty 1

## 2015-05-02 MED ORDER — NITROGLYCERIN 0.4 MG SL SUBL
SUBLINGUAL_TABLET | SUBLINGUAL | Status: AC
  Filled 2015-05-02: qty 1

## 2015-05-02 MED ORDER — CEFAZOLIN SODIUM-DEXTROSE 2-3 GM-% IV SOLR
2.0000 g | INTRAVENOUS | Status: AC
  Administered 2015-05-02: 2 g via INTRAVENOUS

## 2015-05-02 MED ORDER — ONDANSETRON HCL 4 MG/2ML IJ SOLN: 4.0000 mg | Freq: Four times a day (QID) | INTRAMUSCULAR | Status: DC | PRN

## 2015-05-02 MED ORDER — PROPOFOL 500 MG/50ML IV EMUL
INTRAVENOUS | Status: DC | PRN
  Administered 2015-05-02: 100 ug/kg/min via INTRAVENOUS

## 2015-05-02 MED ORDER — DEXAMETHASONE SODIUM PHOSPHATE 10 MG/ML IJ SOLN
10.0000 mg | Freq: Once | INTRAMUSCULAR | Status: AC
  Administered 2015-05-02: 10 mg via INTRAVENOUS

## 2015-05-02 MED ORDER — BISACODYL 10 MG RE SUPP: 10.0000 mg | Freq: Every day | RECTAL | Status: DC | PRN

## 2015-05-02 MED ORDER — DEXTROSE 5 % IV SOLN
500.0000 mg | Freq: Four times a day (QID) | INTRAVENOUS | Status: DC | PRN
  Administered 2015-05-02: 500 mg via INTRAVENOUS
  Filled 2015-05-02 (×2): qty 5

## 2015-05-02 MED ORDER — HYDROMORPHONE HCL 1 MG/ML IJ SOLN
INTRAMUSCULAR | Status: AC
  Filled 2015-05-02: qty 1

## 2015-05-02 MED ORDER — BUPIVACAINE HCL (PF) 0.25 % IJ SOLN
INTRAMUSCULAR | Status: AC
  Filled 2015-05-02: qty 30

## 2015-05-02 MED ORDER — FENTANYL CITRATE (PF) 100 MCG/2ML IJ SOLN
INTRAMUSCULAR | Status: AC
  Filled 2015-05-02: qty 2

## 2015-05-02 MED ORDER — PANTOPRAZOLE SODIUM 40 MG PO TBEC
40.0000 mg | DELAYED_RELEASE_TABLET | Freq: Every day | ORAL | Status: DC
  Administered 2015-05-02: 40 mg via ORAL
  Filled 2015-05-02 (×2): qty 1

## 2015-05-02 MED ORDER — AMLODIPINE BESYLATE 10 MG PO TABS
10.0000 mg | ORAL_TABLET | Freq: Every day | ORAL | Status: DC
  Administered 2015-05-02: 10 mg via ORAL
  Filled 2015-05-02: qty 1

## 2015-05-02 MED ORDER — 0.9 % SODIUM CHLORIDE (POUR BTL) OPTIME
TOPICAL | Status: DC | PRN
  Administered 2015-05-02: 1000 mL

## 2015-05-02 MED ORDER — DOCUSATE SODIUM 100 MG PO CAPS
100.0000 mg | ORAL_CAPSULE | Freq: Two times a day (BID) | ORAL | Status: DC
  Administered 2015-05-02 – 2015-05-03 (×2): 100 mg via ORAL
  Filled 2015-05-02 (×2): qty 1

## 2015-05-02 MED ORDER — ACETAMINOPHEN 10 MG/ML IV SOLN
INTRAVENOUS | Status: AC
  Filled 2015-05-02: qty 100

## 2015-05-02 MED ORDER — FLEET ENEMA 7-19 GM/118ML RE ENEM: 1.0000 | ENEMA | Freq: Once | RECTAL | Status: DC | PRN

## 2015-05-02 MED ORDER — PHENYLEPHRINE HCL 10 MG/ML IJ SOLN
10.0000 mg | INTRAMUSCULAR | Status: DC | PRN
  Administered 2015-05-02: 30 ug/min via INTRAVENOUS

## 2015-05-02 MED ORDER — DIPHENHYDRAMINE HCL 12.5 MG/5ML PO ELIX: 12.5000 mg | ORAL_SOLUTION | ORAL | Status: DC | PRN

## 2015-05-02 MED ORDER — ONDANSETRON HCL 4 MG PO TABS: 4.0000 mg | ORAL_TABLET | Freq: Four times a day (QID) | ORAL | Status: DC | PRN

## 2015-05-02 MED ORDER — MIDAZOLAM HCL 5 MG/5ML IJ SOLN
INTRAMUSCULAR | Status: DC | PRN
  Administered 2015-05-02: 2 mg via INTRAVENOUS

## 2015-05-02 MED ORDER — ACETAMINOPHEN 325 MG PO TABS: 650.0000 mg | ORAL_TABLET | Freq: Four times a day (QID) | ORAL | Status: DC | PRN

## 2015-05-02 MED ORDER — OXYCODONE HCL 5 MG PO TABS
5.0000 mg | ORAL_TABLET | ORAL | Status: DC | PRN
  Administered 2015-05-02 – 2015-05-03 (×3): 10 mg via ORAL
  Filled 2015-05-02 (×3): qty 2

## 2015-05-02 MED ORDER — PROMETHAZINE HCL 25 MG/ML IJ SOLN: 6.2500 mg | INTRAMUSCULAR | Status: DC | PRN

## 2015-05-02 MED ORDER — PROPOFOL 10 MG/ML IV BOLUS
INTRAVENOUS | Status: DC | PRN
  Administered 2015-05-02: 30 mg via INTRAVENOUS

## 2015-05-02 MED ORDER — NITROGLYCERIN 0.4 MG SL SUBL
0.4000 mg | SUBLINGUAL_TABLET | SUBLINGUAL | Status: DC | PRN
  Administered 2015-05-02: 0.4 mg via SUBLINGUAL

## 2015-05-02 MED ORDER — DIAZEPAM 2 MG PO TABS
4.0000 mg | ORAL_TABLET | Freq: Every evening | ORAL | Status: DC | PRN
  Administered 2015-05-03: 4 mg via ORAL
  Filled 2015-05-02: qty 2

## 2015-05-02 MED ORDER — MORPHINE SULFATE (PF) 2 MG/ML IV SOLN
1.0000 mg | INTRAVENOUS | Status: DC | PRN
  Administered 2015-05-02 (×3): 1 mg via INTRAVENOUS
  Filled 2015-05-02 (×4): qty 1

## 2015-05-02 MED ORDER — FENTANYL CITRATE (PF) 100 MCG/2ML IJ SOLN
INTRAMUSCULAR | Status: DC | PRN
  Administered 2015-05-02: 100 ug via INTRAVENOUS

## 2015-05-02 MED ORDER — PHENOL 1.4 % MT LIQD: 1.0000 | OROMUCOSAL | Status: DC | PRN

## 2015-05-02 SURGICAL SUPPLY — 31 items
BLADE SAG 18X100X1.27 (BLADE) ×2 IMPLANT
CAPT HIP TOTAL 2 ×2 IMPLANT
CLOTH BEACON ORANGE TIMEOUT ST (SAFETY) ×2 IMPLANT
COVER PERINEAL POST (MISCELLANEOUS) ×2 IMPLANT
DRAPE STERI IOBAN 125X83 (DRAPES) ×2 IMPLANT
DRAPE U-SHAPE 47X51 STRL (DRAPES) ×4 IMPLANT
DRSG ADAPTIC 3X8 NADH LF (GAUZE/BANDAGES/DRESSINGS) ×2 IMPLANT
DRSG MEPILEX BORDER 4X4 (GAUZE/BANDAGES/DRESSINGS) ×2 IMPLANT
DRSG MEPILEX BORDER 4X8 (GAUZE/BANDAGES/DRESSINGS) ×4 IMPLANT
DURAPREP 26ML APPLICATOR (WOUND CARE) ×2 IMPLANT
ELECT REM PT RETURN 9FT ADLT (ELECTROSURGICAL) ×2
ELECTRODE REM PT RTRN 9FT ADLT (ELECTROSURGICAL) ×1 IMPLANT
EVACUATOR 1/8 PVC DRAIN (DRAIN) ×2 IMPLANT
GLOVE BIO SURGEON STRL SZ7.5 (GLOVE) ×6 IMPLANT
GLOVE BIO SURGEON STRL SZ8 (GLOVE) ×4 IMPLANT
GLOVE BIOGEL PI IND STRL 7.5 (GLOVE) ×1 IMPLANT
GLOVE BIOGEL PI IND STRL 8 (GLOVE) ×2 IMPLANT
GLOVE BIOGEL PI INDICATOR 7.5 (GLOVE) ×1
GLOVE BIOGEL PI INDICATOR 8 (GLOVE) ×2
GLOVE SURG SS PI 7.5 STRL IVOR (GLOVE) ×2 IMPLANT
GOWN STRL REUS W/TWL LRG LVL3 (GOWN DISPOSABLE) ×4 IMPLANT
GOWN STRL REUS W/TWL XL LVL3 (GOWN DISPOSABLE) ×4 IMPLANT
PACK ANTERIOR HIP CUSTOM (KITS) ×2 IMPLANT
STRIP CLOSURE SKIN 1/2X4 (GAUZE/BANDAGES/DRESSINGS) ×2 IMPLANT
SUT ETHIBOND NAB CT1 #1 30IN (SUTURE) ×2 IMPLANT
SUT MNCRL AB 4-0 PS2 18 (SUTURE) ×2 IMPLANT
SUT VIC AB 2-0 CT1 27 (SUTURE) ×2
SUT VIC AB 2-0 CT1 TAPERPNT 27 (SUTURE) ×2 IMPLANT
SUT VLOC 180 0 24IN GS25 (SUTURE) ×2 IMPLANT
TRAY FOLEY W/METER SILVER 16FR (SET/KITS/TRAYS/PACK) ×2 IMPLANT
YANKAUER SUCT BULB TIP 10FT TU (MISCELLANEOUS) ×2 IMPLANT

## 2015-05-02 NOTE — Anesthesia Procedure Notes (Signed)
Spinal Patient location during procedure: OR End time: 05/02/2015 8:35 AM Staffing Resident/CRNA: Enrigue Catena E Performed by: anesthesiologist  Preanesthetic Checklist Completed: patient identified, site marked, surgical consent, pre-op evaluation, timeout performed, IV checked, risks and benefits discussed and monitors and equipment checked Spinal Block Patient position: sitting Prep: Betadine Patient monitoring: heart rate, continuous pulse ox and blood pressure Approach: right paramedian Location: L4-5 Injection technique: single-shot Needle Needle type: Spinocan  Needle gauge: 22 G Needle length: 9 cm Assessment Sensory level: T4 Additional Notes Expiration date of kit checked and confirmed. Patient tolerated procedure well, without complications.

## 2015-05-02 NOTE — Transfer of Care (Signed)
Immediate Anesthesia Transfer of Care Note  Patient: [name]  Procedure(s) Performed: Procedure(s): TOTAL LEFT HIP ARTHROPLASTY ANTERIOR APPROACH (Left)  Patient Location: PACU  Anesthesia Type:Spinal  Level of Consciousness: awake, alert , oriented and patient cooperative  Airway & Oxygen Therapy: Patient Spontanous Breathing and Patient connected to face mask oxygen  Post-op Assessment: Vital signs  stable  Post vital signs: stable  Last Vitals:  Filed Vitals:   05/02/15 0645  BP: 124/78  Pulse: 76  Temp: 36.9 C  Resp: 18    Complications: No apparent anesthesia complications L1 spinal level

## 2015-05-02 NOTE — Progress Notes (Signed)
Utilization review completed.  

## 2015-05-02 NOTE — H&P (View-Only) (Signed)
[name] DOB: 07-03-1945 Married / Language: English / Race: White Male Date of Admission:  05/02/2015 CC:  Left Hip Pain History of Present Illness The patient is a 70 year old male who comes in for a preoperative History and Physical. The patient is scheduled for a left total hip arthroplasty (anterior) to be performed by Dr. Dione Plover. Aluisio, MD at [facility] on 05-02-2015. The patient is a 70 year old male who presented for follow up of their hip. The patient is being followed for their left hip pain and osteoarthritis. They are month(s) out from last intra-articular injection. Symptoms reported include: pain and aching. The patient feels that they are doing poorly and report their pain level to be moderate. The following medication has been used for pain control: Hydrocodone. The patient has not gotten any relief of their symptoms with Cortisone injections (the last injection really only helped for a few days). The patient indicates that they are ready to get the hip fixed. He states the hip has gotten progressively worse over the past year. He is eagerly anticipating getting his hip replaced. He has had injections into the hip but have only provided temporary relief. They have been treated conservatively in the past for the above stated problem and despite conservative measures, they continue to have progressive pain and severe functional limitations and dysfunction. They have failed non-operative management including home exercise, medications, and injections. It is felt that they would benefit from undergoing total joint replacement. Risks and benefits of the procedure have been discussed with the patient and they elect to proceed with surgery. There are no active contraindications to surgery such as ongoing infection or rapidly progressive neurological disease.  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)  Primary osteoarthritis of left hip (M16.12)  Pain, Lumbar (LBP) (724.2)  Skin Cancer   High blood pressure  Gastroesophageal Reflux Disease  Osteoarthritis  Kidney Stone  Tinnitus  Measles  Childhood  Allergies Ambien *HYPNOTICS*  horrible dreams Hydrocodone-Acetaminophen *ANALGESICS - OPIOID*  "feel thick" in the head Sulfanilamide *CHEMICALS*  Sulfa drugs - "feels thick" in the head  Family History  Cerebrovascular Accident  father Hypertension  mother and father Osteoarthritis  sister and brother Other medical problems  TWIN SISTERS AND BROTHER WITH LEG PERTHES, MOTHER WITH IRREGULAR HEART BEAT Rheumatoid Arthritis  mother Severe allergy  mother  Social History  Number of flights of stairs before winded  2-3 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  Medication History Aspirin EC (81MG  Tablet DR, Oral) Active. Omeprazole (Oral) Specific strength unknown - Active. Norvasc (Oral) Specific strength unknown - Active. CeleBREX (200MG  Capsule, Oral) Active. TraMADol HCl (50MG  Tablet, Oral) Active. Requip (0.5MG  Tablet, Oral) Active. DiazePAM (2MG  Tablet, Oral) Active. Multivital (Oral) Active.  Past Surgical History  Total Hip Replacement  Date: 2010. right Other Surgery  Date: 64. TOTAL LEFT  ACHILLES TENDON REPAIR Right Rotator Cuff  Repair  Date: 2012. Dr. Onnie Graham   Review of Systems General Not Present- Chills, Fatigue, Fever, Memory Loss, Night Sweats, Weight Gain and Weight Loss. Skin Not Present- Eczema, Hives, Itching, Lesions and Rash. HEENT Present- Tinnitus (Left Ear). Not Present- Dentures, Double Vision, Headache, Hearing Loss and Visual Loss. Respiratory Not Present- Allergies, Chronic Cough, Coughing up blood, Shortness of breath at rest and Shortness of breath with exertion. Cardiovascular Not Present- Chest Pain, Difficulty Breathing Lying Down, Murmur, Palpitations, Racing/skipping heartbeats and Swelling. Gastrointestinal Not Present- Abdominal Pain, Bloody Stool, Constipation, Diarrhea, Difficulty Swallowing, Heartburn, Jaundice, Loss of appetitie, Nausea and Vomiting. Male Genitourinary Present- Urinating at Night. Not Present- Blood in Urine, Discharge, Flank Pain, Incontinence, Painful Urination, Urgency, Urinary frequency, Urinary Retention and Weak urinary stream. Musculoskeletal Present- Back Pain and Joint Pain. Not Present- Joint Swelling, Morning Stiffness, Muscle Pain, Muscle Weakness and Spasms. Neurological Not Present- Blackout spells, Difficulty with balance, Dizziness, Paralysis, Tremor and Weakness. Psychiatric Not Present- Insomnia.  Vitals Weight: 165 lb Height: 67in Weight was reported by patient. Height was reported by patient. Body Surface Area: 1.86 m Body Mass Index: 25.84 kg/m  Pulse: 60 (Regular)  BP: 110/62 (Sitting, Right Arm, Standard)   Physical Exam General Mental Status -Alert, cooperative and good historian. General Appearance-pleasant, Not in acute distress. Orientation-Oriented X3. Build & Nutrition-Well nourished and Well developed.  Head and Neck Head-normocephalic, atraumatic . Neck Global Assessment - supple, no bruit auscultated on the right, no bruit auscultated on the left.  Eye Vision-Wears corrective lenses. Pupil -  Bilateral-Regular and Round. Motion - Bilateral-EOMI.  Chest and Lung Exam Auscultation Breath sounds - clear at anterior chest wall and clear at posterior chest wall. Adventitious sounds - No Adventitious sounds.  Cardiovascular Auscultation Rhythm - Regular rate and rhythm. Heart Sounds - S1 WNL and S2 WNL. Murmurs & Other Heart Sounds - Auscultation of the heart reveals - No Murmurs.  Abdomen Palpation/Percussion Tenderness - Abdomen is non-tender to palpation. Rigidity (guarding) - Abdomen is soft. Auscultation Auscultation of the abdomen reveals - Bowel sounds normal.  Male Genitourinary Note: Not done, not pertinent to present illness   Musculoskeletal Note: He is alert and oriented, in no apparent distress. His left hip can be flexed to about 110, minimal internal rotation, about 20 external rotation, 20 abduction.  IMAGING We reviewed his radiographs of last month. He is now completely bone on bone in that left hip with subchondral cystic formation.   Assessment & Plan Primary osteoarthritis of left hip (M16.12) Status post right hip replacement [id number])  Note:Surgical Plans: Left Total Hip Replacement - Anterior Approach  Disposition: Home  PCP: Dr. [name] - Patient has been seen preoperatively and felt to be stable for surgery.  IV TXA  Anesthesia Issues: None  Signed electronically by Ok Edwards, III PA-C

## 2015-05-02 NOTE — Anesthesia Postprocedure Evaluation (Signed)
Anesthesia Post Note  Patient: [name]  Procedure(s) Performed: Procedure(s) (LRB): TOTAL LEFT HIP ARTHROPLASTY ANTERIOR APPROACH (Left)  Patient location during evaluation: PACU Anesthesia Type: Spinal Level of consciousness: awake and alert, oriented and patient cooperative Pain management: pain level controlled Vital Signs Assessment: post-procedure vital signs reviewed and stable Respiratory status: spontaneous breathing, nonlabored ventilation, respiratory function stable and patient connected to nasal cannula oxygen Cardiovascular status: blood pressure returned to baseline and stable Postop Assessment: no headache, no backache, spinal receding, patient able to bend at knees and no signs of nausea or vomiting Anesthetic complications: no Comments: Pt had single episode of Substernal L sided chest heaviness and pressure in PACU.  VSS, O2 say 100% on [place] O2.  The pain did not radiate, no assoc diaphoresis or nausea.  Monitor and 12 lead EKG totally normal, without dysrhythmia or ST deviation.  Pain relieved with sl NTG.  Dr Wynelle Link aware, and agrees to cardiology consult and telemetry monitoring post op.   Jenita Seashore, MD    Last Vitals:  Filed Vitals:   05/02/15 1230 05/02/15 1245  BP: 125/78 117/78  Pulse: 62 63  Temp:    Resp: 12 14    Last Pain:  Filed Vitals:   05/02/15 1255  PainSc: 4                  [name],E. Jayshawn Colston

## 2015-05-02 NOTE — Op Note (Signed)
OPERATIVE REPORT  PREOPERATIVE DIAGNOSIS: Osteoarthritis of the Left hip.   POSTOPERATIVE DIAGNOSIS: Osteoarthritis of the Left  hip.   PROCEDURE: Left total hip arthroplasty, anterior approach.   SURGEON: Gaynelle Arabian, MD   ASSISTANT: Arlee Muslim, PA-C  ANESTHESIA:  Spinal  ESTIMATED BLOOD LOSS:-300 ml   DRAINS: Hemovac x1.   COMPLICATIONS: None   CONDITION: PACU - hemodynamically stable.   BRIEF CLINICAL NOTE: [name] is a 70 y.o. male who has advanced end-  stage arthritis of his Left  hip with progressively worsening pain and  dysfunction.The patient has failed nonoperative management and presents for  total hip arthroplasty.   PROCEDURE IN DETAIL: After successful administration of spinal  anesthetic, the traction boots for the [facility] bed were placed on both  feet and the patient was placed onto the [facility] bed, boots placed into the leg  holders. The Left hip was then isolated from the perineum with plastic  drapes and prepped and draped in the usual sterile fashion. ASIS and  greater trochanter were marked and a oblique incision was made, starting  at about 1 cm lateral and 2 cm distal to the ASIS and coursing towards  the anterior cortex of the femur. The skin was cut with a 10 blade  through subcutaneous tissue to the level of the fascia overlying the  tensor fascia lata muscle. The fascia was then incised in line with the  incision at the junction of the anterior third and posterior 2/3rd. The  muscle was teased off the fascia and then the interval between the TFL  and the rectus was developed. The Hohmann retractor was then placed at  the top of the femoral neck over the capsule. The vessels overlying the  capsule were cauterized and the fat on top of the capsule was removed.  A Hohmann retractor was then placed anterior underneath the rectus  femoris to give exposure to the entire anterior capsule. A T-shaped  capsulotomy was performed. The edges  were tagged and the femoral head  was identified.       Osteophytes are removed off the superior acetabulum.  The femoral neck was then cut in situ with an oscillating saw. Traction  was then applied to the left lower extremity utilizing the [facility]  traction. The femoral head was then removed. Retractors were placed  around the acetabulum and then circumferential removal of the labrum was  performed. Osteophytes were also removed. Reaming starts at 47 mm to  medialize and  Increased in 2 mm increments to 51 mm. We reamed in  approximately 40 degrees of abduction, 20 degrees anteversion. A 52 mm  pinnacle acetabular shell was then impacted in anatomic position under  fluoroscopic guidance with excellent purchase. We did not need to place  any additional dome screws. A 32 mm neutral + 4 marathon liner was then  placed into the acetabular shell.       The femoral lift was then placed along the lateral aspect of the femur  just distal to the vastus ridge. The leg was  externally rotated and capsule  was stripped off the inferior aspect of the femoral neck down to the  level of the lesser trochanter, this was done with electrocautery. The femur was lifted after this was performed. The  leg was then placed and extended in adducted position to essentially delivering the femur. We also removed the capsule superiorly and the  piriformis from the piriformis  fossa to gain excellent exposure of the  proximal femur. Rongeur was used to remove some cancellous bone to get  into the lateral portion of the proximal femur for placement of the  initial starter reamer. The starter broaches was placed  the starter broach  and was shown to go down the center of the canal. Broaching  with the  Corail system was then performed starting at size 8, coursing  Up to size 13. A size 13 had excellent torsional and rotational  and axial stability. The trial standard offset neck was then placed  with a 32 + 1 trial head.  The hip was then reduced. We confirmed that  the stem was in the canal both on AP and lateral x-rays. It also has excellent sizing. The hip was reduced with outstanding stability through full extension, full external rotation,  and then flexion in adduction internal rotation. AP pelvis was taken  and the leg lengths were measured and found to be exactly equal. Hip  was then dislocated again and the femoral head and neck removed. The  femoral broach was removed. Size 13 Corail stem with a standard offset  neck was then impacted into the femur following native anteversion. Has  excellent purchase in the canal. Excellent torsional and rotational and  axial stability. It is confirmed to be in the canal on AP and lateral  fluoroscopic views. The 32 + 1 ceramic head was placed and the hip  reduced with outstanding stability. Again AP pelvis was taken and it  confirmed that the leg lengths were equal. The wound was then copiously  irrigated with saline solution and the capsule reattached and repaired  with Ethibond suture. 30 ml of .25% Bupivicaine injected into the capsule and into the edge of the tensor fascia lata as well as subcutaneous tissue. The fascia overlying the tensor fascia lata was  then closed with a running #1 V-Loc. Subcu was closed with interrupted  2-0 Vicryl and subcuticular running 4-0 Monocryl. Incision was cleaned  and dried. Steri-Strips and a bulky sterile dressing applied. Hemovac  drain was hooked to suction and then he was awakened and transported to  recovery in stable condition.        Please note that a surgical assistant was a medical necessity for this procedure to perform it in a safe and expeditious manner. Assistant was necessary to provide appropriate retraction of vital neurovascular structures and to prevent femoral fracture and allow for anatomic placement of the prosthesis.  Gaynelle Arabian, M.D.

## 2015-05-02 NOTE — Anesthesia Preprocedure Evaluation (Addendum)
Anesthesia Evaluation  Patient identified by MRN, date of birth, ID band Patient awake    Reviewed: Allergy & Precautions, NPO status , Patient's Chart, lab work & pertinent test results  History of Anesthesia Complications Negative for: history of anesthetic complications  Airway Mallampati: II  TM Distance: >3 FB Neck ROM: Full    Dental  (+) Dental Advisory Given   Pulmonary former smoker (quit 1986),    breath sounds clear to auscultation       Cardiovascular hypertension, Pt. on medications (-) angina Rhythm:Regular Rate:Normal     Neuro/Psych negative neurological ROS  negative psych ROS   GI/Hepatic Neg liver ROS, GERD  Medicated and Controlled,  Endo/Other  negative endocrine ROS  Renal/GU negative Renal ROS     Musculoskeletal  (+) Arthritis , Osteoarthritis,    Abdominal   Peds  Hematology negative hematology ROS (+)   Anesthesia Other Findings   Reproductive/Obstetrics                            Anesthesia Physical Anesthesia Plan  ASA: II  Anesthesia Plan: Spinal   Post-op Pain Management:    Induction:   Airway Management Planned: Natural Airway and Simple Face Mask  Additional Equipment:   Intra-op Plan:   Post-operative Plan:   Informed Consent: I have reviewed the patients History and Physical, chart, labs and discussed the procedure including the risks, benefits and alternatives for the proposed anesthesia with the patient or authorized representative who has indicated his/her understanding and acceptance.   Dental advisory given  Plan Discussed with: CRNA and Surgeon  Anesthesia Plan Comments: (Plan routine monitors, SAB, pt prefers heavy sedation)        Anesthesia Quick Evaluation

## 2015-05-02 NOTE — Interval H&P Note (Signed)
History and Physical Interval Note:  05/02/2015 7:42 AM  [name]  has presented today for surgery, with the diagnosis of OA LEFT HIP  The various methods of treatment have been discussed with the patient and family. After consideration of risks, benefits and other options for treatment, the patient has consented to  Procedure(s): TOTAL LEFT HIP ARTHROPLASTY ANTERIOR APPROACH (Left) as a surgical intervention .  The patient's history has been reviewed, patient examined, no change in status, stable for surgery.  I have reviewed the patient's chart and labs.  Questions were answered to the patient's satisfaction.     Gearlean Alf

## 2015-05-02 NOTE — Progress Notes (Signed)
Pt had single episode of Substernal L sided chest heaviness and pressure in PACU.  VSS, O2 say 100% on  O2.  The pain did not radiate, no assoc diaphoresis or nausea.  Monitor and 12 lead EKG totally normal, without dysrhythmia or ST deviation.  Pain relieved with sl NTG.  Dr Wynelle Link aware, and agrees to cardiology consult and telemetry monitoring post op.   Jenita Seashore, MD

## 2015-05-02 NOTE — Progress Notes (Signed)
Cardiology here- to see patient on floor

## 2015-05-02 NOTE — Consult Note (Signed)
Reason for Consult: chest pain   Referring Physician: Dr. Wynelle Link    PCP:  Elsie Stain, MD  Primary Cardiologist:[name] is an 70 y.o. male.    Chief Complaint:  Pt here for Lt total hip arthroplasty which he had done.  HPI: 70 year old male with no hx of CAD, MI, VA or HF.  Prior to surgery no chest pain.  He does have HTN treated.  He has had problems ambulating with his arthritic hip.  He did see Dr. Irish Lack years ago after syncope and Echo was normal.  EF was 65%.  No further syncope.  Earlier this month he was on mission trip to [place] and was carrying concrete blocks without any problems.  No prior chest pain and no SOB.  Today did well in OR and coming to PACU he developed Lt ant chest pain that then radiated laterally then back and came up head.  He was given 1 SL NTG and it resolved. Also given protonix and does have hx of GERD.  EKG without acute changes.  Currently pain free.   Past Medical History  Diagnosis Date  . Syncope     negative cardiac w/u per Dr Irish Lack  . OA (osteoarthritis)     with r hip replacement  . Nephrolithiasis   . Tick bite 2011    likely tick associated illness, treated with doxy with resolution,  . GERD (gastroesophageal reflux disease)   . Viral exanthem, unspecified   . Osteoarthrosis involving, or with mention of more than one site, but not specified as generalized, site unspecified(715.80)   . History of BPH   . Hyperlipidemia   . Hypertension   . Adjustment reaction with physical symptoms   . Elevated PSA     h/o with normal bx per URO  . Cancer, skin, squamous cell     R shoulder, removed per derm 2012    Past Surgical History  Procedure Laterality Date  . Total hip arthroplasty      right  . Achilles tendon repair  1984  . Shoulder arthroscopy  2012    R rotator cuff (biceps tear not repaired)  . Joint replacement    . Skin cancer excision      right shoulder    Family History  Problem  Relation Age of Onset  . Prostate cancer Maternal Uncle   . Colon cancer Neg Hx   . Arrhythmia Mother    Social History:  reports that he quit smoking about 31 years ago. His smoking use included Cigarettes. He has never used smokeless tobacco. He reports that he does not drink alcohol or use illicit drugs.  Allergies:  Allergies  Allergen Reactions  . Alprazolam     Intolerant- abnormal dreams  . Doxycycline     GI upset  . Hydrocodone Other (See Comments)    intolerant  . Ibuprofen     REACTION: intolerant, h/o GI upset/GERD  . Lipitor [Atorvastatin]     "it made me feel old"  . Zolpidem Tartrate     Terrible dreams      OUTPATIENT MEDICATIONS: No current facility-administered medications on file prior to encounter.   Current Outpatient Prescriptions on File Prior to Encounter  Medication Sig Dispense Refill  . amLODipine (NORVASC) 10 MG tablet Take 1 tablet (10 mg total) by mouth daily. (Patient taking differently: Take 10 mg by mouth daily after supper. ) 90 tablet 3  .  amoxicillin (AMOXIL) 500 MG capsule Take 200 mg by mouth once. For dental appointment    . celecoxib (CELEBREX) 200 MG capsule Take 1 capsule (200 mg total) by mouth daily. 90 capsule 3  . rOPINIRole (REQUIP) 0.5 MG tablet Take 1 tablet (0.5 mg total) by mouth at bedtime. (Patient taking differently: Take 0.5 mg by mouth at bedtime as needed (Restless leg). ) 90 tablet 3  . aspirin 81 MG tablet Take 81 mg by mouth daily.      . methocarbamol (ROBAXIN) 500 MG tablet Take 1 tablet (500 mg total) by mouth every 6 (six) hours as needed. (Patient not taking: Reported on 04/25/2015) 60 tablet 3  . Multiple Vitamin (MULTIVITAMIN) tablet Take 1 tablet by mouth daily.      No results found for this or any previous visit (from the past 48 hour(s)). Dg Pelvis Portable  05/02/2015  CLINICAL DATA:  Postop left hip replacement. EXAM: PORTABLE PELVIS 1-2 VIEWS COMPARISON:  09/20/2008 FINDINGS: Right hip arthroplasty  unchanged. Evidence of patient's recent left total hip arthroplasty with prosthetic components intact and normally located. Surgical drain over the superior lateral soft tissues of the left hip. Remainder of the exam is within normal. IMPRESSION: Evidence of recent left total hip arthroplasty without complicating features. Electronically Signed   By: Marin Olp M.D.   On: 05/02/2015 10:50   Dg C-arm 1-60 Min-no Report  05/02/2015  CLINICAL DATA: surgery C-ARM 1-60 MINUTES Fluoroscopy was utilized by the requesting physician.  No radiographic interpretation.    ROS: General:no colds or fevers, no weight changes Skin:no rashes or ulcers HEENT:no blurred vision, no congestion CV:see HPI PUL:see HPI GI:no diarrhea constipation or melena, no indigestion GU:no hematuria, no dysuria MS:+ hip pain, no claudication Neuro:no syncope, no lightheadedness Endo:no diabetes, no thyroid disease LDL was 105 in sept Tchol 169 TG 99, HDL 44    Blood pressure 125/78, pulse 68, temperature 98.3 F (36.8 C), temperature source Oral, resp. rate 14, SpO2 97 %.  Wt Readings from Last 3 Encounters:  04/25/15 159 lb 9.6 oz (72.394 kg)  04/03/15 163 lb 4 oz (74.05 kg)  01/03/15 162 lb 12 oz (73.823 kg)    PE: General:Pleasant affect, NAD Skin:Warm and dry, brisk capillary refill HEENT:normocephalic, sclera clear, mucus membranes moist Neck:supple, no JVD, no bruits  Heart:S1S2 RRR without murmur, gallup, rub or click Lungs:clear, ant. without rales, rhonchi, or wheezes [id number], non tender, + BS, do not palpate liver spleen or masses Ext:no lower ext edema, 2+ pedal pulses, 2+ radial pulses  Neuro:alert and oriented X 3, MAE, follows commands, + facial symmetry Tele:  SR   Assessment/Plan Principal Problem:   OA (osteoarthritis) of hip Active Problems:   Essential hypertension   S/P total hip arthroplasty   Chest pain at rest  1. Chest pain somewhat atypical, EKG without changes, SB resolved with  NTG will check troponins.  If negative will plan out pt. Nuclear study. Dr. Marlou Porch to see.    2. HTN BP controlled.  [place] Practitioner Certified [place] Pager [phone] or after 5pm or weekends call [phone] 05/02/2015, 1:56 PM  Personally seen and examined. Agree with above. Age as risk factor. Non smoker, non diabetic. If troponin normal OK with outpatient NUC stress test. May have been MSK pain. He states that occasionally will feel this at home and take flexeril which helps. RRR, no rub, ECG ok.   Candee Furbish, MD

## 2015-05-02 NOTE — Progress Notes (Signed)
O.K. For patient to go on to Telemetry Floor and see Cardiology  There- per Dr. Annye Asa

## 2015-05-02 NOTE — Progress Notes (Addendum)
X-ray results noted 

## 2015-05-03 ENCOUNTER — Other Ambulatory Visit: Payer: Self-pay | Admitting: Cardiology

## 2015-05-03 DIAGNOSIS — R079 Chest pain, unspecified: Secondary | ICD-10-CM

## 2015-05-03 LAB — BASIC METABOLIC PANEL
Anion gap: 7 (ref 5–15)
BUN: 10 mg/dL (ref 6–20)
CALCIUM: 8.5 mg/dL — AB (ref 8.9–10.3)
CO2: 27 mmol/L (ref 22–32)
CREATININE: 0.56 mg/dL — AB (ref 0.61–1.24)
Chloride: 107 mmol/L (ref 101–111)
GFR calc Af Amer: >60 mL/min (ref >60)
GLUCOSE: 124 mg/dL — AB (ref 65–99)
Potassium: 3.5 mmol/L (ref 3.5–5.1)
Sodium: 141 mmol/L (ref 135–145)

## 2015-05-03 LAB — CBC
HCT: 34.6 % — ABNORMAL LOW (ref 39.0–52.0)
Hemoglobin: 11.3 g/dL — ABNORMAL LOW (ref 13.0–17.0)
MCH: 28.3 pg (ref 26.0–34.0)
MCHC: 32.7 g/dL (ref 30.0–36.0)
MCV: 86.5 fL (ref 78.0–100.0)
PLATELETS: 306 10*3/uL (ref 150–400)
RBC: 4 MIL/uL — ABNORMAL LOW (ref 4.22–5.81)
RDW: 13 % (ref 11.5–15.5)
WBC: 15.7 10*3/uL — AB (ref 4.0–10.5)

## 2015-05-03 LAB — TROPONIN I: Troponin I: <0.03 ng/mL (ref <0.031)

## 2015-05-03 MED ORDER — RIVAROXABAN 10 MG PO TABS: 10.0000 mg | ORAL_TABLET | Freq: Every day | ORAL | Status: DC

## 2015-05-03 MED ORDER — TRAMADOL HCL 50 MG PO TABS: 50.0000 mg | ORAL_TABLET | Freq: Four times a day (QID) | ORAL | Status: DC | PRN

## 2015-05-03 MED ORDER — METHOCARBAMOL 500 MG PO TABS: 500.0000 mg | ORAL_TABLET | Freq: Four times a day (QID) | ORAL | Status: DC | PRN

## 2015-05-03 MED ORDER — OXYCODONE HCL 5 MG PO TABS: 5.0000 mg | ORAL_TABLET | ORAL | Status: DC | PRN

## 2015-05-03 MED ORDER — NON FORMULARY: 20.0000 mg | Freq: Every morning | Status: DC

## 2015-05-03 MED ORDER — OMEPRAZOLE 20 MG PO CPDR
20.0000 mg | DELAYED_RELEASE_CAPSULE | Freq: Every day | ORAL | Status: DC
  Administered 2015-05-03: 20 mg via ORAL
  Filled 2015-05-03: qty 1

## 2015-05-03 NOTE — Discharge Summary (Signed)
Physician Discharge Summary   Patient ID: [name] MRN: [id number] DOB/AGE: [birth year] 70 y.o.  Admit date: 05/02/2015 Discharge date: 05/03/2015  Primary Diagnosis:  Osteoarthritis of the Left hip.  Admission Diagnoses:  Past Medical History  Diagnosis Date  . Syncope     negative cardiac w/u per Dr Irish Lack  . OA (osteoarthritis)     with r hip replacement  . Nephrolithiasis   . Tick bite 2011    likely tick associated illness, treated with doxy with resolution,  . GERD (gastroesophageal reflux disease)   . Viral exanthem, unspecified   . Osteoarthrosis involving, or with mention of more than one site, but not specified as generalized, site unspecified(715.80)   . History of BPH   . Hyperlipidemia   . Hypertension   . Adjustment reaction with physical symptoms   . Elevated PSA     h/o with normal bx per URO  . Cancer, skin, squamous cell     R shoulder, removed per derm 2012   Discharge Diagnoses:   Principal Problem:   OA (osteoarthritis) of hip Active Problems:   Essential hypertension   S/P total hip arthroplasty   Chest pain at rest  Estimated body mass index is 24.85 kg/(m^2) as calculated from the following:   Height as of this encounter: _0  (1.702 m).   Weight as of this encounter: 72 kg (158 lb 11.7 oz).  Procedure(s) (LRB): TOTAL LEFT HIP ARTHROPLASTY ANTERIOR APPROACH (Left)   Consults: cardiology, Dr. Candee Furbish  HPI: [name] is a 70 y.o. male who has advanced end-  stage arthritis of his Left hip with progressively worsening pain and  dysfunction.The patient has failed nonoperative management and presents for  total hip arthroplasty.   Laboratory Data: Admission on 05/02/2015, Discharged on 05/03/2015  Component Date Value Ref Range Status  . Troponin I 05/02/2015 <0.03  <0.031 ng/mL Final   Comment:        NO INDICATION OF MYOCARDIAL INJURY.   . Troponin I 05/02/2015 <0.03  <0.031 ng/mL Final   Comment:        NO INDICATION  OF MYOCARDIAL INJURY.   . Troponin I 05/03/2015 <0.03  <0.031 ng/mL Final   Comment:        NO INDICATION OF MYOCARDIAL INJURY.   . Sodium 05/02/2015 143  135 - 145 mmol/L Final  . Potassium 05/02/2015 3.9  3.5 - 5.1 mmol/L Final  . Chloride 05/02/2015 105  101 - 111 mmol/L Final  . CO2 05/02/2015 30  22 - 32 mmol/L Final  . Glucose, Bld 05/02/2015 134* 65 - 99 mg/dL Final  . BUN 05/02/2015 7  6 - 20 mg/dL Final  . Creatinine, Ser 05/02/2015 0.65  0.61 - 1.24 mg/dL Final  . Calcium 05/02/2015 8.8* 8.9 - 10.3 mg/dL Final  . GFR calc non Af Amer 05/02/2015 >60  >60 mL/min Final  . GFR calc Af Amer 05/02/2015 >60  >60 mL/min Final   Comment: (NOTE) The eGFR has been calculated using the CKD EPI equation. This calculation has not been validated in all clinical situations. eGFR's persistently <60 mL/min signify possible Chronic Kidney Disease.   . Anion gap 05/02/2015 8  5 - 15 Final  . WBC 05/02/2015 13.7* 4.0 - 10.5 K/uL Final  . RBC 05/02/2015 4.24  4.22 - 5.81 MIL/uL Final  . Hemoglobin 05/02/2015 12.2* 13.0 - 17.0 g/dL Final  . HCT 05/02/2015 37.5* 39.0 - 52.0 % Final  . MCV 05/02/2015 88.4  78.0 - 100.0 fL Final  . MCH 05/02/2015 28.8  26.0 - 34.0 pg Final  . MCHC 05/02/2015 32.5  30.0 - 36.0 g/dL Final  . RDW 05/02/2015 13.2  11.5 - 15.5 % Final  . Platelets 05/02/2015 318  150 - 400 K/uL Final  . WBC 05/03/2015 15.7* 4.0 - 10.5 K/uL Final  . RBC 05/03/2015 4.00* 4.22 - 5.81 MIL/uL Final  . Hemoglobin 05/03/2015 11.3* 13.0 - 17.0 g/dL Final  . HCT 05/03/2015 34.6* 39.0 - 52.0 % Final  . MCV 05/03/2015 86.5  78.0 - 100.0 fL Final  . MCH 05/03/2015 28.3  26.0 - 34.0 pg Final  . MCHC 05/03/2015 32.7  30.0 - 36.0 g/dL Final  . RDW 05/03/2015 13.0  11.5 - 15.5 % Final  . Platelets 05/03/2015 306  150 - 400 K/uL Final  . Sodium 05/03/2015 141  135 - 145 mmol/L Final  . Potassium 05/03/2015 3.5  3.5 - 5.1 mmol/L Final  . Chloride 05/03/2015 107  101 - 111 mmol/L Final  . CO2  05/03/2015 27  22 - 32 mmol/L Final  . Glucose, Bld 05/03/2015 124* 65 - 99 mg/dL Final  . BUN 05/03/2015 10  6 - 20 mg/dL Final  . Creatinine, Ser 05/03/2015 0.56* 0.61 - 1.24 mg/dL Final  . Calcium 05/03/2015 8.5* 8.9 - 10.3 mg/dL Final  . GFR calc non Af Amer 05/03/2015 >60  >60 mL/min Final  . GFR calc Af Amer 05/03/2015 >60  >60 mL/min Final   Comment: (NOTE) The eGFR has been calculated using the CKD EPI equation. This calculation has not been validated in all clinical situations. eGFR's persistently <60 mL/min signify possible Chronic Kidney Disease.   Georgiann Hahn gap 05/03/2015 7  5 - 15 Final  Hospital Outpatient Visit on 04/25/2015  Component Date Value Ref Range Status  . MRSA, PCR 04/25/2015 NEGATIVE  NEGATIVE Final  . Staphylococcus aureus 04/25/2015 POSITIVE* NEGATIVE Final   Comment:        The Xpert SA Assay (FDA approved for NASAL specimens in patients over 13 years of age), is one component of a comprehensive surveillance program.  Test performance has been validated by [facility] for patients greater than or equal to 58 year old. It is not intended to diagnose infection nor to guide or monitor treatment.   Marland Kitchen aPTT 04/25/2015 29  24 - 37 seconds Final  . WBC 04/25/2015 5.7  4.0 - 10.5 K/uL Final  . RBC 04/25/2015 4.63  4.22 - 5.81 MIL/uL Final  . Hemoglobin 04/25/2015 13.1  13.0 - 17.0 g/dL Final  . HCT 04/25/2015 40.3  39.0 - 52.0 % Final  . MCV 04/25/2015 87.0  78.0 - 100.0 fL Final  . MCH 04/25/2015 28.3  26.0 - 34.0 pg Final  . MCHC 04/25/2015 32.5  30.0 - 36.0 g/dL Final  . RDW 04/25/2015 13.5  11.5 - 15.5 % Final  . Platelets 04/25/2015 357  150 - 400 K/uL Final  . Sodium 04/25/2015 136  135 - 145 mmol/L Final  . Potassium 04/25/2015 3.7  3.5 - 5.1 mmol/L Final  . Chloride 04/25/2015 102  101 - 111 mmol/L Final  . CO2 04/25/2015 25  22 - 32 mmol/L Final  . Glucose, Bld 04/25/2015 81  65 - 99 mg/dL Final  . BUN 04/25/2015 13  6 - 20 mg/dL Final  .  Creatinine, Ser 04/25/2015 0.67  0.61 - 1.24 mg/dL Final  . Calcium 04/25/2015 9.0  8.9 - 10.3 mg/dL Final  .  Total Protein 04/25/2015 7.0  6.5 - 8.1 g/dL Final  . Albumin 04/25/2015 4.1  3.5 - 5.0 g/dL Final  . AST 04/25/2015 22  15 - 41 U/L Final  . ALT 04/25/2015 14* 17 - 63 U/L Final  . Alkaline Phosphatase 04/25/2015 72  38 - 126 U/L Final  . Total Bilirubin 04/25/2015 0.6  0.3 - 1.2 mg/dL Final  . GFR calc non Af Amer 04/25/2015 >60  >60 mL/min Final  . GFR calc Af Amer 04/25/2015 >60  >60 mL/min Final   Comment: (NOTE) The eGFR has been calculated using the CKD EPI equation. This calculation has not been validated in all clinical situations. eGFR's persistently <60 mL/min signify possible Chronic Kidney Disease.   . Anion gap 04/25/2015 9  5 - 15 Final  . Prothrombin Time 04/25/2015 14.4  11.6 - 15.2 seconds Final  . INR 04/25/2015 1.14  0.00 - 1.49 Final  . ABO/RH(D) 04/25/2015 A POS   Final  . Antibody Screen 04/25/2015 NEG   Final  . Sample Expiration 04/25/2015 05/05/2015   Final  . Extend sample reason 04/25/2015 NO TRANSFUSIONS OR PREGNANCY IN THE PAST 3 MONTHS   Final  . Color, Urine 04/25/2015 YELLOW  YELLOW Final  . APPearance 04/25/2015 CLEAR  CLEAR Final  . Specific Gravity, Urine 04/25/2015 1.007  1.005 - 1.030 Final  . pH 04/25/2015 7.0  5.0 - 8.0 Final  . Glucose, UA 04/25/2015 NEGATIVE  NEGATIVE mg/dL Final  . Hgb urine dipstick 04/25/2015 NEGATIVE  NEGATIVE Final  . Bilirubin Urine 04/25/2015 NEGATIVE  NEGATIVE Final  . Ketones, ur 04/25/2015 NEGATIVE  NEGATIVE mg/dL Final  . Protein, ur 04/25/2015 NEGATIVE  NEGATIVE mg/dL Final  . Nitrite 04/25/2015 NEGATIVE  NEGATIVE Final  . Leukocytes, UA 04/25/2015 NEGATIVE  NEGATIVE Final   MICROSCOPIC NOT DONE ON URINES WITH NEGATIVE PROTEIN, BLOOD, LEUKOCYTES, NITRITE, OR GLUCOSE <1000 mg/dL.     X-Rays:Dg Pelvis Portable  05/02/2015  CLINICAL DATA:  Postop left hip replacement. EXAM: PORTABLE PELVIS 1-2 VIEWS  COMPARISON:  09/20/2008 FINDINGS: Right hip arthroplasty unchanged. Evidence of patient's recent left total hip arthroplasty with prosthetic components intact and normally located. Surgical drain over the superior lateral soft tissues of the left hip. Remainder of the exam is within normal. IMPRESSION: Evidence of recent left total hip arthroplasty without complicating features. Electronically Signed   By: Marin Olp M.D.   On: 05/02/2015 10:50   Dg C-arm 1-60 Min-no Report  05/02/2015  CLINICAL DATA: surgery C-ARM 1-60 MINUTES Fluoroscopy was utilized by the requesting physician.  No radiographic interpretation.    EKG: Orders placed or performed during the hospital encounter of 05/02/15  . EKG 12-Lead  . EKG 12-Lead     Hospital Course: Patient was admitted to [facility] and taken to the OR and underwent the above state procedure without complications.  Patient tolerated the procedure well and was later transferred to the recovery room. In the PACU he developed Lt ant chest pain that then radiated laterally then back and came up head. He was given 1 SL NTG and it resolved. Also given protonix and does have hx of GERD. EKG without acute changes. He was then to the telemetry floor for postoperative care.  They were given PO and IV analgesics for pain control following their surgery.  They were given 24 hours of postoperative antibiotics of  Anti-infectives    Start     Dose/Rate Route Frequency Ordered Stop   05/02/15 1500  ceFAZolin (  ANCEF) IVPB 2 g/50 mL premix     2 g 100 mL/hr over 30 Minutes Intravenous Every 6 hours 05/02/15 1406 05/02/15 2058   05/02/15 0808  ceFAZolin (ANCEF) IVPB 2 g/50 mL premix     2 g 100 mL/hr over 30 Minutes Intravenous On call to O.R. 05/02/15 [id number] 05/02/15 0829     and started on DVT prophylaxis in the form of Xarelto.   PT and OT were ordered for total hip protocol.  The patient was allowed to be WBAT with therapy. Discharge planning was  consulted to help with postop disposition and equipment needs.  Patient had a good night on the evening of surgery with no further complaints.  They started to get up OOB with therapy on day one.  Hemovac drain was pulled without difficulty.  Greatly appreciated Cardiology consult and assistance.DC Tele that morning. No further complaints at that time from cardiac standpoint. Sinus rhythm by monitor with rate in the 90's. Already up OOB that morning voiding without difficulty.  Probable outpatient cardiac workup (Troponin I negative so far times two). Patient was seen in rounds by Cards also and was ready to go home.  Felt better, no CP.  Will check NUC stress as outpatient following rehab/PT for hip.  Discharge home with home health Diet - Cardiac diet Follow up - in 2 weeks on Tuesday 3.14.2017. Activity - WBAT to the left leg Disposition - Home Condition Upon Discharge - improving D/C Meds - See DC Summary DVT Prophylaxis - Xarelto       Discharge Instructions    Call MD / Call 911    Complete by:  As directed   If you experience chest pain or shortness of breath, CALL 911 and be transported to the hospital emergency room.  If you develope a fever above 101 F, pus (white drainage) or increased drainage or redness at the wound, or calf pain, call your surgeon's office.     Change dressing    Complete by:  As directed   You may change your dressing dressing daily with sterile 4 x 4 inch gauze dressing and paper tape.  Do not submerge the incision under water.     Constipation Prevention    Complete by:  As directed   Drink plenty of fluids.  Prune juice may be helpful.  You may use a stool softener, such as Colace (over the counter) 100 mg twice a day.  Use MiraLax (over the counter) for constipation as needed.     Diet - low sodium heart healthy    Complete by:  As directed      Discharge instructions    Complete by:  As directed   Pick up stool softner and laxative for home use  following surgery while on pain medications. Do not submerge incision under water. May remove the surgical dressing tomorrow, Friday 05/04/2015, and then apply a dry gauze dressing daily. Please use good hand washing techniques while changing dressing each day. May shower starting three days after surgery starting Saturday 05/05/2015. Please use a clean towel to pat the incision dry following showers. Continue to use ice for pain and swelling after surgery. Do not use any lotions or creams on the incision until instructed by your surgeon.  Postoperative Constipation Protocol  Constipation - defined medically as fewer than three stools per week and severe constipation as less than one stool per week.  One of the most common issues patients have following surgery is constipation. Even  if you have a regular bowel pattern at home, your normal regimen is likely to be disrupted due to multiple reasons following surgery. Combination of anesthesia, postoperative narcotics, change in appetite and fluid intake all can affect your bowels. In order to avoid complications following surgery, here are some recommendations in order to help you during your recovery period.  Colace (docusate) - Pick up an over-the-counter form of Colace or another stool softener and take twice a day as long as you are requiring postoperative pain medications. Take with a full glass of water daily. If you experience loose stools or diarrhea, hold the colace until you stool forms back up. If your symptoms do not get better within 1 week or if they get worse, check with your doctor.  Dulcolax (bisacodyl) - Pick up over-the-counter and take as directed by the product packaging as needed to assist with the movement of your bowels. Take with a full glass of water. Use this product as needed if not relieved by Colace only.   MiraLax (polyethylene glycol) - Pick up over-the-counter to have on hand. MiraLax is a solution that will  increase the amount of water in your bowels to assist with bowel movements. Take as directed and can mix with a glass of water, juice, soda, coffee, or tea. Take if you go more than two days without a movement. Do not use MiraLax more than once per day. Call your doctor if you are still constipated or irregular after using this medication for 7 days in a row.  If you continue to have problems with postoperative constipation, please contact the office for further assistance and recommendations. If you experience "the worst abdominal pain ever" or develop nausea or vomiting, please contact the office immediatly for further recommendations for treatment.  Take Xarelto for two and a half more weeks, then discontinue Xarelto. Once the patient has completed the Xarelto, they may resume the 81 mg Aspirin.     Do not sit on low chairs, stoools or toilet seats, as it may be difficult to get up from low surfaces    Complete by:  As directed      Driving restrictions    Complete by:  As directed   No driving until released by the physician.     Increase activity slowly as tolerated    Complete by:  As directed      Lifting restrictions    Complete by:  As directed   No lifting until released by the physician.     Patient may shower    Complete by:  As directed   You may shower without a dressing once there is no drainage.  Do not wash over the wound.  If drainage remains, do not shower until drainage stops.     TED hose    Complete by:  As directed   Use stockings (TED hose) for 3 weeks on both leg(s).  You may remove them at night for sleeping.     Weight bearing as tolerated    Complete by:  As directed   Laterality:  left  Extremity:  Lower            Medication List    STOP taking these medications        amoxicillin 500 MG capsule  Commonly known as:  AMOXIL     amoxicillin-clavulanate 875-125 MG tablet  Commonly known as:  AUGMENTIN     aspirin 81 MG tablet     celecoxib 200  MG capsule  Commonly known as:  CELEBREX     multivitamin tablet      TAKE these medications        amLODipine 10 MG tablet  Commonly known as:  NORVASC  Take 1 tablet (10 mg total) by mouth daily.     bacitracin-polymyxin b ointment  Commonly known as:  POLYSPORIN  Apply 1 application topically 2 (two) times daily as needed (Wound care).     diazepam 2 MG tablet  Commonly known as:  VALIUM  Take 2 tablets (4 mg total) by mouth at bedtime as needed (insomnia).     GAS-X 80 MG chewable tablet  Generic drug:  simethicone  Chew 80 mg by mouth every 6 (six) hours as needed for flatulence.     methocarbamol 500 MG tablet  Commonly known as:  ROBAXIN  Take 1 tablet (500 mg total) by mouth every 6 (six) hours as needed for muscle spasms.     omeprazole 20 MG capsule  Commonly known as:  PRILOSEC  Take 1 capsule by mouth  daily     oxyCODONE 5 MG immediate release tablet  Commonly known as:  Oxy IR/ROXICODONE  Take 1-2 tablets (5-10 mg total) by mouth every 3 (three) hours as needed for moderate pain or severe pain.     rivaroxaban 10 MG Tabs tablet  Commonly known as:  XARELTO  Take 1 tablet (10 mg total) by mouth daily with breakfast. Take Xarelto for two and a half more weeks, then discontinue Xarelto. Once the patient has completed the Xarelto, they may resume the 81 mg Aspirin.     rOPINIRole 0.5 MG tablet  Commonly known as:  REQUIP  Take 1 tablet (0.5 mg total) by mouth at bedtime.     traMADol 50 MG tablet  Commonly known as:  ULTRAM  Take 1-2 tablets (50-100 mg total) by mouth every 6 (six) hours as needed (mild to moderate pain).       Follow-up Information    Follow up with Gearlean Alf, MD. Schedule an appointment as soon as possible for a visit on 05/15/2015.   Specialty:  Orthopedic Surgery   Why:  Call office at [phone] to setup appointment on Tuesday 05/15/2015 with Dr. Wynelle Link.   Contact information:   [address] [place]  [address] [phone]       Follow up with Candee Furbish, MD On 06/06/2015.   Specialty:  Cardiology   Why:  at 10:15 AM   Contact information:   [address] [place] [address] [phone]       Follow up with [facility] On 05/24/2015.   Specialty:  Cardiology   Why:  at 10:00AM for stress test   Contact information:   [address], [place]      Signed: Arlee Muslim, PA-C Orthopaedic Surgery 05/16/2015, 9:01 PM

## 2015-05-03 NOTE — Progress Notes (Signed)
[facility]  Delivered rw to room   [name] 05/03/2015, 2:13 PM

## 2015-05-03 NOTE — Evaluation (Signed)
Physical Therapy Evaluation Patient Details Name: [name] MRN: [id number] DOB: [birth year] Today's Date: 05/03/2015   History of Present Illness  s/p L DA THA  Clinical Impression  Pt is s/p THA resulting in the deficits listed below (see PT Problem List).  Pt will benefit from skilled PT to increase their independence and safety with mobility to allow discharge to the venue listed below.      Follow Up Recommendations Home health PT    Equipment Recommendations  Rolling walker with 5" wheels    Recommendations for Other Services       Precautions / Restrictions Restrictions Weight Bearing Restrictions: No Other Position/Activity Restrictions: WBAT      Mobility  Bed Mobility Overal bed mobility: Needs Assistance                Transfers Overall transfer level: Needs assistance Equipment used: Rolling walker (2 wheeled);None Transfers: Sit to/from Stand Sit to Stand: Supervision         General transfer comment: for safety  Ambulation/Gait Ambulation/Gait assistance: Supervision Ambulation Distance (Feet): 160 Feet Assistive device: Rolling walker (2 wheeled) Gait Pattern/deviations: Step-through pattern     General Gait Details: cues for RW safety  Stairs            Wheelchair Mobility    Modified Rankin (Stroke Patients Only)       Balance Overall balance assessment: Needs assistance           Standing balance-Leahy Scale: Fair Standing balance comment: able to tol min perturbations                             Pertinent Vitals/Pain Pain Assessment: 0-10 Pain Score: 4  Pain Location: left hip Pain Intervention(s): Limited activity within patient's tolerance;Monitored during session;Premedicated before session;Repositioned    Home Living Family/patient expects to be discharged to:: Private residence Living Arrangements: Spouse/significant other   Type of Home: House Home Access: Stairs to enter   Engineer, site of Steps: 1 Home Layout: One level Home Equipment: Environmental consultant - 2 wheels      Prior Function Level of Independence: Independent               Hand Dominance        Extremity/Trunk Assessment   Upper Extremity Assessment: Overall WFL for tasks assessed           Lower Extremity Assessment: LLE deficits/detail   LLE Deficits / Details: hip  grossly 3 to 3+/5; knee 3+/5, ankle WFL     Communication   Communication: No difficulties  Cognition Arousal/Alertness: Awake/alert Behavior During Therapy: WFL for tasks assessed/performed Overall Cognitive Status: Within Functional Limits for tasks assessed                      General Comments      Exercises        Assessment/Plan    PT Assessment Patient needs continued PT services  PT Diagnosis Difficulty walking   PT Problem List Decreased strength;Decreased range of motion;Decreased mobility;Decreased balance;Decreased activity tolerance;Decreased knowledge of use of DME  PT Treatment Interventions DME instruction;Gait training;Functional mobility training;Therapeutic activities;Patient/family education;Therapeutic exercise   PT Goals (Current goals can be found in the Care Plan section) Acute Rehab PT Goals Patient Stated Goal: home today PT Goal Formulation: With patient Time For Goal Achievement: 05/10/15 Potential to Achieve Goals: Good    Frequency 7X/week   Barriers to  discharge        Co-evaluation               End of Session   Activity Tolerance: Patient tolerated treatment well Patient left: in chair;with chair alarm set;with family/visitor present;with call bell/phone within reach           Time: 1110-1135 PT Time Calculation (min) (ACUTE ONLY): 25 min   Charges:   PT Evaluation $PT Eval Low Complexity: 1 Procedure PT Treatments $Gait Training: 8-22 mins   PT G Codes:        [name] 2015/05/28, 12:49 PM

## 2015-05-03 NOTE — Progress Notes (Signed)
   05/03/15 1256  PT Visit Information  Last PT Received On 05/03/15  Assistance Needed +1  History of Present Illness s/p L DA THA  PT Time Calculation  PT Start Time (ACUTE ONLY) 1200  PT Stop Time (ACUTE ONLY) 1222  PT Time Calculation (min) (ACUTE ONLY) 22 min  Subjective Data  Patient Stated Goal home today  Precautions  Precautions Fall  Precaution Comments cautioned pt to slow down, use RW, not over do it for the first week  Restrictions  Weight Bearing Restrictions No  Other Position/Activity Restrictions WBAT  Pain Assessment  Pain Assessment 0-10  Pain Score 1  Pain Location left hip  Pain Descriptors / Indicators Burning  Pain Intervention(s) Limited activity within patient's tolerance;Monitored during session  Cognition  Arousal/Alertness Awake/alert  Behavior During Therapy [facility] for tasks assessed/performed  Overall Cognitive Status Within Functional Limits for tasks assessed  Transfers  Overall transfer level Modified independent  Ambulation/Gait  Ambulation/Gait assistance Supervision;Modified independent (Device/Increase time)  Ambulation Distance (Feet) 200 Feet  Assistive device Rolling walker (2 wheeled)  Gait Pattern/deviations Step-through pattern  General Gait Details cues for RW safety  Total Joint Exercises  Ankle Circles/Pumps AROM;Strengthening;10 reps  Quad Sets Strengthening;Both;10 reps  Heel Slides AROM;Strengthening;Left;10 reps  Hip ABduction/ADduction AROM;Strengthening;Left;10 reps  Knee Flexion AROM;Left;10 reps;Standing  Marching in Standing AROM;Strengthening;Left;10 reps;Standing  Standing Hip Extension Strengthening;Left;10 reps;Standing  PT - End of Session  Activity Tolerance Patient tolerated treatment well  Patient left in chair;with call bell/phone within reach;with family/visitor present;with chair alarm set  Nurse Communication Mobility status  PT - Assessment/Plan  PT Plan Current plan remains appropriate  PT Frequency  (ACUTE ONLY) 7X/week  Follow Up Recommendations Home health PT  PT equipment Rolling walker with 5" wheels  PT Goal Progression  Progress towards PT goals Progressing toward goals  Acute Rehab PT Goals  PT Goal Formulation With patient  Time For Goal Achievement 05/10/15  Potential to Achieve Goals Good  PT General Charges  $$ ACUTE PT VISIT 1 Procedure  PT Treatments  $Therapeutic Exercise

## 2015-05-03 NOTE — Discharge Instructions (Addendum)
° °Dr. Frank Aluisio °Total Joint Specialist °[facility] °[address] °, [place] [address] °[phone] ° °ANTERIOR APPROACH TOTAL HIP REPLACEMENT POSTOPERATIVE DIRECTIONS ° ° °Hip Rehabilitation, Guidelines Following Surgery  °The results of a hip operation are greatly improved after range of motion and muscle strengthening exercises. Follow all safety measures which are given to protect your hip. If any of these exercises cause increased pain or swelling in your joint, decrease the amount until you are comfortable again. Then slowly increase the exercises. Call your caregiver if you have problems or questions.  ° °HOME CARE INSTRUCTIONS  °Remove items at home which could result in a fall. This includes throw rugs or furniture in walking pathways.  °· ICE to the affected hip every three hours for 30 minutes at a time and then as needed for pain and swelling.  Continue to use ice on the hip for pain and swelling from surgery. You may notice swelling that will progress down to the foot and ankle.  This is normal after surgery.  Elevate the leg when you are not up walking on it.   °· Continue to use the breathing machine which will help keep your temperature down.  It is common for your temperature to cycle up and down following surgery, especially at night when you are not up moving around and exerting yourself.  The breathing machine keeps your lungs expanded and your temperature down. ° ° °DIET °You may resume your previous home diet once your are discharged from the hospital. ° °DRESSING / WOUND CARE / SHOWERING °You may shower 3 days after surgery, but keep the wounds dry during showering.  You may use an occlusive plastic wrap (Press'n Seal for example), NO SOAKING/SUBMERGING IN THE BATHTUB.  If the bandage gets wet, change with a clean dry gauze.  If the incision gets wet, pat the wound dry with a clean towel. °You may start showering once you are discharged home but do not  submerge the incision under water. Just pat the incision dry and apply a dry gauze dressing on daily. °Change the surgical dressing daily and reapply a dry dressing each time. ° °ACTIVITY °Walk with your walker as instructed. °Use walker as long as suggested by your caregivers. °Avoid periods of inactivity such as sitting longer than an hour when not asleep. This helps prevent blood clots.  °You may resume a sexual relationship in one month or when given the OK by your doctor.  °You may return to work once you are cleared by your doctor.  °Do not drive a car for 6 weeks or until released by you surgeon.  °Do not drive while taking narcotics. ° °WEIGHT BEARING °Weight bearing as tolerated with assist device (walker, cane, etc) as directed, use it as long as suggested by your surgeon or therapist, typically at least 4-6 weeks. ° °POSTOPERATIVE CONSTIPATION PROTOCOL °Constipation - defined medically as fewer than three stools per week and severe constipation as less than one stool per week. ° °One of the most common issues patients have following surgery is constipation.  Even if you have a regular bowel pattern at home, your normal regimen is likely to be disrupted due to multiple reasons following surgery.  Combination of anesthesia, postoperative narcotics, change in appetite and fluid intake all can affect your bowels.  In order to avoid complications following surgery, here are some recommendations in order to help you during your recovery period. ° °Colace (docusate) - Pick up an over-the-counter   form of Colace or another stool softener and take twice a day as long as you are requiring postoperative pain medications.  Take with a full glass of water daily.  If you experience loose stools or diarrhea, hold the colace until you stool forms back up.  If your symptoms do not get better within 1 week or if they get worse, check with your doctor. ° °Dulcolax (bisacodyl) - Pick up over-the-counter and take as directed  by the product packaging as needed to assist with the movement of your bowels.  Take with a full glass of water.  Use this product as needed if not relieved by Colace only.  ° °MiraLax (polyethylene glycol) - Pick up over-the-counter to have on hand.  MiraLax is a solution that will increase the amount of water in your bowels to assist with bowel movements.  Take as directed and can mix with a glass of water, juice, soda, coffee, or tea.  Take if you go more than two days without a movement. °Do not use MiraLax more than once per day. Call your doctor if you are still constipated or irregular after using this medication for 7 days in a row. ° °If you continue to have problems with postoperative constipation, please contact the office for further assistance and recommendations.  If you experience "the worst abdominal pain ever" or develop nausea or vomiting, please contact the office immediatly for further recommendations for treatment. ° °ITCHING ° If you experience itching with your medications, try taking only a single pain pill, or even half a pain pill at a time.  You can also use Benadryl over the counter for itching or also to help with sleep.  ° °TED HOSE STOCKINGS °Wear the elastic stockings on both legs for three weeks following surgery during the day but you may remove then at night for sleeping. ° °MEDICATIONS °See your medication summary on the “After Visit Summary” that the nursing staff will review with you prior to discharge.  You may have some home medications which will be placed on hold until you complete the course of blood thinner medication.  It is important for you to complete the blood thinner medication as prescribed by your surgeon.  Continue your approved medications as instructed at time of discharge. ° °PRECAUTIONS °If you experience chest pain or shortness of breath - call 911 immediately for transfer to the hospital emergency department.  °If you develop a fever greater that 101 F,  purulent drainage from wound, increased redness or drainage from wound, foul odor from the wound/dressing, or calf pain - CONTACT YOUR SURGEON.   °                                                °FOLLOW-UP APPOINTMENTS °Make sure you keep all of your appointments after your operation with your surgeon and caregivers. You should call the office at the above phone number and make an appointment for approximately two weeks after the date of your surgery or on the date instructed by your surgeon outlined in the "After Visit Summary". ° °RANGE OF MOTION AND STRENGTHENING EXERCISES  °These exercises are designed to help you keep full movement of your hip joint. Follow your caregiver's or physical therapist's instructions. Perform all exercises about fifteen times, three times per day or as directed. Exercise both hips, even if you   have had only one joint replacement. These exercises can be done on a training (exercise) mat, on the floor, on a table or on a bed. Use whatever works the best and is most comfortable for you. Use music or television while you are exercising so that the exercises are a pleasant break in your day. This will make your life better with the exercises acting as a break in routine you can look forward to.  Lying on your back, slowly slide your foot toward your buttocks, raising your knee up off the floor. Then slowly slide your foot back down until your leg is straight again.  Lying on your back spread your legs as far apart as you can without causing discomfort.  Lying on your side, raise your upper leg and foot straight up from the floor as far as is comfortable. Slowly lower the leg and repeat.  Lying on your back, tighten up the muscle in the front of your thigh (quadriceps muscles). You can do this by keeping your leg straight and trying to raise your heel off the floor. This helps strengthen the largest muscle supporting your knee.  Lying on your back, tighten up the muscles of your  buttocks both with the legs straight and with the knee bent at a comfortable angle while keeping your heel on the floor.   IF YOU ARE TRANSFERRED TO A SKILLED REHAB FACILITY If the patient is transferred to a skilled rehab facility following release from the hospital, a list of the current medications will be sent to the facility for the patient to continue.  When discharged from the skilled rehab facility, please have the facility set up the patient's [place] prior to being released. Also, the skilled facility will be responsible for providing the patient with their medications at time of release from the facility to include their pain medication, the muscle relaxants, and their blood thinner medication. If the patient is still at the rehab facility at time of the two week follow up appointment, the skilled rehab facility will also need to assist the patient in arranging follow up appointment in our office and any transportation needs.  MAKE SURE YOU:  Understand these instructions.  Get help right away if you are not doing well or get worse.    Pick up stool softner and laxative for home use following surgery while on pain medications. Do not submerge incision under water. Please use good hand washing techniques while changing dressing each day. May shower starting three days after surgery. Please use a clean towel to pat the incision dry following showers. Continue to use ice for pain and swelling after surgery. Do not use any lotions or creams on the incision until instructed by your surgeon.  Take Xarelto for two and a half more weeks, then discontinue Xarelto. Once the patient has completed the Xarelto, they may resume the 81 mg Aspirin.   Information on my medicine - XARELTO (Rivaroxaban)  This medication education was reviewed with me or my healthcare representative as part of my discharge preparation.  The pharmacist that spoke with me during my hospital stay  was:  Altha Harm  Why was Xarelto prescribed for you? Xarelto was prescribed for you to reduce the risk of blood clots forming after orthopedic surgery. The medical term for these abnormal blood clots is venous thromboembolism (VTE).  What do you need to know about xarelto ? Take your Xarelto ONCE DAILY at the same time every day. You may  take it either with or without food.  If you have difficulty swallowing the tablet whole, you may crush it and mix in applesauce just prior to taking your dose.  Take Xarelto exactly as prescribed by your doctor and DO NOT stop taking Xarelto without talking to the doctor who prescribed the medication.  Stopping without other VTE prevention medication to take the place of Xarelto may increase your risk of developing a clot.  After discharge, you should have regular check-up appointments with your healthcare provider that is prescribing your Xarelto.    What do you do if you miss a dose? If you miss a dose, take it as soon as you remember on the same day then continue your regularly scheduled once daily regimen the next day. Do not take two doses of Xarelto on the same day.   Important Safety Information A possible side effect of Xarelto is bleeding. You should call your healthcare provider right away if you experience any of the following: ? Bleeding from an injury or your nose that does not stop. ? Unusual colored urine (red or dark brown) or unusual colored stools (red or black). ? Unusual bruising for unknown reasons. ? A serious fall or if you hit your head (even if there is no bleeding).  Some medicines may interact with Xarelto and might increase your risk of bleeding while on Xarelto. To help avoid this, consult your healthcare provider or pharmacist prior to using any new prescription or non-prescription medications, including herbals, vitamins, non-steroidal anti-inflammatory drugs (NSAIDs) and supplements.  This website has more  information on Xarelto: [email or web address]

## 2015-05-03 NOTE — Progress Notes (Signed)
Subjective: No further chest pain, no SOB,  Did not rest due to being in hospital.   Objective: Vital signs in last 24 hours: Temp:  [98 F (36.7 C)-98.3 F (36.8 C)] 98 F (36.7 C) (03/02 0514) Pulse Rate:  [62-79] 74 (03/02 0514) Resp:  [12-16] 16 (03/02 0514) BP: (108-142)/(62-84) 114/62 mmHg (03/02 0514) SpO2:  [96 %-99 %] 97 % (03/02 0514) Weight:  [158 lb 11.7 oz (72 kg)] 158 lb 11.7 oz (72 kg) (03/01 1400) Weight change:  Last BM Date: 05/01/15 (per patient. ) Intake/Output from previous day: 03/01 0701 - 03/02 0700 In: 4588.3 [P.O.:480; I.V.:4053.3; IV Piggyback:55] Out: [id number] [Urine:4400; Drains:175; Blood:300] Intake/Output this shift: Total I/O In: -  Out: 1000 [Urine:1000]  PE: General:Pleasant affect, NAD Skin:Warm and dry, brisk capillary refill Heart:S1S2 RRR without murmur, gallup, rub or click Lungs:clear without rales, rhonchi, or wheezes [id number], non tender, + BS, do not palpate liver spleen or masses Neuro:alert and oriented, MAE, follows commands, + facial symmetry Tele:  SR  Lab Results:  Recent Labs  05/02/15 1424 05/03/15 0457  WBC 13.7* 15.7*  HGB 12.2* 11.3*  HCT 37.5* 34.6*  PLT 318 306   BMET  Recent Labs  05/02/15 1424 05/03/15 0457  NA 143 141  K 3.9 3.5  CL 105 107  CO2 30 27  GLUCOSE 134* 124*  BUN 7 10  CREATININE 0.65 0.56*  CALCIUM 8.8* 8.5*    Recent Labs  05/02/15 2012 05/03/15 0233  TROPONINI <0.03 <0.03    Lab Results  Component Value Date   CHOL 169 11/07/2014   HDL 44.30 11/07/2014   LDLCALC 105* 11/07/2014   LDLDIRECT 135.6 09/10/2011   TRIG 99.0 11/07/2014   CHOLHDL 4 11/07/2014      Studies/Results: Dg Pelvis Portable  05/02/2015  CLINICAL DATA:  Postop left hip replacement. EXAM: PORTABLE PELVIS 1-2 VIEWS COMPARISON:  09/20/2008 FINDINGS: Right hip arthroplasty unchanged. Evidence of patient's recent left total hip arthroplasty with prosthetic components intact and normally  located. Surgical drain over the superior lateral soft tissues of the left hip. Remainder of the exam is within normal. IMPRESSION: Evidence of recent left total hip arthroplasty without complicating features. Electronically Signed   By: Marin Olp M.D.   On: 05/02/2015 10:50   Dg C-arm 1-60 Min-no Report  05/02/2015  CLINICAL DATA: surgery C-ARM 1-60 MINUTES Fluoroscopy was utilized by the requesting physician.  No radiographic interpretation.    Medications: I have reviewed the patient's current medications. Scheduled Meds: . amLODipine  10 mg Oral QPC supper  . docusate sodium  100 mg Oral BID  . omeprazole  20 mg Oral Daily  . rivaroxaban  10 mg Oral Q breakfast   Continuous Infusions: . sodium chloride 10 mL/hr at 05/03/15 0857   PRN Meds:.acetaminophen **OR** acetaminophen, bisacodyl, diazepam, diphenhydrAMINE, menthol-cetylpyridinium **OR** phenol, methocarbamol **OR** methocarbamol (ROBAXIN)  IV, metoCLOPramide **OR** metoCLOPramide (REGLAN) injection, morphine injection, ondansetron **OR** ondansetron (ZOFRAN) IV, oxyCODONE, polyethylene glycol, rOPINIRole, sodium phosphate, traMADol  Assessment/Plan: Principal Problem:   OA (osteoarthritis) of hip Active Problems:   Essential hypertension   S/P total hip arthroplasty   Chest pain at rest  1. Chest pain somewhat atypical, EKG without changes, SB --resolved with NTG negative  troponins.Will plan out pt. Nuclear study. Dr. Marlou Porch to see. may have been muscular skeletal pain.  2. HTN BP controlled.  LOS: 1 day   Time spent with pt. :10 minutes. [facility] R  Nurse Practitioner  Certified Pager [phone] or after 5pm and on weekends call [phone] 05/03/2015, 12:18 PM   Personally seen and examined. Agree with above. Feels better, no CP Will check NUC stress as outpatient following rehab/PT for hip. OK with DC from cardiac perspective. Troponin normal.  Candee Furbish, MD

## 2015-05-03 NOTE — Progress Notes (Signed)
Subjective: 1 Day Post-Op Procedure(s) (LRB): TOTAL LEFT HIP ARTHROPLASTY ANTERIOR APPROACH (Left) Patient reports pain as mild.   Patient seen in rounds with Dr. Wynelle Link.  Did not get any sleep.  Wants to get home today. Will work with therapy and get two sessions and then home later today as long as he does well, meets his goals, and all labs from cardiac standpoint continue to look good. Patient is well, but has had some minor complaints of pain in the hip, requiring pain medications and a headache.  Encouraged the use of the oral pain medications. Patient is ready to go home later today if improved.  Greatly appreciate Cardiology consult and assistance.  Will DC Tele this morning.  No further complaints at this time from cardiac standpoint.  Sinus rhythm by monitor with rate in the 90's.  Already up OOB this morning voiding without difficulty. Probable outpatient cardiac workup (Troponin I negative so far times two).  Objective: Vital signs in last 24 hours: Temp:  [97.4 F (36.3 C)-98.3 F (36.8 C)] 98 F (36.7 C) (03/02 0514) Pulse Rate:  [50-79] 74 (03/02 0514) Resp:  [11-17] 16 (03/02 0514) BP: (90-142)/(61-85) 114/62 mmHg (03/02 0514) SpO2:  [95 %-100 %] 97 % (03/02 0514) Weight:  [72 kg (158 lb 11.7 oz)] 72 kg (158 lb 11.7 oz) (03/01 1400)  Intake/Output from previous day:  Intake/Output Summary (Last 24 hours) at 05/03/15 0759 Last data filed at 05/03/15 0705  Gross per 24 hour  Intake 4588.34 ml  Output   5175 ml  Net -586.66 ml    Intake/Output this shift: Total I/O In: -  Out: 300 [Urine:300]  Labs:  Recent Labs  05/02/15 1424 05/03/15 0457  HGB 12.2* 11.3*    Recent Labs  05/02/15 1424 05/03/15 0457  WBC 13.7* 15.7*  RBC 4.24 4.00*  HCT 37.5* 34.6*  PLT 318 306    Recent Labs  05/02/15 1424 05/03/15 0457  NA 143 141  K 3.9 3.5  CL 105 107  CO2 30 27  BUN 7 10  CREATININE 0.65 0.56*  GLUCOSE 134* 124*  CALCIUM 8.8* 8.5*   No  results for input(s): LABPT, INR in the last 72 hours.  EXAM: General - Patient is Alert, Appropriate and Oriented Extremity - Neurovascular intact Sensation intact distally Dressing - clean, dry, no drainage Motor Function - intact, moving foot and toes well on exam.  Hemovac removed without difficulty.  Assessment/Plan: 1 Day Post-Op Procedure(s) (LRB): TOTAL LEFT HIP ARTHROPLASTY ANTERIOR APPROACH (Left) Procedure(s) (LRB): TOTAL LEFT HIP ARTHROPLASTY ANTERIOR APPROACH (Left) Past Medical History  Diagnosis Date  . Syncope     negative cardiac w/u per Dr Irish Lack  . OA (osteoarthritis)     with r hip replacement  . Nephrolithiasis   . Tick bite 2011    likely tick associated illness, treated with doxy with resolution,  . GERD (gastroesophageal reflux disease)   . Viral exanthem, unspecified   . Osteoarthrosis involving, or with mention of more than one site, but not specified as generalized, site unspecified(715.80)   . History of BPH   . Hyperlipidemia   . Hypertension   . Adjustment reaction with physical symptoms   . Elevated PSA     h/o with normal bx per URO  . Cancer, skin, squamous cell     R shoulder, removed per derm 2012   Principal Problem:   OA (osteoarthritis) of hip Active Problems:   Essential hypertension   S/P total hip  arthroplasty   Chest pain at rest  Estimated body mass index is 24.85 kg/(m^2) as calculated from the following:   Height as of this encounter: 5\' 7"  (1.702 m).   Weight as of this encounter: 72 kg (158 lb 11.7 oz). Up with therapy Discharge home with home health Diet - Cardiac diet Follow up - in 2 weeks on Tuesday 3.14.2017. Activity - WBAT to the left leg Disposition - Home Condition Upon Discharge - improving D/C Meds - See DC Summary DVT Prophylaxis - Xarelto   HGB 11.3 this morning. Troponin I - <0.03 twice so far  Arlee Muslim, PA-C Orthopaedic Surgery 05/03/2015, 7:59 AM

## 2015-05-03 NOTE — Progress Notes (Signed)
OT Cancellation Note  Patient Details Name: KOLTEN PAPPA MRN: [id number] DOB: 11/27/1945   Cancelled Treatment:    Reason Eval/Treat Not Completed: PT screened, no needs identified, will sign off  Alisia Vanengen 05/03/2015, 1:43 PM  Lesle Chris, OTR/L [id number] 05/03/2015

## 2015-05-03 NOTE — Progress Notes (Signed)
Pt discharging home with [facility] for [facility]. Referral given to in house rep.

## 2015-05-22 ENCOUNTER — Telehealth (HOSPITAL_COMMUNITY): Payer: Self-pay | Admitting: *Deleted

## 2015-05-22 NOTE — Telephone Encounter (Signed)
Left message on voicemail in reference to upcoming appointment scheduled for 05/24/15. Phone number given for a call back so details instructions can be given. [name], Ranae Palms

## 2015-05-23 ENCOUNTER — Telehealth (HOSPITAL_COMMUNITY): Payer: Self-pay | Admitting: *Deleted

## 2015-05-23 NOTE — Telephone Encounter (Signed)
Left message on voicemail in reference to upcoming appointment scheduled for 05/24/15. Phone number given for a call back so details instructions can be given. Hubbard Robinson, RN

## 2015-05-24 ENCOUNTER — Encounter (HOSPITAL_COMMUNITY): Payer: Medicare Other

## 2015-06-06 ENCOUNTER — Ambulatory Visit: Payer: Medicare Other | Admitting: Cardiology

## 2015-06-11 ENCOUNTER — Other Ambulatory Visit: Payer: Self-pay | Admitting: *Deleted

## 2015-06-11 NOTE — Telephone Encounter (Signed)
Faxed refill request. Last Filled:    180 tablet 1 01/18/2015  Last office visit:   04/03/2015   Please advise.

## 2015-06-12 MED ORDER — DIAZEPAM 2 MG PO TABS: 4.0000 mg | ORAL_TABLET | Freq: Every evening | ORAL | Status: DC | PRN

## 2015-06-12 NOTE — Telephone Encounter (Signed)
Please call in.  Thanks.   

## 2015-06-12 NOTE — Telephone Encounter (Signed)
Rx called to pharmacy as instructed. 

## 2015-06-14 ENCOUNTER — Other Ambulatory Visit: Payer: Self-pay | Admitting: *Deleted

## 2015-06-14 NOTE — Telephone Encounter (Addendum)
Left detailed message on voicemail.  I was able to contact the patient and he made a mistake.  He has a full bottle in his medicine cabinet so he asks that we please disregard the request.

## 2015-06-14 NOTE — Telephone Encounter (Signed)
Not printed yet- please check with patient.  Did he get this prev filled locally?  If so, then we should fill with mail order when his current supply is nearly exhausted.  Thanks.

## 2015-06-14 NOTE — Telephone Encounter (Signed)
Faxed refill request. Last Filled:   To local pharmacy 180 tablet 1 06/12/2015  This request is to Mail Order.  (3 months supply)

## 2015-06-17 ENCOUNTER — Encounter (HOSPITAL_COMMUNITY): Payer: Self-pay | Admitting: Emergency Medicine

## 2015-06-17 ENCOUNTER — Emergency Department (HOSPITAL_COMMUNITY): Payer: Medicare Other

## 2015-06-17 ENCOUNTER — Emergency Department (HOSPITAL_COMMUNITY)
Admission: EM | Admit: 2015-06-17 | Discharge: 2015-06-17 | Disposition: A | Discharge status: Home / Self Care | Payer: Medicare Other | Attending: Emergency Medicine | Admitting: Emergency Medicine

## 2015-06-17 DIAGNOSIS — Z8639 Personal history of other endocrine, nutritional and metabolic disease: Secondary | ICD-10-CM | POA: Diagnosis not present

## 2015-06-17 DIAGNOSIS — Z791 Long term (current) use of non-steroidal anti-inflammatories (NSAID): Secondary | ICD-10-CM | POA: Diagnosis not present

## 2015-06-17 DIAGNOSIS — M1990 Unspecified osteoarthritis, unspecified site: Secondary | ICD-10-CM | POA: Diagnosis not present

## 2015-06-17 DIAGNOSIS — Z8659 Personal history of other mental and behavioral disorders: Secondary | ICD-10-CM | POA: Diagnosis not present

## 2015-06-17 DIAGNOSIS — N400 Enlarged prostate without lower urinary tract symptoms: Secondary | ICD-10-CM | POA: Insufficient documentation

## 2015-06-17 DIAGNOSIS — N200 Calculus of kidney: Secondary | ICD-10-CM | POA: Insufficient documentation

## 2015-06-17 DIAGNOSIS — I10 Essential (primary) hypertension: Secondary | ICD-10-CM | POA: Diagnosis not present

## 2015-06-17 DIAGNOSIS — R1032 Left lower quadrant pain: Secondary | ICD-10-CM | POA: Diagnosis present

## 2015-06-17 DIAGNOSIS — Z85828 Personal history of other malignant neoplasm of skin: Secondary | ICD-10-CM | POA: Insufficient documentation

## 2015-06-17 DIAGNOSIS — Z87891 Personal history of nicotine dependence: Secondary | ICD-10-CM | POA: Insufficient documentation

## 2015-06-17 DIAGNOSIS — K219 Gastro-esophageal reflux disease without esophagitis: Secondary | ICD-10-CM | POA: Insufficient documentation

## 2015-06-17 DIAGNOSIS — Z79899 Other long term (current) drug therapy: Secondary | ICD-10-CM | POA: Insufficient documentation

## 2015-06-17 DIAGNOSIS — Z7982 Long term (current) use of aspirin: Secondary | ICD-10-CM | POA: Insufficient documentation

## 2015-06-17 LAB — URINALYSIS, ROUTINE W REFLEX MICROSCOPIC
BILIRUBIN URINE: NEGATIVE
Glucose, UA: NEGATIVE mg/dL
Hgb urine dipstick: NEGATIVE
KETONES UR: NEGATIVE mg/dL
LEUKOCYTES UA: NEGATIVE
NITRITE: NEGATIVE
PH: 7.5 (ref 5.0–8.0)
Protein, ur: NEGATIVE mg/dL
SPECIFIC GRAVITY, URINE: 1.008 (ref 1.005–1.030)

## 2015-06-17 LAB — COMPREHENSIVE METABOLIC PANEL
ALK PHOS: 94 U/L (ref 38–126)
ALT: 17 U/L (ref 17–63)
AST: 24 U/L (ref 15–41)
Albumin: 4.1 g/dL (ref 3.5–5.0)
Anion gap: 9 (ref 5–15)
BUN: 10 mg/dL (ref 6–20)
CHLORIDE: 101 mmol/L (ref 101–111)
CO2: 26 mmol/L (ref 22–32)
CREATININE: 0.75 mg/dL (ref 0.61–1.24)
Calcium: 8.6 mg/dL — ABNORMAL LOW (ref 8.9–10.3)
GFR calc non Af Amer: >60 mL/min (ref >60)
Glucose, Bld: 123 mg/dL — ABNORMAL HIGH (ref 65–99)
Potassium: 3.3 mmol/L — ABNORMAL LOW (ref 3.5–5.1)
SODIUM: 136 mmol/L (ref 135–145)
Total Bilirubin: 0.2 mg/dL — ABNORMAL LOW (ref 0.3–1.2)
Total Protein: 7.3 g/dL (ref 6.5–8.1)

## 2015-06-17 LAB — CBC
HCT: 36.6 % — ABNORMAL LOW (ref 39.0–52.0)
HEMOGLOBIN: 12.1 g/dL — AB (ref 13.0–17.0)
MCH: 27.1 pg (ref 26.0–34.0)
MCHC: 33.1 g/dL (ref 30.0–36.0)
MCV: 81.9 fL (ref 78.0–100.0)
PLATELETS: 346 10*3/uL (ref 150–400)
RBC: 4.47 MIL/uL (ref 4.22–5.81)
RDW: 13.4 % (ref 11.5–15.5)
WBC: 11 10*3/uL — AB (ref 4.0–10.5)

## 2015-06-17 MED ORDER — OXYCODONE-ACETAMINOPHEN 5-325 MG PO TABS
1.0000 | ORAL_TABLET | Freq: Once | ORAL | Status: AC
Start: 2015-06-17 — End: 2015-06-17
  Administered 2015-06-17: 1 via ORAL
  Filled 2015-06-17: qty 1

## 2015-06-17 MED ORDER — MORPHINE SULFATE (PF) 4 MG/ML IV SOLN
4.0000 mg | Freq: Once | INTRAVENOUS | Status: AC
  Administered 2015-06-17: 4 mg via INTRAVENOUS
  Filled 2015-06-17: qty 1

## 2015-06-17 MED ORDER — HYDROMORPHONE HCL 1 MG/ML IJ SOLN
1.0000 mg | Freq: Once | INTRAMUSCULAR | Status: AC
  Administered 2015-06-17: 1 mg via INTRAVENOUS
  Filled 2015-06-17: qty 1

## 2015-06-17 MED ORDER — SODIUM CHLORIDE 0.9 % IV BOLUS (SEPSIS)
1000.0000 mL | Freq: Once | INTRAVENOUS | Status: AC
  Administered 2015-06-17: 1000 mL via INTRAVENOUS

## 2015-06-17 MED ORDER — ONDANSETRON HCL 4 MG/2ML IJ SOLN
4.0000 mg | Freq: Once | INTRAMUSCULAR | Status: AC
  Administered 2015-06-17: 4 mg via INTRAVENOUS
  Filled 2015-06-17: qty 2

## 2015-06-17 MED ORDER — ONDANSETRON 4 MG PO TBDP: 4.0000 mg | ORAL_TABLET | Freq: Three times a day (TID) | ORAL | Status: DC | PRN

## 2015-06-17 MED ORDER — KETOROLAC TROMETHAMINE 30 MG/ML IJ SOLN
30.0000 mg | Freq: Once | INTRAMUSCULAR | Status: AC
  Administered 2015-06-17: 30 mg via INTRAVENOUS
  Filled 2015-06-17: qty 1

## 2015-06-17 MED ORDER — TAMSULOSIN HCL 0.4 MG PO CAPS: 0.4000 mg | ORAL_CAPSULE | Freq: Every day | ORAL | Status: DC

## 2015-06-17 MED ORDER — OXYCODONE-ACETAMINOPHEN 5-325 MG PO TABS: 1.0000 | ORAL_TABLET | Freq: Four times a day (QID) | ORAL | Status: DC | PRN

## 2015-06-17 NOTE — Discharge Instructions (Signed)
Kidney Stones °Kidney stones (urolithiasis) are deposits that form inside your kidneys. The intense pain is caused by the stone moving through the urinary tract. When the stone moves, the ureter goes into spasm around the stone. The stone is usually passed in the urine.  °CAUSES  °· A disorder that makes certain neck glands produce too much parathyroid hormone (primary hyperparathyroidism). °· A buildup of uric acid crystals, similar to gout in your joints. °· Narrowing (stricture) of the ureter. °· A kidney obstruction present at birth (congenital obstruction). °· Previous surgery on the kidney or ureters. °· Numerous kidney infections. °SYMPTOMS  °· Feeling sick to your stomach (nauseous). °· Throwing up (vomiting). °· Blood in the urine (hematuria). °· Pain that usually spreads (radiates) to the groin. °· Frequency or urgency of urination. °DIAGNOSIS  °· Taking a history and physical exam. °· Blood or urine tests. °· CT scan. °· Occasionally, an examination of the inside of the urinary bladder (cystoscopy) is performed. °TREATMENT  °· Observation. °· Increasing your fluid intake. °· Extracorporeal shock wave lithotripsy--This is a noninvasive procedure that uses shock waves to break up kidney stones. °· Surgery may be needed if you have severe pain or persistent obstruction. There are various surgical procedures. Most of the procedures are performed with the use of small instruments. Only small incisions are needed to accommodate these instruments, so recovery time is minimized. °The size, location, and chemical composition are all important variables that will determine the proper choice of action for you. Talk to your health care provider to better understand your situation so that you will minimize the risk of injury to yourself and your kidney.  °HOME CARE INSTRUCTIONS  °· Drink enough water and fluids to keep your urine clear or pale yellow. This will help you to pass the stone or stone fragments. °· Strain  all urine through the provided strainer. Keep all particulate matter and stones for your health care provider to see. The stone causing the pain may be as small as a grain of salt. It is very important to use the strainer each and every time you pass your urine. The collection of your stone will allow your health care provider to analyze it and verify that a stone has actually passed. The stone analysis will often identify what you can do to reduce the incidence of recurrences. °· Only take over-the-counter or prescription medicines for pain, discomfort, or fever as directed by your health care provider. °· Keep all follow-up visits as told by your health care provider. This is important. °· Get follow-up X-rays if required. The absence of pain does not always mean that the stone has passed. It may have only stopped moving. If the urine remains completely obstructed, it can cause loss of kidney function or even complete destruction of the kidney. It is your responsibility to make sure X-rays and follow-ups are completed. Ultrasounds of the kidney can show blockages and the status of the kidney. Ultrasounds are not associated with any radiation and can be performed easily in a matter of minutes. °· Make changes to your daily diet as told by your health care provider. You may be told to: °¨ Limit the amount of salt that you eat. °¨ Eat 5 or more servings of fruits and vegetables each day. °¨ Limit the amount of meat, poultry, fish, and eggs that you eat. °· Collect a 24-hour urine sample as told by your health care provider. You may need to collect another urine sample every 6-12   months. °SEEK MEDICAL CARE IF: °· You experience pain that is progressive and unresponsive to any pain medicine you have been prescribed. °SEEK IMMEDIATE MEDICAL CARE IF:  °· Pain cannot be controlled with the prescribed medicine. °· You have a fever or shaking chills. °· The severity or intensity of pain increases over 18 hours and is not  relieved by pain medicine. °· You develop a new onset of abdominal pain. °· You feel faint or pass out. °· You are unable to urinate. °  °This information is not intended to replace advice given to you by your health care provider. Make sure you discuss any questions you have with your health care provider. °  °Document Released: 02/17/2005 Document Revised: 11/08/2014 Document Reviewed: 07/21/2012 °Elsevier Interactive Patient Education ©2016 [employer] ° °

## 2015-06-17 NOTE — ED Provider Notes (Signed)
CSN: [id number]     Arrival date & time 06/17/15  0415 History   First MD Initiated Contact with Patient 06/17/15 0434     Chief Complaint  Patient presents with  . Flank Pain     (Consider location/radiation/quality/duration/timing/severity/associated sxs/prior Treatment) HPI This is a 70 year old male with history of kidney stones who presents with left lower quadrant pain. Patient reports he can feel something in his back over the last several days. However over the last 2 hours he's had increasing left lower quadrant pain that is sharp. He also reports back pain. He took hydrocodone prior to arrival without any relief. He denies any hematuria or dysuria. He does have a history of kidney stones. Current pain is 6 out of 10. Denies fevers. Denies diarrhea. Does endorse one episode of nonbilious, nonbloody emesis. Denies chest pain or shortness of breath.\   Past Medical History  Diagnosis Date  . Syncope     negative cardiac w/u per Dr Irish Lack  . OA (osteoarthritis)     with r hip replacement  . Nephrolithiasis   . Tick bite 2011    likely tick associated illness, treated with doxy with resolution,  . GERD (gastroesophageal reflux disease)   . Viral exanthem, unspecified   . Osteoarthrosis involving, or with mention of more than one site, but not specified as generalized, site unspecified(715.80)   . History of BPH   . Hyperlipidemia   . Hypertension   . Adjustment reaction with physical symptoms   . Elevated PSA     h/o with normal bx per URO  . Cancer, skin, squamous cell     R shoulder, removed per derm 2012   Past Surgical History  Procedure Laterality Date  . Total hip arthroplasty      right  . Achilles tendon repair  1984  . Shoulder arthroscopy  2012    R rotator cuff (biceps tear not repaired)  . Joint replacement    . Skin cancer excision      right shoulder  . Total hip arthroplasty Left 05/02/2015    Procedure: TOTAL LEFT HIP ARTHROPLASTY ANTERIOR APPROACH;   Surgeon: Gaynelle Arabian, MD;  Location: [facility];  Service: Orthopedics;  Laterality: Left;   Family History  Problem Relation Age of Onset  . Prostate cancer Maternal Uncle   . Colon cancer Neg Hx   . Arrhythmia Mother    Social History  Substance Use Topics  . Smoking status: Former Smoker    Types: Cigarettes    Quit date: 04/12/1984  . Smokeless tobacco: Never Used  . Alcohol Use: No    Review of Systems  Constitutional: Negative for fever.  Respiratory: Negative for shortness of breath.   Cardiovascular: Negative for chest pain.  Gastrointestinal: Positive for nausea and vomiting. Negative for diarrhea.  Genitourinary: Positive for flank pain. Negative for dysuria and hematuria.  All other systems reviewed and are negative.     Allergies  Alprazolam; Doxycycline; Hydrocodone; Ibuprofen; Lipitor; and Zolpidem tartrate  Home Medications   Prior to Admission medications   Medication Sig Start Date End Date Taking? Authorizing Provider  amLODipine (NORVASC) 10 MG tablet Take 1 tablet (10 mg total) by mouth daily. Patient taking differently: Take 10 mg by mouth daily after supper.  11/16/14 12/30/15 Yes Tonia Ghent, MD  aspirin EC 81 MG tablet Take 81 mg by mouth daily.   Yes Historical Provider, MD  bacitracin-polymyxin b (POLYSPORIN) ointment Apply 1 application topically 2 (two) times daily as  needed (Wound care).   Yes Historical Provider, MD  celecoxib (CELEBREX) 200 MG capsule Take 200 mg by mouth daily. 03/27/15  Yes Historical Provider, MD  diazepam (VALIUM) 2 MG tablet Take 2 tablets (4 mg total) by mouth at bedtime as needed (insomnia). 06/12/15  Yes Tonia Ghent, MD  omeprazole (PRILOSEC) 20 MG capsule Take 1 capsule by mouth  daily 04/24/15  Yes Tonia Ghent, MD  oxyCODONE (OXY IR/ROXICODONE) 5 MG immediate release tablet Take 1-2 tablets (5-10 mg total) by mouth every 3 (three) hours as needed for moderate pain or severe pain. 05/03/15  Yes Arlee Muslim, PA-C   rOPINIRole (REQUIP) 0.5 MG tablet Take 1 tablet (0.5 mg total) by mouth at bedtime. Patient taking differently: Take 0.5 mg by mouth at bedtime as needed (Restless leg).  11/16/14  Yes Tonia Ghent, MD  simethicone (GAS-X) 80 MG chewable tablet Chew 80 mg by mouth every 6 (six) hours as needed for flatulence.   Yes Historical Provider, MD  traMADol (ULTRAM) 50 MG tablet Take 1-2 tablets (50-100 mg total) by mouth every 6 (six) hours as needed (mild to moderate pain). 05/03/15  Yes Arlee Muslim, PA-C  methocarbamol (ROBAXIN) 500 MG tablet Take 1 tablet (500 mg total) by mouth every 6 (six) hours as needed for muscle spasms. Patient not taking: Reported on 06/17/2015 05/03/15   Arlee Muslim, PA-C  ondansetron (ZOFRAN ODT) 4 MG disintegrating tablet Take 1 tablet (4 mg total) by mouth every 8 (eight) hours as needed for nausea or vomiting. 06/17/15   Merryl Hacker, MD  oxyCODONE-acetaminophen (PERCOCET/ROXICET) 5-325 MG tablet Take 1 tablet by mouth every 6 (six) hours as needed for severe pain. 06/17/15   Merryl Hacker, MD  rivaroxaban (XARELTO) 10 MG TABS tablet Take 1 tablet (10 mg total) by mouth daily with breakfast. Take Xarelto for two and a half more weeks, then discontinue Xarelto. Once the patient has completed the Xarelto, they may resume the 81 mg Aspirin. Patient not taking: Reported on 06/17/2015 05/03/15   Arlee Muslim, PA-C  tamsulosin (FLOMAX) 0.4 MG CAPS capsule Take 1 capsule (0.4 mg total) by mouth daily. 06/17/15   Merryl Hacker, MD   BP 133/85 mmHg  Pulse 74  Temp(Src) 98.4 F (36.9 C)  Resp 18  Ht 5\' 7"  (1.702 m)  Wt 165 lb (74.844 kg)  BMI 25.84 kg/m2  SpO2 96% Physical Exam  Constitutional: He is oriented to person, place, and time.  Uncomfortable appearing, appears younger than stated age  HENT:  Head: Normocephalic and atraumatic.  Eyes: Pupils are equal, round, and reactive to light.  Cardiovascular: Normal rate, regular rhythm and normal heart sounds.   No  murmur heard. Pulmonary/Chest: Effort normal and breath sounds normal. No respiratory distress. He has no wheezes.  Abdominal: Soft. Bowel sounds are normal. There is no tenderness. There is no rebound and no guarding.  Mass or hernia noted  Genitourinary:  No CVA tenderness  Musculoskeletal: He exhibits no edema.  Neurological: He is alert and oriented to person, place, and time.  Skin: Skin is warm and dry.  Psychiatric: He has a normal mood and affect.  Nursing note and vitals reviewed.   ED Course  Procedures (including critical care time) Labs Review Labs Reviewed  CBC - Abnormal; Notable for the following:    WBC 11.0 (*)    Hemoglobin 12.1 (*)    HCT 36.6 (*)    All other components within normal limits  COMPREHENSIVE  METABOLIC PANEL - Abnormal; Notable for the following:    Potassium 3.3 (*)    Glucose, Bld 123 (*)    Calcium 8.6 (*)    Total Bilirubin 0.2 (*)    All other components within normal limits  URINALYSIS, ROUTINE W REFLEX MICROSCOPIC (NOT AT [facility])    Imaging Review Ct Renal Stone Study  06/17/2015  CLINICAL DATA:  Left flank pain, duration 1 week.  Worsened tonight. EXAM: CT ABDOMEN AND PELVIS WITHOUT CONTRAST TECHNIQUE: Multidetector CT imaging of the abdomen and pelvis was performed following the standard protocol without IV contrast. COMPARISON:  10/04/2006 FINDINGS: There is acute obstructive uropathy on the left with marked hydronephrosis, hydroureter and periureteral stranding. The left ureteral obstruction extends deep into the pelvis, with an 2-3 cm of the ureterovesical junction. At that point, there is significant metal artifact from the bilateral hip arthroplasty hardware, but there is a 4 mm calcification which probably represents the distal left ureteral calculus. There was no similar sized calcification in the area on the 2008 CT, before placement of the hip prostheses. No other acute findings are evident in the abdomen or pelvis. There is a 16 mm  low-attenuation focus in the left hepatic lobe which is probably a benign cyst. Biliary system is unremarkable. There are unremarkable unenhanced appearances of the pancreas, spleen and adrenals. There is a 1.6 cm low-density focus in the left renal midpole which is probably a benign cyst but it cannot be characterized in the absence of intravenous contrast. There are normal appearances of the stomach, small bowel and appendix. The colon is remarkable only for moderate diverticulosis. The abdominal aorta is normal in caliber. There is mild atherosclerotic calcification. There is no adenopathy in the abdomen or pelvis. There is no significant abnormality in the lower chest. There is no significant musculoskeletal abnormality. IMPRESSION: 1. Acute left ureteral obstruction with marked hydronephrosis, hydroureter and periureteral stranding. The left ureteral obstruction is within 2-3 cm of the UVJ and probably is caused by a 4 mm calculus but there are study limitations due to metal artifact from hip prostheses. 2. Low-attenuation liver and renal abnormalities, more likely benign cysts but not conclusively characterized in the absence of intravenous contrast. 3. Diverticulosis. Electronically Signed   By: Andreas Newport M.D.   On: 06/17/2015 05:31   I have personally reviewed and evaluated these images and lab results as part of my medical decision-making.   EKG Interpretation None      MDM   Final diagnoses:  Kidney stone    Patient presents with LLQ pain and back pain.  Uncomfortable but no reproducible pain on exam.  Patient given fluids, pain and nausea meds.  Labwork without evidence of UTI.  Mild leukocytosis.  NOrmal Creatinine.  Last CT 2008.  REpeat CT today with evidence of 31mm stone with hydro on left and stranding.    7:58 AM Pain improved.  Patient able to tolerate fluids.  GIven toradol and po pain meds.  Expectant management and f/u with Dr. Beatrix Fetters.  After history, exam, and  medical workup I feel the patient has been appropriately medically screened and is safe for discharge home. Pertinent diagnoses were discussed with the patient. Patient was given return precautions.   Merryl Hacker, MD 06/17/15 Vernelle Emerald

## 2015-06-17 NOTE — ED Notes (Signed)
Per EMS pt reports left flank pain that started one week ago with pain into back. Pt reported that pain began getting worse tonight and took 1 hydrocodone at appx 3am.

## 2015-06-17 NOTE — ED Notes (Signed)
Bed: [id number] Expected date:  Expected time:  Means of arrival:  Comments: EMS 74M LLQ, L Flank Pain

## 2015-07-12 ENCOUNTER — Telehealth (HOSPITAL_COMMUNITY): Payer: Self-pay | Admitting: *Deleted

## 2015-07-12 NOTE — Telephone Encounter (Signed)
Left message on voicemail in reference to upcoming appointment scheduled for 07/17/15. Phone number given for a call back so details instructions can be given. Catelyn Friel, Ranae Palms

## 2015-07-13 ENCOUNTER — Telehealth (HOSPITAL_COMMUNITY): Payer: Self-pay | Admitting: Radiology

## 2015-07-13 NOTE — Telephone Encounter (Signed)
Patient given detailed instructions per Myocardial Perfusion Study Information Sheet for the test on 07/17/2015 at 10:00. Patient notified to arrive 15 minutes early and that it is imperative to arrive on time for appointment to keep from having the test rescheduled.  If you need to cancel or reschedule your appointment, please call the office within 24 hours of your appointment. Failure to do so may result in a cancellation of your appointment, and a $50 no show fee. Patient verbalized understanding.EHK

## 2015-07-17 ENCOUNTER — Ambulatory Visit (HOSPITAL_COMMUNITY): Payer: Medicare Other | Attending: Cardiology

## 2015-07-17 DIAGNOSIS — R9439 Abnormal result of other cardiovascular function study: Secondary | ICD-10-CM | POA: Insufficient documentation

## 2015-07-17 DIAGNOSIS — R079 Chest pain, unspecified: Secondary | ICD-10-CM | POA: Insufficient documentation

## 2015-07-17 DIAGNOSIS — I10 Essential (primary) hypertension: Secondary | ICD-10-CM | POA: Insufficient documentation

## 2015-07-17 LAB — MYOCARDIAL PERFUSION IMAGING
CHL CUP NUCLEAR SRS: 8
CHL CUP NUCLEAR SSS: 8
CSEPPHR: 96 {beats}/min
LHR: 0.27
LV dias vol: 101 mL (ref 62–150)
LVSYSVOL: 47 mL
NUC STRESS TID: 0.92
Rest HR: 59 {beats}/min
SDS: 0

## 2015-07-17 MED ORDER — TECHNETIUM TC 99M TETROFOSMIN IV KIT
32.7000 | PACK | Freq: Once | INTRAVENOUS | Status: AC | PRN
  Administered 2015-07-17: 32.7 via INTRAVENOUS
  Filled 2015-07-17: qty 33

## 2015-07-17 MED ORDER — REGADENOSON 0.4 MG/5ML IV SOLN
0.4000 mg | Freq: Once | INTRAVENOUS | Status: AC
  Administered 2015-07-17: 0.4 mg via INTRAVENOUS

## 2015-07-17 MED ORDER — TECHNETIUM TC 99M TETROFOSMIN IV KIT
11.0000 | PACK | Freq: Once | INTRAVENOUS | Status: AC | PRN
  Administered 2015-07-17: 11 via INTRAVENOUS
  Filled 2015-07-17: qty 11

## 2015-07-26 ENCOUNTER — Ambulatory Visit: Payer: Medicare Other | Admitting: Cardiology

## 2015-08-01 ENCOUNTER — Ambulatory Visit (INDEPENDENT_AMBULATORY_CARE_PROVIDER_SITE_OTHER): Payer: Medicare Other | Admitting: Cardiology

## 2015-08-01 ENCOUNTER — Encounter: Payer: Self-pay | Admitting: Cardiology

## 2015-08-01 VITALS — BP 118/80 | HR 90 | Ht 67.0 in | Wt 160.0 lb

## 2015-08-01 DIAGNOSIS — Z01812 Encounter for preprocedural laboratory examination: Secondary | ICD-10-CM

## 2015-08-01 DIAGNOSIS — R9439 Abnormal result of other cardiovascular function study: Secondary | ICD-10-CM

## 2015-08-01 LAB — CBC WITH DIFFERENTIAL/PLATELET
BASOS ABS: 70 {cells}/uL (ref 0–200)
Basophils Relative: 1 %
EOS ABS: 210 {cells}/uL (ref 15–500)
Eosinophils Relative: 3 %
HCT: 36.8 % — ABNORMAL LOW (ref 38.5–50.0)
Hemoglobin: 11.7 g/dL — ABNORMAL LOW (ref 13.2–17.1)
Lymphocytes Relative: 32 %
Lymphs Abs: 2240 cells/uL (ref 850–3900)
MCH: 26.2 pg — AB (ref 27.0–33.0)
MCHC: 31.8 g/dL — AB (ref 32.0–36.0)
MCV: 82.5 fL (ref 80.0–100.0)
MONOS PCT: 7 %
MPV: 9.3 fL (ref 7.5–12.5)
Monocytes Absolute: 490 cells/uL (ref 200–950)
NEUTROS ABS: 3990 {cells}/uL (ref 1500–7800)
NEUTROS PCT: 57 %
PLATELETS: 378 10*3/uL (ref 140–400)
RBC: 4.46 MIL/uL (ref 4.20–5.80)
RDW: 14.3 % (ref 11.0–15.0)
WBC: 7 10*3/uL (ref 3.8–10.8)

## 2015-08-01 LAB — BASIC METABOLIC PANEL
BUN: 9 mg/dL (ref 7–25)
CO2: 25 mmol/L (ref 20–31)
CREATININE: 0.91 mg/dL (ref 0.70–1.18)
Calcium: 9.2 mg/dL (ref 8.6–10.3)
Chloride: 104 mmol/L (ref 98–110)
GLUCOSE: 86 mg/dL (ref 65–99)
Potassium: 4 mmol/L (ref 3.5–5.3)
Sodium: 141 mmol/L (ref 135–146)

## 2015-08-01 LAB — PROTIME-INR
INR: 1 (ref <1.50)
Prothrombin Time: 13.3 seconds (ref 11.6–15.2)

## 2015-08-01 LAB — APTT: aPTT: 31 seconds (ref 24–37)

## 2015-08-01 MED ORDER — AMLODIPINE BESY-BENAZEPRIL HCL 5-10 MG PO CAPS: 1.0000 | ORAL_CAPSULE | Freq: Every day | ORAL | Status: DC

## 2015-08-01 NOTE — Progress Notes (Signed)
Cardiology Office Note    Date:  08/01/2015   ID:  [name] 08/30/1945, MRN [id number]  PCP:  Elsie Stain, MD  Cardiologist:   Candee Furbish, MD     History of Present Illness:  [name] is a 70 y.o. male here for evaluation of abnormal stress test.   Nuclear stress EF: 53%.  There was no ST segment deviation noted during stress.  No T wave inversion was noted during stress.  Defect 1: There is a medium defect of mild severity present in the basal inferoseptal, mid inferoseptal and apical inferior location.  Findings consistent with mild ischemia.  This is an intermediate risk study.  The left ventricular ejection fraction is mildly decreased (45-54%).  This may represent RCA ischemia.  He was in the emergency room previously on 06/17/15 with left lower quadrant pain and back pain, 4 mm stone on CT scan. Fluids administered. Urology follow-up recommended.   Back on 05/03/15 he was at [facility] with somewhat atypical chest pain without EKG changes, troponin was normal. This was surrounding his left total hip arthroplasty by Dr.Aluisio. No prior history of CAD, MI or heart failure. I felt comfortable with discharge at that time. Prior mission trip to [place], coronary concrete blocks without any difficulty, no shortness of breath.  Postoperatively however he did develop some left anterior chest wall pain that radiated laterally. One sublingual nitroglycerin helped resolve the pain. EKG was normal.  He is back now for evaluation of his abnormal stress test.Overall he's been feeling quite well. No significant chest discomfort, shortness of breath, diaphoresis, nausea. He did return to the hospital with severe kidney stone pain. This has resolved.   Past Medical History  Diagnosis Date  . Syncope     negative cardiac w/u per Dr Irish Lack  . OA (osteoarthritis)     with r hip replacement  . Nephrolithiasis   . Tick bite 2011    likely tick associated  illness, treated with doxy with resolution,  . GERD (gastroesophageal reflux disease)   . Viral exanthem, unspecified   . Osteoarthrosis involving, or with mention of more than one site, but not specified as generalized, site unspecified(715.80)   . History of BPH   . Hyperlipidemia   . Hypertension   . Adjustment reaction with physical symptoms   . Elevated PSA     h/o with normal bx per URO  . Cancer, skin, squamous cell     R shoulder, removed per derm 2012    Past Surgical History  Procedure Laterality Date  . Total hip arthroplasty      right  . Achilles tendon repair  1984  . Shoulder arthroscopy  2012    R rotator cuff (biceps tear not repaired)  . Joint replacement    . Skin cancer excision      right shoulder  . Total hip arthroplasty Left 05/02/2015    Procedure: TOTAL LEFT HIP ARTHROPLASTY ANTERIOR APPROACH;  Surgeon: Gaynelle Arabian, MD;  Location: [facility];  Service: Orthopedics;  Laterality: Left;    Current Medications: Outpatient Prescriptions Prior to Visit  Medication Sig Dispense Refill  . aspirin EC 81 MG tablet Take 81 mg by mouth daily.    . bacitracin-polymyxin b (POLYSPORIN) ointment Apply 1 application topically 2 (two) times daily as needed (Wound care).    . celecoxib (CELEBREX) 200 MG capsule Take 200 mg by mouth daily.    . diazepam (VALIUM) 2 MG tablet Take 2  tablets (4 mg total) by mouth at bedtime as needed (insomnia). 180 tablet 1  . methocarbamol (ROBAXIN) 500 MG tablet Take 1 tablet (500 mg total) by mouth every 6 (six) hours as needed for muscle spasms. 90 tablet 0  . omeprazole (PRILOSEC) 20 MG capsule Take 1 capsule by mouth  daily 90 capsule 0  . ondansetron (ZOFRAN ODT) 4 MG disintegrating tablet Take 1 tablet (4 mg total) by mouth every 8 (eight) hours as needed for nausea or vomiting. 20 tablet 0  . oxyCODONE (OXY IR/ROXICODONE) 5 MG immediate release tablet Take 1-2 tablets (5-10 mg total) by mouth every 3 (three) hours as needed for moderate  pain or severe pain. 90 tablet 0  . oxyCODONE-acetaminophen (PERCOCET/ROXICET) 5-325 MG tablet Take 1 tablet by mouth every 6 (six) hours as needed for severe pain. 15 tablet 0  . simethicone (GAS-X) 80 MG chewable tablet Chew 80 mg by mouth every 6 (six) hours as needed for flatulence.    . traMADol (ULTRAM) 50 MG tablet Take 1-2 tablets (50-100 mg total) by mouth every 6 (six) hours as needed (mild to moderate pain). 80 tablet 1  . rOPINIRole (REQUIP) 0.5 MG tablet Take 1 tablet (0.5 mg total) by mouth at bedtime. (Patient taking differently: Take 0.5 mg by mouth at bedtime as needed (Restless leg). ) 90 tablet 3  . amLODipine (NORVASC) 10 MG tablet Take 1 tablet (10 mg total) by mouth daily. (Patient taking differently: Take 10 mg by mouth daily after supper. ) 90 tablet 3  . rivaroxaban (XARELTO) 10 MG TABS tablet Take 1 tablet (10 mg total) by mouth daily with breakfast. Take Xarelto for two and a half more weeks, then discontinue Xarelto. Once the patient has completed the Xarelto, they may resume the 81 mg Aspirin. (Patient not taking: Reported on 06/17/2015) 20 tablet 0  . tamsulosin (FLOMAX) 0.4 MG CAPS capsule Take 1 capsule (0.4 mg total) by mouth daily. 10 capsule 0   No facility-administered medications prior to visit.     Allergies:   Alprazolam; Doxycycline; Hydrocodone; Ibuprofen; Lipitor; and Zolpidem tartrate   Social History   Social History  . Marital Status: Married    Spouse Name: N/A  . Number of Children: N/A  . Years of Education: N/A   Occupational History  . Engineer    Social History Main Topics  . Smoking status: Former Smoker    Types: Cigarettes    Quit date: 04/12/1984  . Smokeless tobacco: Never Used  . Alcohol Use: No  . Drug Use: No  . Sexual Activity: Not Asked   Other Topics Concern  . 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     His youngest daughter is dead     Family History:  The patient's family history includes Arrhythmia in his mother; Prostate cancer in his maternal uncle. There is no history of Colon cancer.   ROS:   Please see the history of present illness.    ROS All other systems reviewed and are negative.   PHYSICAL EXAM:   VS:  BP 118/80 mmHg  Pulse 90  Ht 5\' 7"  (1.702 m)  Wt 160 lb (72.576 kg)  BMI 25.05 kg/m2  GEN: Well nourished, well developed, in no acute distress HEENT: normal Neck: no JVD, carotid bruits, or masses Cardiac: RRR; no murmurs, rubs, or gallops,no edema  Respiratory:  clear to auscultation bilaterally, normal work of breathing GI: soft, nontender, nondistended, + BS MS: no deformity or atrophy Skin: warm and dry, no rash Neuro:  Alert and Oriented x 3, Strength and sensation are intact Psych: euthymic mood, full affect  Wt Readings from Last 3 Encounters:  08/01/15 160 lb (72.576 kg)  06/17/15 165 lb (74.844 kg)  05/02/15 158 lb 11.7 oz (72 kg)      Studies/Labs Reviewed:   EKG:  None today  Recent Labs: 06/17/2015: ALT 17; BUN 10; Creatinine, Ser 0.75; Hemoglobin 12.1*; Platelets 346; Potassium 3.3*; Sodium 136   Lipid Panel    Component Value Date/Time   CHOL 169 11/07/2014 1005   TRIG 99.0 11/07/2014 1005   HDL 44.30 11/07/2014 1005   CHOLHDL 4 11/07/2014 1005   VLDL 19.8 11/07/2014 1005   LDLCALC 105* 11/07/2014 1005   LDLDIRECT 135.6 09/10/2011 0835    Additional studies/ records that were reviewed today include:  Prior hospital records, lab work, EKG, stress test reviewed personally    ASSESSMENT:    1. Pre-procedure lab exam      PLAN:  In order of problems listed above:  Abnormal nuclear stress test  - Inferior wall ischemia, possible RCA disease  - Brief postoperative chest discomfort relieved with  nitroglycerin in the hospital setting. Troponin was negative. I thought originally were that this was musculoskeletal.  - Given his abnormal stress test findings, it would not be unreasonable to pursue diagnostic cardiac catheterization. This will be mainly to exclude the possibility of triple-vessel disease would portend a decrease mortality if revascularized. If there is a lesion in place, we would have to come to complete further medical management. Risks and benefits of cardiac catheterization discussed-death, stroke, heart attack, bleeding, renal impairment. He is willing to proceed.   Medication Adjustments/Labs and Tests Ordered: Current medicines are reviewed at length with the patient today.  Concerns regarding medicines are outlined above.  Medication changes, Labs and Tests ordered today are listed in the Patient Instructions below. Patient Instructions  Medication Instructions:  Same-no changes  Labwork: Today-CBCD, BMET, PT and PTT  Testing/Procedures: Your physician has requested that you have a cardiac catheterization. Cardiac catheterization is used to diagnose and/or treat various heart conditions. Doctors may recommend this procedure for a number of different reasons. The most common reason is to evaluate chest pain. Chest pain can be a symptom of coronary artery disease (CAD), and cardiac catheterization can show whether plaque is narrowing or blocking your heart's arteries. This procedure is also used to evaluate the valves, as well as measure the blood flow and oxygen levels in different parts of your heart. For further information please visit [email or web address]. Please follow instruction sheet, as given.   Follow-Up: After Cath     If you need a refill on your cardiac medications before your next appointment, please call your pharmacy.       Signed, Candee Furbish, MD  08/01/2015 3:38 PM    [address], [place], [place]   [address] Phone: [phone]; Fax: [phone]

## 2015-08-01 NOTE — Patient Instructions (Addendum)
Medication Instructions:  Same-no changes  Labwork: Today-CBCD, BMET, PT and PTT  Testing/Procedures: Your physician has requested that you have a cardiac catheterization. Cardiac catheterization is used to diagnose and/or treat various heart conditions. Doctors may recommend this procedure for a number of different reasons. The most common reason is to evaluate chest pain. Chest pain can be a symptom of coronary artery disease (CAD), and cardiac catheterization can show whether plaque is narrowing or blocking your heart's arteries. This procedure is also used to evaluate the valves, as well as measure the blood flow and oxygen levels in different parts of your heart. For further information please visit HugeFiesta.[email or web address]. Please follow instruction sheet, as given.   Follow-Up: After Cath     If you need a refill on your cardiac medications before your next appointment, please call your pharmacy.

## 2015-08-03 ENCOUNTER — Telehealth: Payer: Self-pay | Admitting: Family Medicine

## 2015-08-03 NOTE — Telephone Encounter (Signed)
I called pt.  Given the CP after the surgery and the abnormal stress test, I agree with the plan from cards.   I have great confidence in Dr. Marlou Porch and I appreciate his input.   I talked with patient about the recent event and answered questions to the best of my ability.  He thanked me for the call.  I'll await reports from cards.

## 2015-08-03 NOTE — Telephone Encounter (Signed)
Below is a message sent by patient on the [facility] Website:  For Doctor Elveria Rising. [name] - After my left hip replacement on March 1 someone in the recovery room asked me how I felt and I made a HUGE mistake when I said my chest hurt somewhat. They put me in the heart attack wing and kept taking more and more of my blood. Dr. Marlou Porch told Dr. Wynelle Link that everything looked great and Dr. Wynelle Link let me go home March 2. Hip is great, feel like a new man - except Dr. Marlou Porch wanted me to go for heart stress test, which showed a dark spot at the bottom of my donut (I mean my heart). Now I have to have a heart catheterization on June 7 so I just wanted to make sure you were aware of what they are doing to me. I'm anxious and nervous about the procedure since I just had another grand daughter on May 30. I feel healthy and strong thanks to you and Dr. Wynelle Link so keep tabs on what they are doing to me!

## 2015-08-08 ENCOUNTER — Ambulatory Visit (HOSPITAL_COMMUNITY)
Admission: RE | Admit: 2015-08-08 | Discharge: 2015-08-09 | Disposition: A | Discharge status: Home / Self Care | Payer: Medicare Other | Source: Ambulatory Visit | Attending: Cardiology | Admitting: Cardiology

## 2015-08-08 ENCOUNTER — Encounter (HOSPITAL_COMMUNITY)
Admission: RE | Disposition: A | Discharge status: Home / Self Care | Payer: Self-pay | Source: Ambulatory Visit | Attending: Cardiology

## 2015-08-08 ENCOUNTER — Encounter (HOSPITAL_COMMUNITY): Payer: Self-pay | Admitting: General Practice

## 2015-08-08 DIAGNOSIS — Z7901 Long term (current) use of anticoagulants: Secondary | ICD-10-CM | POA: Diagnosis not present

## 2015-08-08 DIAGNOSIS — Z87891 Personal history of nicotine dependence: Secondary | ICD-10-CM | POA: Insufficient documentation

## 2015-08-08 DIAGNOSIS — E785 Hyperlipidemia, unspecified: Secondary | ICD-10-CM | POA: Diagnosis not present

## 2015-08-08 DIAGNOSIS — I2510 Atherosclerotic heart disease of native coronary artery without angina pectoris: Secondary | ICD-10-CM | POA: Diagnosis not present

## 2015-08-08 DIAGNOSIS — R9439 Abnormal result of other cardiovascular function study: Secondary | ICD-10-CM | POA: Diagnosis present

## 2015-08-08 DIAGNOSIS — I10 Essential (primary) hypertension: Secondary | ICD-10-CM | POA: Diagnosis not present

## 2015-08-08 DIAGNOSIS — Z955 Presence of coronary angioplasty implant and graft: Secondary | ICD-10-CM

## 2015-08-08 DIAGNOSIS — Z96641 Presence of right artificial hip joint: Secondary | ICD-10-CM | POA: Insufficient documentation

## 2015-08-08 DIAGNOSIS — M1990 Unspecified osteoarthritis, unspecified site: Secondary | ICD-10-CM | POA: Diagnosis not present

## 2015-08-08 DIAGNOSIS — K219 Gastro-esophageal reflux disease without esophagitis: Secondary | ICD-10-CM | POA: Diagnosis present

## 2015-08-08 DIAGNOSIS — Z7982 Long term (current) use of aspirin: Secondary | ICD-10-CM | POA: Insufficient documentation

## 2015-08-08 HISTORY — DX: Atherosclerotic heart disease of native coronary artery without angina pectoris: I25.10

## 2015-08-08 HISTORY — PX: CORONARY STENT PLACEMENT: SHX1402

## 2015-08-08 HISTORY — PX: CARDIAC CATHETERIZATION: SHX172

## 2015-08-08 LAB — CBC
HCT: 35.9 % — ABNORMAL LOW (ref 39.0–52.0)
Hemoglobin: 10.9 g/dL — ABNORMAL LOW (ref 13.0–17.0)
MCH: 24.8 pg — AB (ref 26.0–34.0)
MCHC: 30.4 g/dL (ref 30.0–36.0)
MCV: 81.6 fL (ref 78.0–100.0)
PLATELETS: 329 10*3/uL (ref 150–400)
RBC: 4.4 MIL/uL (ref 4.22–5.81)
RDW: 13.6 % (ref 11.5–15.5)
WBC: 6.2 10*3/uL (ref 4.0–10.5)

## 2015-08-08 LAB — POCT ACTIVATED CLOTTING TIME
ACTIVATED CLOTTING TIME: 208 s
Activated Clotting Time: 263 seconds

## 2015-08-08 LAB — CREATININE, SERUM
CREATININE: 0.7 mg/dL (ref 0.61–1.24)
GFR calc Af Amer: >60 mL/min (ref >60)
GFR calc non Af Amer: >60 mL/min (ref >60)

## 2015-08-08 SURGERY — LEFT HEART CATH AND CORONARY ANGIOGRAPHY
Anesthesia: LOCAL

## 2015-08-08 MED ORDER — HEPARIN (PORCINE) IN NACL 2-0.9 UNIT/ML-% IJ SOLN
INTRAMUSCULAR | Status: DC | PRN
  Administered 2015-08-08: 10 mL via INTRA_ARTERIAL

## 2015-08-08 MED ORDER — ASPIRIN 81 MG PO CHEW
CHEWABLE_TABLET | ORAL | Status: AC
  Administered 2015-08-08: 81 mg via ORAL
  Filled 2015-08-08: qty 1

## 2015-08-08 MED ORDER — SODIUM CHLORIDE 0.9% FLUSH: 3.0000 mL | INTRAVENOUS | Status: DC | PRN

## 2015-08-08 MED ORDER — SODIUM CHLORIDE 0.9 % IV SOLN
INTRAVENOUS | Status: DC | PRN
  Administered 2015-08-08: 99 mL/h via INTRAVENOUS

## 2015-08-08 MED ORDER — VERAPAMIL HCL 2.5 MG/ML IV SOLN
INTRAVENOUS | Status: DC | PRN
  Administered 2015-08-08: 2 mg via INTRA_ARTERIAL

## 2015-08-08 MED ORDER — TIROFIBAN HCL IN NACL 5-0.9 MG/100ML-% IV SOLN
0.1500 ug/kg/min | INTRAVENOUS | Status: AC
  Filled 2015-08-08: qty 100

## 2015-08-08 MED ORDER — MIDAZOLAM HCL 2 MG/2ML IJ SOLN
INTRAMUSCULAR | Status: DC | PRN
  Administered 2015-08-08: 1 mg via INTRAVENOUS

## 2015-08-08 MED ORDER — IOPAMIDOL (ISOVUE-370) INJECTION 76%
INTRAVENOUS | Status: AC
  Filled 2015-08-08: qty 100

## 2015-08-08 MED ORDER — FENTANYL CITRATE (PF) 100 MCG/2ML IJ SOLN
INTRAMUSCULAR | Status: DC | PRN
  Administered 2015-08-08: 25 ug via INTRAVENOUS

## 2015-08-08 MED ORDER — AMLODIPINE BESYLATE 10 MG PO TABS
10.0000 mg | ORAL_TABLET | Freq: Every day | ORAL | Status: DC
  Administered 2015-08-08: 18:00:00 10 mg via ORAL
  Filled 2015-08-08: qty 1

## 2015-08-08 MED ORDER — ASPIRIN EC 81 MG PO TBEC
81.0000 mg | DELAYED_RELEASE_TABLET | Freq: Every day | ORAL | Status: DC
  Administered 2015-08-08: 81 mg via ORAL
  Filled 2015-08-08: qty 1

## 2015-08-08 MED ORDER — HEPARIN SODIUM (PORCINE) 1000 UNIT/ML IJ SOLN
INTRAMUSCULAR | Status: DC | PRN
  Administered 2015-08-08: 3000 [IU] via INTRAVENOUS
  Administered 2015-08-08: 5000 [IU] via INTRAVENOUS

## 2015-08-08 MED ORDER — FENTANYL CITRATE (PF) 100 MCG/2ML IJ SOLN
INTRAMUSCULAR | Status: DC | PRN
  Administered 2015-08-08 (×2): 25 ug via INTRAVENOUS

## 2015-08-08 MED ORDER — ONDANSETRON HCL 4 MG/2ML IJ SOLN: 4.0000 mg | Freq: Four times a day (QID) | INTRAMUSCULAR | Status: DC | PRN

## 2015-08-08 MED ORDER — HEPARIN SODIUM (PORCINE) 5000 UNIT/ML IJ SOLN
5000.0000 [IU] | Freq: Three times a day (TID) | INTRAMUSCULAR | Status: DC
  Administered 2015-08-09: 05:00:00 5000 [IU] via SUBCUTANEOUS
  Filled 2015-08-08: qty 1

## 2015-08-08 MED ORDER — VERAPAMIL HCL 2.5 MG/ML IV SOLN
INTRAVENOUS | Status: AC
  Filled 2015-08-08: qty 2

## 2015-08-08 MED ORDER — CLOPIDOGREL BISULFATE 300 MG PO TABS
ORAL_TABLET | ORAL | Status: AC
  Filled 2015-08-08: qty 2

## 2015-08-08 MED ORDER — TRAMADOL HCL 50 MG PO TABS
50.0000 mg | ORAL_TABLET | ORAL | Status: DC | PRN
  Administered 2015-08-08 – 2015-08-09 (×3): 50 mg via ORAL
  Filled 2015-08-08 (×3): qty 1

## 2015-08-08 MED ORDER — ACETAMINOPHEN 325 MG PO TABS: 650.0000 mg | ORAL_TABLET | ORAL | Status: DC | PRN

## 2015-08-08 MED ORDER — MIDAZOLAM HCL 2 MG/2ML IJ SOLN
INTRAMUSCULAR | Status: AC
  Filled 2015-08-08: qty 2

## 2015-08-08 MED ORDER — SODIUM CHLORIDE 0.9 % IV SOLN: 250.0000 mL | INTRAVENOUS | Status: DC | PRN

## 2015-08-08 MED ORDER — ASPIRIN 81 MG PO CHEW
81.0000 mg | CHEWABLE_TABLET | ORAL | Status: AC
  Administered 2015-08-08: 81 mg via ORAL

## 2015-08-08 MED ORDER — PANTOPRAZOLE SODIUM 40 MG PO TBEC
40.0000 mg | DELAYED_RELEASE_TABLET | Freq: Every day | ORAL | Status: DC
  Administered 2015-08-08 – 2015-08-09 (×2): 40 mg via ORAL
  Filled 2015-08-08 (×2): qty 1

## 2015-08-08 MED ORDER — METHOCARBAMOL 500 MG PO TABS: 500.0000 mg | ORAL_TABLET | Freq: Four times a day (QID) | ORAL | Status: DC | PRN

## 2015-08-08 MED ORDER — MIDAZOLAM HCL 2 MG/2ML IJ SOLN
INTRAMUSCULAR | Status: DC | PRN
  Administered 2015-08-08: 1 mg via INTRAVENOUS
  Administered 2015-08-08: 2 mg via INTRAVENOUS

## 2015-08-08 MED ORDER — SODIUM CHLORIDE 0.9% FLUSH
3.0000 mL | INTRAVENOUS | Status: DC | PRN
Start: 2015-08-08 — End: 2015-08-09

## 2015-08-08 MED ORDER — ANGIOPLASTY BOOK
Freq: Once | Status: AC
  Administered 2015-08-08: 20:00:00
  Filled 2015-08-08: qty 1

## 2015-08-08 MED ORDER — HEPARIN SODIUM (PORCINE) 1000 UNIT/ML IJ SOLN
INTRAMUSCULAR | Status: AC
  Filled 2015-08-08: qty 1

## 2015-08-08 MED ORDER — IOPAMIDOL (ISOVUE-370) INJECTION 76%
INTRAVENOUS | Status: AC
  Filled 2015-08-08: qty 50

## 2015-08-08 MED ORDER — ASPIRIN 81 MG PO CHEW: 81.0000 mg | CHEWABLE_TABLET | Freq: Every day | ORAL | Status: DC

## 2015-08-08 MED ORDER — CLOPIDOGREL BISULFATE 75 MG PO TABS
75.0000 mg | ORAL_TABLET | Freq: Every day | ORAL | Status: DC
  Administered 2015-08-09: 75 mg via ORAL
  Filled 2015-08-08: qty 1

## 2015-08-08 MED ORDER — SODIUM CHLORIDE 0.9% FLUSH: 3.0000 mL | Freq: Two times a day (BID) | INTRAVENOUS | Status: DC

## 2015-08-08 MED ORDER — DIAZEPAM 2 MG PO TABS
2.0000 mg | ORAL_TABLET | Freq: Every evening | ORAL | Status: DC | PRN
  Administered 2015-08-09: 2 mg via ORAL
  Filled 2015-08-08: qty 1

## 2015-08-08 MED ORDER — TIROFIBAN HCL IN NACL 5-0.9 MG/100ML-% IV SOLN
INTRAVENOUS | Status: AC
  Filled 2015-08-08: qty 100

## 2015-08-08 MED ORDER — HEPARIN SODIUM (PORCINE) 1000 UNIT/ML IJ SOLN
INTRAMUSCULAR | Status: DC | PRN
  Administered 2015-08-08: 4000 [IU] via INTRAVENOUS

## 2015-08-08 MED ORDER — SODIUM CHLORIDE 0.9 % WEIGHT BASED INFUSION: 1.0000 mL/kg/h | INTRAVENOUS | Status: DC

## 2015-08-08 MED ORDER — ROPINIROLE HCL 0.25 MG PO TABS
0.2500 mg | ORAL_TABLET | Freq: Every evening | ORAL | Status: DC | PRN
  Filled 2015-08-08: qty 2

## 2015-08-08 MED ORDER — SODIUM CHLORIDE 0.9 % WEIGHT BASED INFUSION
3.0000 mL/kg/h | INTRAVENOUS | Status: DC
  Administered 2015-08-08: 3 mL/kg/h via INTRAVENOUS

## 2015-08-08 MED ORDER — LIDOCAINE HCL (PF) 1 % IJ SOLN
INTRAMUSCULAR | Status: DC | PRN
  Administered 2015-08-08: 2 mL

## 2015-08-08 MED ORDER — TIROFIBAN (AGGRASTAT) BOLUS VIA INFUSION: INTRAVENOUS | Status: DC | PRN

## 2015-08-08 MED ORDER — CLOPIDOGREL BISULFATE 300 MG PO TABS
ORAL_TABLET | ORAL | Status: DC | PRN
  Administered 2015-08-08: 600 mg via ORAL

## 2015-08-08 MED ORDER — FENTANYL CITRATE (PF) 100 MCG/2ML IJ SOLN
INTRAMUSCULAR | Status: AC
  Filled 2015-08-08: qty 2

## 2015-08-08 MED ORDER — TIROFIBAN (AGGRASTAT) BOLUS VIA INFUSION
INTRAVENOUS | Status: DC | PRN
  Administered 2015-08-08: 1815 ug via INTRAVENOUS

## 2015-08-08 MED ORDER — IOPAMIDOL (ISOVUE-370) INJECTION 76%
INTRAVENOUS | Status: DC | PRN
  Administered 2015-08-08: 175 mL via INTRAVENOUS

## 2015-08-08 MED ORDER — VERAPAMIL HCL 2.5 MG/ML IV SOLN: INTRAVENOUS | Status: DC | PRN

## 2015-08-08 MED ORDER — SODIUM CHLORIDE 0.9 % WEIGHT BASED INFUSION: 1.0000 mL/kg/h | INTRAVENOUS | Status: AC

## 2015-08-08 SURGICAL SUPPLY — 19 items
BALLN EMERGE MR 2.5X15 (BALLOONS) ×2
BALLN ~~LOC~~ EMERGE MR 3.5X15 (BALLOONS) ×2
BALLOON EMERGE MR 2.5X15 (BALLOONS) ×1 IMPLANT
BALLOON ~~LOC~~ EMERGE MR 3.5X15 (BALLOONS) ×1 IMPLANT
CATH INFINITI 5 FR JL3.5 (CATHETERS) ×2 IMPLANT
CATH INFINITI JR4 5F (CATHETERS) ×2 IMPLANT
GLIDESHEATH SLEND SS 6F .021 (SHEATH) ×2 IMPLANT
GUIDE CATH RUNWAY 6FR CLS3 (CATHETERS) ×2 IMPLANT
KIT ENCORE 26 ADVANTAGE (KITS) ×2 IMPLANT
KIT HEART LEFT (KITS) ×2 IMPLANT
PACK CARDIAC CATHETERIZATION (CUSTOM PROCEDURE TRAY) ×2 IMPLANT
STENT PROMUS PREM MR 3.5X20 (Permanent Stent) ×2 IMPLANT
SYR MEDRAD MARK V 150ML (SYRINGE) ×2 IMPLANT
TRANSDUCER W/STOPCOCK (MISCELLANEOUS) ×2 IMPLANT
TUBING CIL FLEX 10 FLL-RA (TUBING) ×2 IMPLANT
VALVE GUARDIAN II ~~LOC~~ HEMO (MISCELLANEOUS) ×2 IMPLANT
WIRE ASAHI PROWATER 180CM (WIRE) ×2 IMPLANT
WIRE HI TORQ VERSACORE-J 145CM (WIRE) ×2 IMPLANT
WIRE SAFE-T 1.5MM-J .035X260CM (WIRE) ×2 IMPLANT

## 2015-08-08 NOTE — Research (Signed)
CADLAD Informed Consent   Subject Name: [name]  Subject met inclusion and exclusion criteria.  The informed consent form, study requirements and expectations were reviewed with the subject and questions and concerns were addressed prior to the signing of the consent form.  The subject verbalized understanding of the trail requirements.  The subject agreed to participate in the CADLAD trial and signed the informed consent.  The informed consent was obtained prior to performance of any protocol-specific procedures for the subject.  A copy of the signed informed consent was given to the subject and a copy was placed in the subject's medical record.  Sandie Ano 08/08/2015, 7:38

## 2015-08-08 NOTE — H&P (View-Only) (Signed)
Cardiology Office Note    Date:  08/01/2015   ID:  Keith, Fritz February 26, 1946, MRN [id number]  PCP:  Elsie Stain, MD  Cardiologist:   Candee Furbish, MD     History of Present Illness:  [name] is a 70 y.o. male here for evaluation of abnormal stress test.   Nuclear stress EF: 53%.  There was no ST segment deviation noted during stress.  No T wave inversion was noted during stress.  Defect 1: There is a medium defect of mild severity present in the basal inferoseptal, mid inferoseptal and apical inferior location.  Findings consistent with mild ischemia.  This is an intermediate risk study.  The left ventricular ejection fraction is mildly decreased (45-54%).  This may represent RCA ischemia.  He was in the emergency room previously on 06/17/15 with left lower quadrant pain and back pain, 4 mm stone on CT scan. Fluids administered. Urology follow-up recommended.   Back on 05/03/15 he was at [facility] with somewhat atypical chest pain without EKG changes, troponin was normal. This was surrounding his left total hip arthroplasty by Dr.Aluisio. No prior history of CAD, MI or heart failure. I felt comfortable with discharge at that time. Prior mission trip to [place], coronary concrete blocks without any difficulty, no shortness of breath.  Postoperatively however he did develop some left anterior chest wall pain that radiated laterally. One sublingual nitroglycerin helped resolve the pain. EKG was normal.  He is back now for evaluation of his abnormal stress test.Overall he's been feeling quite well. No significant chest discomfort, shortness of breath, diaphoresis, nausea. He did return to the hospital with severe kidney stone pain. This has resolved.   Past Medical History  Diagnosis Date  . Syncope     negative cardiac w/u per Dr Irish Lack  . OA (osteoarthritis)     with r hip replacement  . Nephrolithiasis   . Tick bite 2011    likely tick associated  illness, treated with doxy with resolution,  . GERD (gastroesophageal reflux disease)   . Viral exanthem, unspecified   . Osteoarthrosis involving, or with mention of more than one site, but not specified as generalized, site unspecified(715.80)   . History of BPH   . Hyperlipidemia   . Hypertension   . Adjustment reaction with physical symptoms   . Elevated PSA     h/o with normal bx per URO  . Cancer, skin, squamous cell     R shoulder, removed per derm 2012    Past Surgical History  Procedure Laterality Date  . Total hip arthroplasty      right  . Achilles tendon repair  1984  . Shoulder arthroscopy  2012    R rotator cuff (biceps tear not repaired)  . Joint replacement    . Skin cancer excision      right shoulder  . Total hip arthroplasty Left 05/02/2015    Procedure: TOTAL LEFT HIP ARTHROPLASTY ANTERIOR APPROACH;  Surgeon: Gaynelle Arabian, MD;  Location: [facility];  Service: Orthopedics;  Laterality: Left;    Current Medications: Outpatient Prescriptions Prior to Visit  Medication Sig Dispense Refill  . aspirin EC 81 MG tablet Take 81 mg by mouth daily.    . bacitracin-polymyxin b (POLYSPORIN) ointment Apply 1 application topically 2 (two) times daily as needed (Wound care).    . celecoxib (CELEBREX) 200 MG capsule Take 200 mg by mouth daily.    . diazepam (VALIUM) 2 MG tablet Take 2  tablets (4 mg total) by mouth at bedtime as needed (insomnia). 180 tablet 1  . methocarbamol (ROBAXIN) 500 MG tablet Take 1 tablet (500 mg total) by mouth every 6 (six) hours as needed for muscle spasms. 90 tablet 0  . omeprazole (PRILOSEC) 20 MG capsule Take 1 capsule by mouth  daily 90 capsule 0  . ondansetron (ZOFRAN ODT) 4 MG disintegrating tablet Take 1 tablet (4 mg total) by mouth every 8 (eight) hours as needed for nausea or vomiting. 20 tablet 0  . oxyCODONE (OXY IR/ROXICODONE) 5 MG immediate release tablet Take 1-2 tablets (5-10 mg total) by mouth every 3 (three) hours as needed for moderate  pain or severe pain. 90 tablet 0  . oxyCODONE-acetaminophen (PERCOCET/ROXICET) 5-325 MG tablet Take 1 tablet by mouth every 6 (six) hours as needed for severe pain. 15 tablet 0  . simethicone (GAS-X) 80 MG chewable tablet Chew 80 mg by mouth every 6 (six) hours as needed for flatulence.    . traMADol (ULTRAM) 50 MG tablet Take 1-2 tablets (50-100 mg total) by mouth every 6 (six) hours as needed (mild to moderate pain). 80 tablet 1  . rOPINIRole (REQUIP) 0.5 MG tablet Take 1 tablet (0.5 mg total) by mouth at bedtime. (Patient taking differently: Take 0.5 mg by mouth at bedtime as needed (Restless leg). ) 90 tablet 3  . amLODipine (NORVASC) 10 MG tablet Take 1 tablet (10 mg total) by mouth daily. (Patient taking differently: Take 10 mg by mouth daily after supper. ) 90 tablet 3  . rivaroxaban (XARELTO) 10 MG TABS tablet Take 1 tablet (10 mg total) by mouth daily with breakfast. Take Xarelto for two and a half more weeks, then discontinue Xarelto. Once the patient has completed the Xarelto, they may resume the 81 mg Aspirin. (Patient not taking: Reported on 06/17/2015) 20 tablet 0  . tamsulosin (FLOMAX) 0.4 MG CAPS capsule Take 1 capsule (0.4 mg total) by mouth daily. 10 capsule 0   No facility-administered medications prior to visit.     Allergies:   Alprazolam; Doxycycline; Hydrocodone; Ibuprofen; Lipitor; and Zolpidem tartrate   Social History   Social History  . Marital Status: Married    Spouse Name: N/A  . Number of Children: N/A  . Years of Education: N/A   Occupational History  . Engineer    Social History Main Topics  . Smoking status: Former Smoker    Types: Cigarettes    Quit date: 04/12/1984  . Smokeless tobacco: Never Used  . Alcohol Use: No  . Drug Use: No  . Sexual Activity: Not Asked   Other Topics Concern  . 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     His youngest daughter is dead     Family History:  The patient's family history includes Arrhythmia in his mother; Prostate cancer in his maternal uncle. There is no history of Colon cancer.   ROS:   Please see the history of present illness.    ROS All other systems reviewed and are negative.   PHYSICAL EXAM:   VS:  BP 118/80 mmHg  Pulse 90  Ht 5\' 7"  (1.702 m)  Wt 160 lb (72.576 kg)  BMI 25.05 kg/m2  GEN: Well nourished, well developed, in no acute distress HEENT: normal Neck: no JVD, carotid bruits, or masses Cardiac: RRR; no murmurs, rubs, or gallops,no edema  Respiratory:  clear to auscultation bilaterally, normal work of breathing GI: soft, nontender, nondistended, + BS MS: no deformity or atrophy Skin: warm and dry, no rash Neuro:  Alert and Oriented x 3, Strength and sensation are intact Psych: euthymic mood, full affect  Wt Readings from Last 3 Encounters:  08/01/15 160 lb (72.576 kg)  06/17/15 165 lb (74.844 kg)  05/02/15 158 lb 11.7 oz (72 kg)      Studies/Labs Reviewed:   EKG:  None today  Recent Labs: 06/17/2015: ALT 17; BUN 10; Creatinine, Ser 0.75; Hemoglobin 12.1*; Platelets 346; Potassium 3.3*; Sodium 136   Lipid Panel    Component Value Date/Time   CHOL 169 11/07/2014 1005   TRIG 99.0 11/07/2014 1005   HDL 44.30 11/07/2014 1005   CHOLHDL 4 11/07/2014 1005   VLDL 19.8 11/07/2014 1005   LDLCALC 105* 11/07/2014 1005   LDLDIRECT 135.6 09/10/2011 0835    Additional studies/ records that were reviewed today include:  Prior hospital records, lab work, EKG, stress test reviewed personally    ASSESSMENT:    1. Pre-procedure lab exam      PLAN:  In order of problems listed above:  Abnormal nuclear stress test  - Inferior wall ischemia, possible RCA disease  - Brief postoperative chest discomfort relieved with  nitroglycerin in the hospital setting. Troponin was negative. I thought originally were that this was musculoskeletal.  - Given his abnormal stress test findings, it would not be unreasonable to pursue diagnostic cardiac catheterization. This will be mainly to exclude the possibility of triple-vessel disease would portend a decrease mortality if revascularized. If there is a lesion in place, we would have to come to complete further medical management. Risks and benefits of cardiac catheterization discussed-death, stroke, heart attack, bleeding, renal impairment. He is willing to proceed.   Medication Adjustments/Labs and Tests Ordered: Current medicines are reviewed at length with the patient today.  Concerns regarding medicines are outlined above.  Medication changes, Labs and Tests ordered today are listed in the Patient Instructions below. Patient Instructions  Medication Instructions:  Same-no changes  Labwork: Today-CBCD, BMET, PT and PTT  Testing/Procedures: Your physician has requested that you have a cardiac catheterization. Cardiac catheterization is used to diagnose and/or treat various heart conditions. Doctors may recommend this procedure for a number of different reasons. The most common reason is to evaluate chest pain. Chest pain can be a symptom of coronary artery disease (CAD), and cardiac catheterization can show whether plaque is narrowing or blocking your heart's arteries. This procedure is also used to evaluate the valves, as well as measure the blood flow and oxygen levels in different parts of your heart. For further information please visit HugeFiesta.[email or web address]. Please follow instruction sheet, as given.   Follow-Up: After Cath     If you need a refill on your cardiac medications before your next appointment, please call your pharmacy.       Signed, Candee Furbish, MD  08/01/2015 3:38 PM    [address], [place], [place]   [address] Phone: [phone]; Fax: [phone]

## 2015-08-08 NOTE — Progress Notes (Signed)
TR BAND REMOVAL  LOCATION:    right radial  DEFLATED PER PROTOCOL:    Yes.    TIME BAND OFF / DRESSING APPLIED:    1400   SITE UPON ARRIVAL:    Level 0  SITE AFTER BAND REMOVAL:    Level 0  CIRCULATION SENSATION AND MOVEMENT:    Within Normal Limits   Yes.    COMMENTS:   Rechecked at 1430 with no change in assessment. No change with frequent checks during shift. Dressing dry and intact, pulses palpable, no bleeding, bruising or hematoma.

## 2015-08-08 NOTE — Interval H&P Note (Signed)
History and Physical Interval Note:  08/08/2015 6:40 AM  [name]  has presented today for surgery, with the diagnosis of Abnormal Stress Test  The various methods of treatment have been discussed with the patient and family. After consideration of risks, benefits and other options for treatment, the patient has consented to  Procedure(s): Left Heart Cath and Coronary Angiography (N/A) as a surgical intervention .  The patient's history has been reviewed, patient examined, no change in status, stable for surgery.  I have reviewed the patient's chart and labs.  Questions were answered to the patient's satisfaction.     [employer]

## 2015-08-09 ENCOUNTER — Encounter (HOSPITAL_COMMUNITY): Payer: Self-pay | Admitting: Nurse Practitioner

## 2015-08-09 DIAGNOSIS — R9439 Abnormal result of other cardiovascular function study: Secondary | ICD-10-CM | POA: Diagnosis not present

## 2015-08-09 DIAGNOSIS — K219 Gastro-esophageal reflux disease without esophagitis: Secondary | ICD-10-CM | POA: Diagnosis not present

## 2015-08-09 DIAGNOSIS — E785 Hyperlipidemia, unspecified: Secondary | ICD-10-CM | POA: Diagnosis not present

## 2015-08-09 DIAGNOSIS — I10 Essential (primary) hypertension: Secondary | ICD-10-CM | POA: Diagnosis not present

## 2015-08-09 DIAGNOSIS — I2510 Atherosclerotic heart disease of native coronary artery without angina pectoris: Secondary | ICD-10-CM | POA: Diagnosis not present

## 2015-08-09 LAB — CBC
HEMATOCRIT: 33.9 % — AB (ref 39.0–52.0)
HEMOGLOBIN: 10.5 g/dL — AB (ref 13.0–17.0)
MCH: 25.3 pg — AB (ref 26.0–34.0)
MCHC: 31 g/dL (ref 30.0–36.0)
MCV: 81.7 fL (ref 78.0–100.0)
Platelets: 308 10*3/uL (ref 150–400)
RBC: 4.15 MIL/uL — AB (ref 4.22–5.81)
RDW: 13.6 % (ref 11.5–15.5)
WBC: 7.8 10*3/uL (ref 4.0–10.5)

## 2015-08-09 LAB — BASIC METABOLIC PANEL
ANION GAP: 5 (ref 5–15)
BUN: 9 mg/dL (ref 6–20)
CHLORIDE: 104 mmol/L (ref 101–111)
CO2: 28 mmol/L (ref 22–32)
Calcium: 8.6 mg/dL — ABNORMAL LOW (ref 8.9–10.3)
Creatinine, Ser: 0.78 mg/dL (ref 0.61–1.24)
GFR calc Af Amer: >60 mL/min (ref >60)
GFR calc non Af Amer: >60 mL/min (ref >60)
Glucose, Bld: 96 mg/dL (ref 65–99)
POTASSIUM: 3.9 mmol/L (ref 3.5–5.1)
SODIUM: 137 mmol/L (ref 135–145)

## 2015-08-09 MED ORDER — ROSUVASTATIN CALCIUM 20 MG PO TABS: 20.0000 mg | ORAL_TABLET | Freq: Every day | ORAL | Status: DC

## 2015-08-09 MED ORDER — PANTOPRAZOLE SODIUM 40 MG PO TBEC: 40.0000 mg | DELAYED_RELEASE_TABLET | Freq: Every day | ORAL | Status: DC

## 2015-08-09 MED ORDER — NITROGLYCERIN 0.4 MG SL SUBL: 0.4000 mg | SUBLINGUAL_TABLET | SUBLINGUAL | Status: DC | PRN

## 2015-08-09 MED ORDER — CLOPIDOGREL BISULFATE 75 MG PO TABS: 75.0000 mg | ORAL_TABLET | Freq: Every day | ORAL | Status: DC

## 2015-08-09 MED FILL — Tirofiban HCl in NaCl 0.9% IV Soln 5 MG/100ML (Base Equiv): INTRAVENOUS | Qty: 100 | Status: AC

## 2015-08-09 NOTE — Discharge Instructions (Addendum)
**  PLEASE REMEMBER TO BRING ALL OF YOUR MEDICATIONS TO EACH OF YOUR FOLLOW-UP OFFICE VISITS.  NO HEAVY LIFTING OR SEXUAL ACTIVITY X 7 DAYS. NO DRIVING X 3-5 DAYS. NO SOAKING BATHS, HOT TUBS, POOLS, ETC., X 7 DAYS.  Groin Site Care Refer to this sheet in the next few weeks. These instructions provide you with information on caring for yourself after your procedure. Your caregiver may also give you more specific instructions. Your treatment has been planned according to current medical practices, but problems sometimes occur. Call your caregiver if you have any problems or questions after your procedure. HOME CARE INSTRUCTIONS  You may shower 24 hours after the procedure. Remove the bandage (dressing) and gently wash the site with plain soap and water. Gently pat the site dry.   Do not apply powder or lotion to the site.   Do not sit in a bathtub, swimming pool, or whirlpool for 5 to 7 days.   No bending, squatting, or lifting anything over 10 pounds (4.5 kg) as directed by your caregiver.   Inspect the site at least twice daily.   Do not drive home if you are discharged the same day of the procedure. Have someone else drive you.   What to expect:  Any bruising will usually fade within 1 to 2 weeks.   Blood that collects in the tissue (hematoma) may be painful to the touch. It should usually decrease in size and tenderness within 1 to 2 weeks.  SEEK IMMEDIATE MEDICAL CARE IF:  You have unusual pain at the groin site or down the affected leg.   You have redness, warmth, swelling, or pain at the groin site.   You have drainage (other than a small amount of blood on the dressing).   You have chills.   You have a fever or persistent symptoms for more than 72 hours.   You have a fever and your symptoms suddenly get worse.   Your leg becomes pale, cool, tingly, or numb.  You have heavy bleeding from the site. Hold pressure on the site.      10 Habits of Highly Healthy  [facility] wants to help you get well and stay well.  Live a longer, healthier life by practicing healthy habits every day.  1.  Visit your primary care provider regularly. 2.  Make time for family and friends.  Healthy relationships are important. 3.  Take medications as directed by your provider. 4.  Maintain a healthy weight and a trim waistline. 5.  Eat healthy meals and snacks, rich in fruits, vegetables, whole grains, and lean proteins. 6.  Get moving every day - aim for 150 minutes of moderate physical activity each week. 7.  Don't smoke. 8.  Avoid alcohol or drink in moderation. 9.  Manage stress through meditation or mindful relaxation. 10.  Get seven to nine hours of quality sleep each night.  Want more information on healthy habits?  To learn more about these and other healthy habits, visit [employer]. _____________     MEDICATIONS TO STOP TAKING:  PRILOSEC CELEBREX AND ALL NSAIDS (MOTRIN, IBUPROFEN, ALEVE)

## 2015-08-09 NOTE — Progress Notes (Signed)
    Started Crestor 20. Prior joint aches with Lipitor. Mid LAD stent Candee Furbish, MD

## 2015-08-09 NOTE — Progress Notes (Signed)
CARDIAC REHAB PHASE I   PRE:  Rate/Rhythm: 72 SR  BP:  Supine: 118/67  Sitting:   Standing:    SaO2:   MODE:  Ambulation: 1000 ft   POST:  Rate/Rhythm: 87 SR  BP:  Supine:   Sitting: 139/83  Standing:    SaO2:  [id number] Pt walked 1000 ft with steady gait. No CP. Tolerated well. Education completed with pt and wife who voiced understanding. Stressed importance of plavix with stent. Discussed CRP 2 and referring to [facility] program. Reviewed ex ed, heart healthy diet and risk factors, NTG use.   Graylon Good, RN BSN  08/09/2015 9:31 AM

## 2015-08-09 NOTE — Discharge Summary (Signed)
Discharge Summary    Patient ID: [name],  MRN: [id number], DOB/AGE: 1946-01-21 70 y.o.  Admit date: 08/08/2015 Discharge date: 08/09/2015  Primary Care Provider: Elsie Stain Primary Cardiologist: Jerilynn Mages. Gillian Shields, MD   Discharge Diagnoses    Principal Problem:   Abnormal stress test  **s/p PCI and drug-eluting stent placement of the LAD this admission.  Active Problems:   CAD (coronary artery disease)   Essential hypertension   HLD (hyperlipidemia)   GERD   Allergies Allergies  Allergen Reactions  . Xarelto [Rivaroxaban] Rash    Rash and itching with blisters.  . Alprazolam Other (See Comments)    abnormal dreams  . Doxycycline Other (See Comments)    GI upset  . Hydrocodone Other (See Comments)    Makes head buzz - tolerates oxycodone  . Ibuprofen Other (See Comments)    Cannot take with celebrex  . Lipitor [Atorvastatin] Other (See Comments)    Joints ached  . Tamsulosin Other (See Comments)    Malaise/ lethargy  . Zolpidem Tartrate Other (See Comments)    Terrible dreams     Diagnostic Studies/Procedures    Cardiac Catheterization and Percutaneous Coronary Intervention 6.7.2017  Coronary Findings     Dominance: Right    Left Main  Vessel was injected. Vessel is normal in caliber. Vessel is angiographically normal.      Left Anterior Descending  Vessel was injected. There is severe focal disease in the vessel. 85% focal lesion after the first diagonal branch. Mid LAD. There is also a small 20% lesion approximately 20 mm past the initial lesion.   . Mid LAD lesion, 85% stenosed. Discrete. **The mid LAD was successfully stented using a 3.5 x 20 mm Promus drug-eluting stent.      Left Circumflex  Vessel was injected. Vessel is normal in caliber. Vessel is angiographically normal.      Right Coronary Artery  Vessel was injected. Vessel is normal in caliber. Vessel is angiographically normal.     Left Ventricle The left ventricular size is normal. The  ejection fraction is calculated to be 60%.  _____________   History of Present Illness     70 y/o ? with a h/o HTN, HL, and GERD.  In late March, he underwent left total hip arthroplasty and complained of chest pain post-operatively.  He ruled out for MI and had no recurrent symptoms.  He was subsequently discharged and arrangements were made for outpatient stress testing.  This was performed on 07/17/2015, and revealed mild ischemia in the basal inferoseptal, mid inferoseptal, and apical inferior walls.  EF was 53%.  Given this finding, patient was seen back in clinic and arrangements were made for diagnostic catheterization.  Hospital Course     Consultants: None   [name] presented to the [facility] catheterization laboratory on 08/08/2015 and underwent diagnostic catheterization revealing an 85% stenosis in the mid LAD with otherwise normal coronary arteries and normal LV function.  Films were reviewed with the interventional team and he subsequently underwent successful PCI and drug eluting stent placement to the mid LAD, with placement of a 3.5 x 20 mm Promus drug-eluting stent.  [name] tolerated the procedure well and post-procedure, he has been ambulating without recurrent symptoms or limitations.  His labs are stable, though notable for normocytic anemia.  He will be discharged home today in good condition. _____________  Discharge Vitals Blood pressure 105/58, pulse 61, temperature 97.7 F (36.5 C), temperature source Oral, resp. rate 15,  height 5\' 7"  (1.702 m), weight 161 lb 9.6 oz (73.3 kg), SpO2 96 %.  Filed Weights   08/08/15 [id number] 08/09/15 0601  Weight: 160 lb (72.576 kg) 161 lb 9.6 oz (73.3 kg)   Intake/Output Summary (Last 24 hours) at 08/09/15 0844 Last data filed at 08/09/15 0601  Gross per 24 hour  Intake 563.65 ml  Output   2850 ml  Net -2286.35 ml   Filed Weights   08/08/15 0632 08/09/15 0601  Weight: 160 lb (72.576 kg) 161 lb 9.6 oz (73.3 kg)   Physical Exam     General: Pleasant, NAD. Neuro: Alert and oriented X 3. Moves all extremities spontaneously. Psych: Normal affect. HEENT:  Normal  Neck: Supple without bruits or JVD. Lungs:  Resp regular and unlabored, CTA. Heart: RRR no s3, s4, or murmurs. Abdomen: Soft, non-tender, non-distended, BS + x 4.  Extremities: No clubbing, cyanosis or edema. DP/PT/Radials 2+ and equal bilaterally.  The right radial cath site is mildly ecchymotic without bleeding, bruit, or hematoma.   Labs & Radiologic Studies    CBC  Recent Labs  08/08/15 1045 08/09/15 0510  WBC 6.2 7.8  HGB 10.9* 10.5*  HCT 35.9* 33.9*  MCV 81.6 81.7  PLT 329 [id number]   Basic Metabolic Panel  Recent Labs  08/08/15 1045 08/09/15 0510  NA  --  137  K  --  3.9  CL  --  104  CO2  --  28  GLUCOSE  --  96  BUN  --  9  CREATININE 0.70 0.78  CALCIUM  --  8.6*  _____________   Disposition   Pt is being discharged home today in good condition.  Follow-up Plans & Appointments    Follow-up Information    Follow up with Lyda Jester, PA-C On 08/27/2015.   Specialties:  Cardiology, Radiology   Why:  2:00 PM - Dr. Marlou Porch PA   Contact information:   [place] [address] [phone]        Discharge Medications   Current Discharge Medication List    START taking these medications   Details  clopidogrel (PLAVIX) 75 MG tablet Take 1 tablet (75 mg total) by mouth daily with breakfast. Qty: 30 tablet, Refills: 6    nitroGLYCERIN (NITROSTAT) 0.4 MG SL tablet Place 1 tablet (0.4 mg total) under the tongue every 5 (five) minutes as needed for chest pain. Qty: 25 tablet, Refills: 3    pantoprazole (PROTONIX) 40 MG tablet Take 1 tablet (40 mg total) by mouth daily at 6 (six) AM. Qty: 30 tablet, Refills: 6    rosuvastatin (CRESTOR) 20 MG tablet Take 1 tablet (20 mg total) by mouth daily at 6 PM. Qty: 30 tablet, Refills: 6      CONTINUE these medications which have NOT CHANGED   Details    Alcaftadine (LASTACAFT) 0.25 % SOLN Place 1 drop into both eyes daily as needed (pain).    amLODipine (NORVASC) 10 MG tablet Take 10 mg by mouth daily after lunch.     aspirin EC 81 MG tablet Take 81 mg by mouth daily after lunch.     bacitracin-polymyxin b (POLYSPORIN) ointment Apply 1 application topically daily as needed (wound care/ bug bites).    diazepam (VALIUM) 2 MG tablet Take 2 tablets (4 mg total) by mouth at bedtime as needed (insomnia). Qty: 180 tablet, Refills: 1    rOPINIRole (REQUIP) 0.5 MG tablet Take 0.25-0.5 mg by mouth at bedtime as needed (restless  leg).     SIMPLY SALINE NA Place 1 spray into both nostrils daily.    traMADol (ULTRAM) 50 MG tablet Take 1-2 tablets (50-100 mg total) by mouth every 6 (six) hours as needed (mild to moderate pain). Qty: 80 tablet, Refills: 1    hydrocortisone cream 1 % Apply 1 application topically daily as needed for itching.    Simethicone (GAS-X ULTRA STRENGTH) 180 MG CAPS Take 180 mg by mouth daily as needed (flatulence).      STOP taking these medications     celecoxib (CELEBREX) 200 MG capsule      omeprazole (PRILOSEC) 20 MG capsule      amoxicillin (AMOXIL) 500 MG capsule      methocarbamol (ROBAXIN) 500 MG tablet           Outstanding Labs/Studies   Follow-up lipids and lft's in 6-8 wks (new statin)  Duration of Discharge Encounter   Greater than 30 minutes including physician time.  Signed, Murray Hodgkins NP 08/09/2015, 8:39 AM    Personally seen and examined. Agree with above.  No CP, SOB. Walking hall no issues. Ready to go.  Crestor 20, new start Watch for myalgias  DAPT 1 year Mid LAD stent  Candee Furbish, MD

## 2015-08-13 ENCOUNTER — Other Ambulatory Visit: Payer: Self-pay | Admitting: Family Medicine

## 2015-08-24 ENCOUNTER — Ambulatory Visit (INDEPENDENT_AMBULATORY_CARE_PROVIDER_SITE_OTHER): Payer: Medicare Other | Admitting: Cardiology

## 2015-08-24 ENCOUNTER — Telehealth: Payer: Self-pay | Admitting: Family Medicine

## 2015-08-24 ENCOUNTER — Encounter: Payer: Self-pay | Admitting: Cardiology

## 2015-08-24 VITALS — BP 110/80 | HR 68 | Ht 67.0 in | Wt 161.2 lb

## 2015-08-24 DIAGNOSIS — E785 Hyperlipidemia, unspecified: Secondary | ICD-10-CM | POA: Diagnosis not present

## 2015-08-24 DIAGNOSIS — R9439 Abnormal result of other cardiovascular function study: Secondary | ICD-10-CM

## 2015-08-24 DIAGNOSIS — I2510 Atherosclerotic heart disease of native coronary artery without angina pectoris: Secondary | ICD-10-CM

## 2015-08-24 DIAGNOSIS — Z95820 Peripheral vascular angioplasty status with implants and grafts: Secondary | ICD-10-CM

## 2015-08-24 DIAGNOSIS — K219 Gastro-esophageal reflux disease without esophagitis: Secondary | ICD-10-CM

## 2015-08-24 DIAGNOSIS — Z959 Presence of cardiac and vascular implant and graft, unspecified: Secondary | ICD-10-CM

## 2015-08-24 NOTE — Telephone Encounter (Signed)
Pt dropped off envelope with prescription changes per Dr. Marlou Porch. He has discussed with Dr. [name] prior. He needs them ordered thru [facility] [employer]. I placed forms on Rena's desk.

## 2015-08-24 NOTE — Patient Instructions (Addendum)
Medication Instructions:   Your physician recommends that you continue on your current medications as directed. Please refer to the Current Medication list given to you today.   If you need a refill on your cardiac medications before your next appointment, please call your pharmacy.  Labwork:  RETURN ON October 05 2015  FASTING LIPID AND LFT    Testing/Procedures:  NONE ORDER TODAY    Follow-Up:  3 MONTHS WITH DR Marlou Porch   Any Other Special Instructions Will Be Listed Below (If Applicable).

## 2015-08-24 NOTE — Progress Notes (Signed)
Cardiology Office Note   Date:  08/24/2015   ID:  Severino, Kuklinski 06-19-1945, MRN [id number]  PCP:  Elsie Stain, MD  Cardiologist:  Dr. Marlou Porch    Chief Complaint  Patient presents with  . Hospitalization Follow-up    no pain      History of Present Illness: [name] is a 70 y.o. male who presents for post hospitalization 08/08/15/ to 08/09/15 for cardiac cath and stent after + nuc study, with chest pain post op in March.   He has a h/o HTN, HL, and GERD. In late March, he underwent left total hip arthroplasty and complained of chest pain post-operatively. He ruled out for MI and had no recurrent symptoms. He was subsequently discharged and arrangements were made for outpatient stress testing. This was performed on 07/17/2015, and revealed mild ischemia in the basal inferoseptal, mid inferoseptal, and apical inferior walls. EF was 53%. Given this finding, patient was seen back in clinic and arrangements were made for diagnostic catheterization.    Cath with 90% LAD stenosis and underwent stent to LAD DES. He did well post procedure and was discharged.  He was seen by cardiac rehab but prefers not to do, he is very active.  His diet is healthy.  He is doing well without chest pain or SOB.  He is taking his meds as instructed and now on protonix instead of prilosec.   Past Medical History  Diagnosis Date  . Syncope     negative cardiac w/u per Dr Irish Lack  . OA (osteoarthritis)     with r hip replacement  . Nephrolithiasis   . Tick bite 2011    likely tick associated illness, treated with doxy with resolution,  . GERD (gastroesophageal reflux disease)   . Viral exanthem, unspecified   . Osteoarthrosis involving, or with mention of more than one site, but not specified as generalized, site unspecified(715.80)   . History of BPH   . Hyperlipidemia   . Hypertension   . Adjustment reaction with physical symptoms   . Elevated PSA     h/o with normal bx per URO  . Cancer,  skin, squamous cell     R shoulder, removed per derm 2012  . Coronary artery disease     a. 05/2015 c/p post-op surgery; b. 07/2015 MV EF 53%, basal inferoseptal, mid inferoseptal, and apical inferior mild ischemia;  c. 08/2015 Cath/PCI: LM nl, LAD 90m (3.5x20 Promus DES), 71m, LCX nl, RCA nl, EF 60%.    Past Surgical History  Procedure Laterality Date  . Total hip arthroplasty      right  . Achilles tendon repair  1984  . Shoulder arthroscopy  2012    R rotator cuff (biceps tear not repaired)  . Skin cancer excision      right shoulder  . Total hip arthroplasty Left 05/02/2015    Procedure: TOTAL LEFT HIP ARTHROPLASTY ANTERIOR APPROACH;  Surgeon: Gaynelle Arabian, MD;  Location: [facility];  Service: Orthopedics;  Laterality: Left;  . Coronary stent placement  08/08/2015    MID LAD WITH DES  . Cardiac catheterization N/A 08/08/2015    Procedure: Left Heart Cath and Coronary Angiography;  Surgeon: Jerline Pain, MD;  Location: [facility];  Service: Cardiovascular;  Laterality: N/A;  . Cardiac catheterization N/A 08/08/2015    Procedure: Coronary Stent Intervention;  Surgeon: Jettie Booze, MD;  Location: [facility];  Service: Cardiovascular;  Laterality: N/A;  . Joint replacement  Current Outpatient Prescriptions  Medication Sig Dispense Refill  . Alcaftadine (LASTACAFT) 0.25 % SOLN Place 1 drop into both eyes daily as needed (pain).    Marland Kitchen amLODipine (NORVASC) 10 MG tablet Take 10 mg by mouth daily after lunch.     Marland Kitchen aspirin EC 81 MG tablet Take 81 mg by mouth daily after lunch.     . bacitracin-polymyxin b (POLYSPORIN) ointment Apply 1 application topically daily as needed (wound care/ bug bites).    . clopidogrel (PLAVIX) 75 MG tablet Take 1 tablet (75 mg total) by mouth daily with breakfast. 30 tablet 6  . diazepam (VALIUM) 2 MG tablet Take 1 mg by mouth at bedtime as needed for anxiety (INSOMNIA).    . hydrocortisone cream 1 % Apply 1 application topically daily as  needed for itching.    . nitroGLYCERIN (NITROSTAT) 0.4 MG SL tablet Place 1 tablet (0.4 mg total) under the tongue every 5 (five) minutes as needed for chest pain. 25 tablet 3  . omeprazole (PRILOSEC) 20 MG capsule Take 1 capsule by mouth  daily 90 capsule 1  . pantoprazole (PROTONIX) 40 MG tablet Take 1 tablet (40 mg total) by mouth daily at 6 (six) AM. 30 tablet 6  . rOPINIRole (REQUIP) 0.5 MG tablet Take 0.25-0.5 mg by mouth at bedtime as needed (restless leg).     . rosuvastatin (CRESTOR) 20 MG tablet Take 1 tablet (20 mg total) by mouth daily at 6 PM. 30 tablet 6  . Simethicone (GAS-X ULTRA STRENGTH) 180 MG CAPS Take 180 mg by mouth daily as needed (flatulence).    . SIMPLY SALINE NA Place 1 spray into both nostrils daily.    . traMADol (ULTRAM) 50 MG tablet Take 50 mg by mouth every 6 (six) hours as needed for moderate pain or severe pain. PT. TAKES UP TO 6 TABS DAILY     No current facility-administered medications for this visit.    Allergies:   Xarelto; Alprazolam; Doxycycline; Hydrocodone; Ibuprofen; Lipitor; Tamsulosin; and Zolpidem tartrate    Social History:  The patient  reports that he quit smoking about 31 years ago. His smoking use included Cigarettes. He has never used smokeless tobacco. He reports that he does not drink alcohol or use illicit drugs.   Family History:  The patient's family history includes Arrhythmia in his mother; Prostate cancer in his maternal uncle. There is no history of Colon cancer, Heart attack, or Hypertension.    ROS:  General:no colds or fevers, no weight changes Skin:no rashes or ulcers HEENT:no blurred vision, no congestion CV:see HPI PUL:see HPI GI:no diarrhea constipation or melena, no indigestion GU:no hematuria, no dysuria MS:no joint pain, no claudication Neuro:no syncope, no lightheadedness Endo:no diabetes, no thyroid disease Wt Readings from Last 3 Encounters:  08/24/15 161 lb 3.2 oz (73.12 kg)  08/09/15 161 lb 9.6 oz (73.3 kg)    08/01/15 160 lb (72.576 kg)     PHYSICAL EXAM: VS:  BP 110/80 mmHg  Pulse 68  Ht 5\' 7"  (1.702 m)  Wt 161 lb 3.2 oz (73.12 kg)  BMI 25.24 kg/m2 , BMI Body mass index is 25.24 kg/(m^2). General:Pleasant affect, NAD Skin:Warm and dry, brisk capillary refill HEENT:normocephalic, sclera clear, mucus membranes moist Neck:supple, no JVD, no bruits  Heart:S1S2 RRR without murmur, gallup, rub or click Lungs:clear without rales, rhonchi, or wheezes [id number], non tender, + BS, do not palpate liver spleen or masses Ext:no lower ext edema, 2+ pedal pulses, 2+ radial pulses Neuro:alert and oriented, MAE, follows  commands, + facial symmetry    EKG:  EKG is ordered today. The ekg ordered today demonstrates SR with normal EKG and no changes from discharge.    Recent Labs: 06/17/2015: ALT 17 08/09/2015: BUN 9; Creatinine, Ser 0.78; Hemoglobin 10.5*; Platelets 308; Potassium 3.9; Sodium 137    Lipid Panel    Component Value Date/Time   CHOL 169 11/07/2014 1005   TRIG 99.0 11/07/2014 1005   HDL 44.30 11/07/2014 1005   CHOLHDL 4 11/07/2014 1005   VLDL 19.8 11/07/2014 1005   LDLCALC 105* 11/07/2014 1005   LDLDIRECT 135.6 09/10/2011 0835       Other studies Reviewed: Additional studies/ records that were reviewed today include: . Abnormal nuclear stress test - Inferior wall ischemia, possible RCA disease - Brief postoperative chest discomfort relieved with nitroglycerin in the hospital setting. Troponin was negative. I thought originally were that this was musculoskeletal. - Given his abnormal stress test findings, it would not be unreasonable to pursue diagnostic cardiac catheterization. This will be mainly to exclude the possibility of triple-vessel disease would portend a decrease mortality if revascularized. If there is a lesion in place, we would have to come to complete further medical management  CARDIAC CATH:  08/08/15 Conclusion     Mid LAD lesion, 85% stenosed. 20% lesion  approximate 20 mm past focal mid LAD lesion as described above.  Normal left ventricular function, EF 60% no wall motion abnormality  No aortic stenosis  Normal appearing coronary arteries otherwise.    CORONARY STENT INTERVENTION: Conclusion     Mid LAD-1 lesion, 90% stenosed. Post intervention with a 3.5 x 20 Promus drug-eluting stent, postdilated to 3.6 mm, there is a 0% residual stenosis.  Continue dual antiplatelet therapy for at least a year. At that point, consider stopping aspirin. Continue aggressive secondary prevention. He'll be watched overnight. Possible discharge tomorrow.     ASSESSMENT AND PLAN:  1.   Abnormal stress test s/p PCI and drug-eluting stent placement of the LAD            Doing well today, no pain or SOB. On Plavix and ASA           He will follow up with Dr. Marlou Porch in 3 months.  2.   CAD (coronary artery disease)  3.   Essential hypertension well controlled  4.  HLD (hyperlipidemia) on Crestor will recheck lipids in 6 weeks.    5.  GERD- now on Protonix due to interaction with plavix and prilosec.  Current medicines are reviewed with the patient today.  The patient Has no concerns regarding medicines.  The following changes have been made:  See above Labs/ tests ordered today include:see above  Disposition:   FU:  see above  Signed, Cecilie Kicks, NP  08/24/2015 10:27 AM    [address], [place], [place], [place] Phone: [phone]; Fax: [phone]

## 2015-08-27 ENCOUNTER — Ambulatory Visit: Payer: Medicare Other | Admitting: Cardiology

## 2015-08-28 NOTE — Telephone Encounter (Addendum)
Left v/m requesting cb; refills requested were last done by cardiology. Pt called back and will ck with cardiology upon his return home next week; pt is not out of meds.

## 2015-09-05 ENCOUNTER — Other Ambulatory Visit: Payer: Self-pay

## 2015-09-06 ENCOUNTER — Telehealth (HOSPITAL_COMMUNITY): Payer: Self-pay | Admitting: Cardiac Rehabilitation

## 2015-09-06 NOTE — Telephone Encounter (Signed)
pc to pt to discuss enrolling in cardiac rehab. Pt declined, exercising on their own 

## 2015-09-07 ENCOUNTER — Other Ambulatory Visit: Payer: Self-pay | Admitting: *Deleted

## 2015-09-09 ENCOUNTER — Other Ambulatory Visit: Payer: Self-pay | Admitting: Family Medicine

## 2015-09-10 NOTE — Telephone Encounter (Signed)
Electronic refill request.  Prescribed by cards.  Please advise.

## 2015-09-11 ENCOUNTER — Telehealth: Payer: Self-pay

## 2015-09-11 NOTE — Telephone Encounter (Signed)
Sent. Thanks.   

## 2015-09-11 NOTE — Telephone Encounter (Signed)
Patient advised.

## 2015-09-11 NOTE — Telephone Encounter (Signed)
Pt left v/m;pt had heart cath about 5 wks ago;some of pts meds were changed;pt was doing fine when taking celebrex and prilosec; cardiology told pt to stop celebrex and prilosec; pt was started on Crestor, plavix and pantoprazole. pt is feeling paranoid; pt is even suspicious of his wife.pt having bad dreams. Pt does not know who [facility] Bayley NP is; pt wants to know if having reaction to crestor; pt could not take Lipitor in the past and thinks he does not do well taking statins;pt last seen 01/03/15;no future visit scheduled. pt wants to here from Dr [name] for direction; pt has not called cardiology. Pt has stopped taking the new meds, crestor,plavix and pantoprazole and when pt does not take the meds he feels better. pts wife told pt he cannot just stop med. Pt request cb.

## 2015-09-11 NOTE — Telephone Encounter (Signed)
Please call pt/wife.  I think the crestor may be the issue.   Pantoprazole and plavix are less likely to cause the problems.   I would do one thing at a time, since he feels better off all three.    I would try going back on the plavix first.   If tolerated, then add back the pantoprazole.   Update me as needed.  Thanks.

## 2015-09-15 ENCOUNTER — Telehealth: Payer: Self-pay | Admitting: Family Medicine

## 2015-09-15 NOTE — Telephone Encounter (Signed)
LM for pt to sch AWV and CPE, mn °

## 2015-10-05 ENCOUNTER — Other Ambulatory Visit: Payer: Medicare Other | Admitting: *Deleted

## 2015-10-05 ENCOUNTER — Other Ambulatory Visit: Payer: Self-pay | Admitting: *Deleted

## 2015-10-05 DIAGNOSIS — E785 Hyperlipidemia, unspecified: Secondary | ICD-10-CM

## 2015-10-05 LAB — HEPATIC FUNCTION PANEL
ALT: 9 U/L (ref 9–46)
AST: 16 U/L (ref 10–35)
Albumin: 4.2 g/dL (ref 3.6–5.1)
Alkaline Phosphatase: 70 U/L (ref 40–115)
BILIRUBIN DIRECT: 0.1 mg/dL (ref <=0.2)
BILIRUBIN TOTAL: 0.5 mg/dL (ref 0.2–1.2)
Indirect Bilirubin: 0.4 mg/dL (ref 0.2–1.2)
Total Protein: 6.7 g/dL (ref 6.1–8.1)

## 2015-10-05 LAB — LIPID PANEL
CHOLESTEROL: 172 mg/dL (ref 125–200)
HDL: 47 mg/dL (ref >=40)
LDL Cholesterol: 99 mg/dL (ref <130)
Total CHOL/HDL Ratio: 3.7 Ratio (ref <=5.0)
Triglycerides: 128 mg/dL (ref <150)
VLDL: 26 mg/dL (ref <30)

## 2015-10-05 NOTE — Telephone Encounter (Signed)
Faxed refill request from mail order pharmacy (? 90 day supply).  Last Filled:   Historical Med.  Please advise.

## 2015-10-07 MED ORDER — DIAZEPAM 2 MG PO TABS: 1.0000 mg | ORAL_TABLET | Freq: Every evening | ORAL | 1 refills | Status: DC | PRN

## 2015-10-07 NOTE — Telephone Encounter (Signed)
Printed.  Thanks.  

## 2015-10-08 NOTE — Telephone Encounter (Signed)
Patient advised.  Rx left at front desk for pick up. 

## 2015-10-09 ENCOUNTER — Other Ambulatory Visit: Payer: Self-pay | Admitting: *Deleted

## 2015-10-19 ENCOUNTER — Telehealth: Payer: Self-pay | Admitting: Cardiology

## 2015-10-19 NOTE — Telephone Encounter (Signed)
Lm to call back re: labs; results in [facility] box.

## 2015-10-19 NOTE — Telephone Encounter (Signed)
New Message  Pt voiced he's calling to receive lab results.  Please follow up with pt. Thanks!

## 2015-10-22 NOTE — Telephone Encounter (Signed)
Follow up  ° ° °Patient returning call back to nurse  °

## 2015-10-22 NOTE — Telephone Encounter (Signed)
Called pt lvm .

## 2015-10-23 NOTE — Telephone Encounter (Signed)
Returned pts call and discussed his lab results. Pt verbalized understanding. 

## 2015-11-14 ENCOUNTER — Other Ambulatory Visit: Payer: Self-pay | Admitting: *Deleted

## 2015-11-14 MED ORDER — CLOPIDOGREL BISULFATE 75 MG PO TABS: 75.0000 mg | ORAL_TABLET | Freq: Every day | ORAL | 1 refills | Status: DC

## 2015-11-14 MED ORDER — PANTOPRAZOLE SODIUM 40 MG PO TBEC: 40.0000 mg | DELAYED_RELEASE_TABLET | Freq: Every day | ORAL | 1 refills | Status: DC

## 2015-11-19 ENCOUNTER — Other Ambulatory Visit: Payer: Self-pay | Admitting: *Deleted

## 2015-11-19 ENCOUNTER — Telehealth: Payer: Self-pay

## 2015-11-19 MED ORDER — DIAZEPAM 5 MG PO TABS: 2.5000 mg | ORAL_TABLET | Freq: Two times a day (BID) | ORAL | 0 refills | Status: DC | PRN

## 2015-11-19 NOTE — Telephone Encounter (Signed)
Please call in new rx as a short term bridge.  Update [place] as needed.  Use the lowest dose possible, okay to cut pills in half.  Thanks.

## 2015-11-19 NOTE — Telephone Encounter (Signed)
Patient states he takes Diazepam 2 mg tablet.  He takes 2 tablets at bedtime (around 9 pm) and another 2 tablets at 3 am when he wakes up and can't go back to sleep.

## 2015-11-19 NOTE — Telephone Encounter (Signed)
Medication phoned to pharmacy.  

## 2015-11-19 NOTE — Telephone Encounter (Signed)
Pt left another v/m that pt has CPX scheduled 12/07/15. Pt hopes Dr [name] can help pt get some rest prior to physical.pt request cb.

## 2015-11-19 NOTE — Telephone Encounter (Signed)
Please clarify the dosing of the qhs diazepam- 2-4 tabs or 2-4 mg at night?  Let me know.  We should continue that, not change to temazepam.   Let me know so we can get a new rx set up.  Thanks.

## 2015-11-19 NOTE — Telephone Encounter (Signed)
Pt left /vm; after stopping the crestor the paranoia has cleared. pts daughter is getting married on 11/23/15 to someone pt does not care for and pt needs med to help him sleep.  Too early to fill diazepam per pharmacy; pt is taking 2 - 4 diazepam at night. Med list has take 1/2 tab at hs as needed. Pt has 4 pills of temazepam 15 mg that was pts father's med.pt wants to know if can take temazepam to help pt sleep. Pt request cb.

## 2015-11-20 ENCOUNTER — Encounter: Payer: Self-pay | Admitting: Family Medicine

## 2015-11-25 ENCOUNTER — Encounter: Payer: Self-pay | Admitting: Family Medicine

## 2015-11-26 ENCOUNTER — Encounter: Payer: Self-pay | Admitting: Family Medicine

## 2015-11-26 ENCOUNTER — Encounter: Payer: Self-pay | Admitting: *Deleted

## 2015-11-30 ENCOUNTER — Ambulatory Visit (INDEPENDENT_AMBULATORY_CARE_PROVIDER_SITE_OTHER): Payer: Medicare Other | Admitting: Cardiology

## 2015-11-30 ENCOUNTER — Other Ambulatory Visit: Payer: Self-pay | Admitting: Family Medicine

## 2015-11-30 ENCOUNTER — Encounter: Payer: Self-pay | Admitting: Cardiology

## 2015-11-30 VITALS — BP 124/68 | HR 88 | Ht 67.0 in | Wt 158.2 lb

## 2015-11-30 DIAGNOSIS — Z959 Presence of cardiac and vascular implant and graft, unspecified: Secondary | ICD-10-CM

## 2015-11-30 DIAGNOSIS — Z889 Allergy status to unspecified drugs, medicaments and biological substances status: Secondary | ICD-10-CM

## 2015-11-30 DIAGNOSIS — I2510 Atherosclerotic heart disease of native coronary artery without angina pectoris: Secondary | ICD-10-CM

## 2015-11-30 DIAGNOSIS — Z95820 Peripheral vascular angioplasty status with implants and grafts: Secondary | ICD-10-CM

## 2015-11-30 DIAGNOSIS — I10 Essential (primary) hypertension: Secondary | ICD-10-CM

## 2015-11-30 DIAGNOSIS — Z125 Encounter for screening for malignant neoplasm of prostate: Secondary | ICD-10-CM

## 2015-11-30 DIAGNOSIS — E785 Hyperlipidemia, unspecified: Secondary | ICD-10-CM | POA: Diagnosis not present

## 2015-11-30 DIAGNOSIS — Z789 Other specified health status: Secondary | ICD-10-CM

## 2015-11-30 MED ORDER — EZETIMIBE 10 MG PO TABS: 10.0000 mg | ORAL_TABLET | Freq: Every day | ORAL | 11 refills | Status: DC

## 2015-11-30 MED ORDER — PANTOPRAZOLE SODIUM 40 MG PO TBEC: 40.0000 mg | DELAYED_RELEASE_TABLET | Freq: Every day | ORAL | 1 refills | Status: DC

## 2015-11-30 NOTE — Progress Notes (Signed)
Cardiology Office Note    Date:  11/30/2015   ID:  Keith, Fritz November 07, 1945, MRN [id number]  PCP:  Keith Stain, MD  Cardiologist:   Keith Furbish, MD     History of Present Illness:  [name] is a 70 y.o. male with coronary artery disease status post LAD drug-eluting stent placement on 08/08/15 after abnormal stress test here for follow-up. Also has essential hypertension, hyperlipidemia, GERD.  Interestingly, in review of phone note from 09/11/15, about 5 weeks after cardiac catheterization he was feeling paranoid and suspicious. He was worried that he may be having a reaction to Crestor. He stopped taking Crestor, Plavix, pen Toprol resolved and when he does not take the medications he feels better. He was instructed to go back on the Plavix by [name].  On 11/20/15 he emailed [name] stating that emotionally he was not doing very well.  Since stopping the Crestor, he is feeling much better. No chest Fritz, no shortness of breath, no fevers, no chills. Unfortunately his wife broke her wrist. [name].  Past Medical History:  Diagnosis Date  . Adjustment reaction with physical symptoms   . Cancer, skin, squamous cell    R shoulder, removed per derm 2012  . Coronary artery disease    a. 05/2015 c/p post-op surgery; b. 07/2015 MV EF 53%, basal inferoseptal, mid inferoseptal, and apical inferior mild ischemia;  c. 08/2015 Cath/PCI: LM nl, LAD 64m (3.5x20 Promus DES), 32m, LCX nl, RCA nl, EF 60%.  . Elevated PSA    h/o with normal bx per [facility]RO  . GERD (gastroesophageal reflux disease)   . History of BPH   . Hyperlipidemia   . Hypertension   . Nephrolithiasis   . OA (osteoarthritis)    with r hip replacement  . Osteoarthrosis involving, or with mention of more than one site, but not specified as generalized, site unspecified(715.80)   . Syncope    negative cardiac w/u per [name]  . Tick bite 2011   likely tick associated illness, treated with doxy with resolution,  .  Viral exanthem, unspecified     Past Surgical History:  Procedure Laterality Date  . ACHILLES TENDON REPAIR  1984  . CARDIAC CATHETERIZATION N/A 08/08/2015   Procedure: Left Heart Cath and Coronary Angiography;  Surgeon: Keith Pain, MD;  Location: [facility];  Service: Cardiovascular;  Laterality: N/A;  . CARDIAC CATHETERIZATION N/A 08/08/2015   Procedure: Coronary Stent Intervention;  Surgeon: Keith Booze, MD;  Location: [facility];  Service: Cardiovascular;  Laterality: N/A;  . CORONARY STENT PLACEMENT  08/08/2015   MID LAD WITH DES  . JOINT REPLACEMENT    . SHOULDER ARTHROSCOPY  2012   R rotator cuff (biceps tear not repaired)  . SKIN CANCER EXCISION     right shoulder  . TOTAL HIP ARTHROPLASTY     right  . TOTAL HIP ARTHROPLASTY Left 05/02/2015   Procedure: TOTAL LEFT HIP ARTHROPLASTY ANTERIOR APPROACH;  Surgeon: Keith Arabian, MD;  Location: [facility];  Service: Orthopedics;  Laterality: Left;    Current Medications: Outpatient Medications Prior to Visit  Medication Sig Dispense Refill  . Alcaftadine (LASTACAFT) 0.25 % SOLN Place 1 drop into both eyes daily as needed (Fritz).    Marland Kitchen amLODipine (NORVASC) 10 MG tablet Take 10 mg by mouth daily after lunch.     Marland Kitchen aspirin EC 81 MG tablet Take 81 mg by mouth daily after lunch.     Marland Kitchen  bacitracin-polymyxin b (POLYSPORIN) ointment Apply 1 application topically daily as needed (wound care/ bug bites).    . clopidogrel (PLAVIX) 75 MG tablet Take 1 tablet (75 mg total) by mouth daily with breakfast. 90 tablet 1  . diazepam (VALIUM) 5 MG tablet Take 0.5-1 tablets (2.5-5 mg total) by mouth 2 (two) times daily as needed for anxiety (INSOMNIA). 30 tablet 0  . hydrocortisone cream 1 % Apply 1 application topically daily as needed for itching.    . nitroGLYCERIN (NITROSTAT) 0.4 MG SL tablet Place 1 tablet (0.4 mg total) under the tongue every 5 (five) minutes as needed for chest Fritz. 25 tablet 3  . rOPINIRole (REQUIP) 0.5 MG  tablet Take 0.25-0.5 mg by mouth at bedtime as needed (restless leg).     . Simethicone (GAS-X ULTRA STRENGTH) 180 MG CAPS Take 180 mg by mouth daily as needed (flatulence).    . SIMPLY SALINE NA Place 1 spray into both nostrils daily.    . traMADol (ULTRAM) 50 MG tablet Take 50 mg by mouth every 6 (six) hours as needed for moderate Fritz or severe Fritz. PT. TAKES UP TO 6 TABS DAILY    . omeprazole (PRILOSEC) 20 MG capsule Take 1 capsule by mouth  daily 90 capsule 1  . pantoprazole (PROTONIX) 40 MG tablet Take 1 tablet (40 mg total) by mouth daily at 6 (six) AM. 90 tablet 1  . amLODipine (NORVASC) 10 MG tablet Take 1 tablet by mouth  daily 90 tablet 3  . rosuvastatin (CRESTOR) 20 MG tablet Take 1 tablet (20 mg total) by mouth daily at 6 PM. 30 tablet 6   No facility-administered medications prior to visit.      Allergies:   Xarelto [rivaroxaban]; Alprazolam; Doxycycline; Hydrocodone; Ibuprofen; Lipitor [atorvastatin]; Tamsulosin; Zolpidem tartrate; and Crestor [rosuvastatin calcium]   Social History   Social History  . Marital status: Married    Spouse name: N/A  . Number of children: N/A  . Years of education: N/A   Occupational History  . 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 Smoking status: Former Smoker    Types: Cigarettes    Quit date: 04/12/1984  . Smokeless tobacco: Never Used  . Alcohol use No  . Drug use: No  . Sexual activity: Not Asked   Other Topics Concern  . 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     His youngest daughter is dead     Family History:  The patient's family history includes Arrhythmia in his mother; Prostate cancer in his maternal uncle.   ROS:   Please see the history of  present illness.    ROS All other systems reviewed and are negative.   PHYSICAL EXAM:   VS:  BP 124/68   Pulse 88   Ht 5\' 7"  (1.702 m)   Wt 158 lb 3.2 oz (71.8 kg)   BMI 24.78 kg/m    GEN: Well nourished, well developed, in no acute distress  HEENT: normal  Neck: no JVD, carotid bruits, or masses Cardiac: RRR; no murmurs, rubs, or gallops,no edema  Respiratory:  clear to auscultation bilaterally, normal work of breathing GI: soft, nontender, nondistended, + BS MS:  no deformity or atrophy  Skin: warm and dry, no rash Neuro:  Alert and Oriented x 3, Strength and sensation are intact Psych: euthymic mood, full affect  Wt Readings from Last 3 Encounters:  11/30/15 158 lb 3.2 oz (71.8 kg)  08/24/15 161 lb 3.2 oz (73.1 kg)  08/09/15 161 lb 9.6 oz (73.3 kg)      Studies/Labs Reviewed:   EKG:  None today  Recent Labs: 08/09/2015: BUN 9; Creatinine, Ser 0.78; Hemoglobin 10.5; Platelets 308; Potassium 3.9; Sodium 137 10/05/2015: ALT 9   Lipid Panel    Component Value Date/Time   CHOL 172 10/05/2015 1101   TRIG 128 10/05/2015 1101   HDL 47 10/05/2015 1101   CHOLHDL 3.7 10/05/2015 1101   VLDL 26 10/05/2015 1101   LDLCALC 99 10/05/2015 1101   LDLDIRECT 135.6 09/10/2011 0835    Additional studies/ records that were reviewed today include:  Hospital notes, cardiac catheterization, EKGs reviewed.    ASSESSMENT:    1. CAD in native artery   2. S/P angioplasty with stent   3. Hyperlipidemia   4. Statin intolerance      PLAN:  In order of problems listed above:  Coronary artery disease  - PCI LAD DES 08/08/15 (will be able to stop Plavix at one year anniversary).  - Doing well, no anginal symptoms.  - There he appreciative of Keith. Irish Fritz and [facility].   Essential hypertension  - Excellent control medications reviewed  Hyperlipidemia  - Statin intolerance, Crestor, pravastatin, Lipitor. Paranoia.  - We will try Zetia 10 mg once a day. Recheck lipid panel in 2 months.  ALT.  GERD  - Protonix secondary to Plavix.     Medication Adjustments/Labs and Tests Ordered: Current medicines are reviewed at length with the patient today.  Concerns regarding medicines are outlined above.  Medication changes, Labs and Tests ordered today are listed in the Patient Instructions below. Patient Instructions  Medication Instructions:  Please start Zetia 10 mg a day. Continue all other medications as listed.  Labwork: Please have blood work in 2 months (Lipid/ALT)  Follow-Up: Follow up in 6 months with Keith. Marlou Porch.  You will receive a letter in the mail 2 months before you are due.  Please call us when you receive this letter to schedule your follow up appointment.  If you need a refill on your cardiac medications before your next appointment, please call your pharmacy.  Thank you for choosing [facility]!!        Signed, Keith Furbish, MD  11/30/2015 2:56 PM    [address], [place], [place]  [address] Phone: [phone]; Fax: [phone]

## 2015-11-30 NOTE — Patient Instructions (Signed)
Medication Instructions:  Please start Zetia 10 mg a day. Continue all other medications as listed.  Labwork: Please have blood work in 2 months (Lipid/ALT)  Follow-Up: Follow up in 6 months with Dr. Marlou Porch.  You will receive a letter in the mail 2 months before you are due.  Please call us when you receive this letter to schedule your follow up appointment.  If you need a refill on your cardiac medications before your next appointment, please call your pharmacy.  Thank you for choosing [facility]!!

## 2015-12-03 ENCOUNTER — Other Ambulatory Visit (INDEPENDENT_AMBULATORY_CARE_PROVIDER_SITE_OTHER): Payer: Medicare Other

## 2015-12-03 ENCOUNTER — Ambulatory Visit: Payer: Medicare Other

## 2015-12-03 DIAGNOSIS — Z125 Encounter for screening for malignant neoplasm of prostate: Secondary | ICD-10-CM | POA: Diagnosis not present

## 2015-12-03 DIAGNOSIS — Z119 Encounter for screening for infectious and parasitic diseases, unspecified: Secondary | ICD-10-CM

## 2015-12-03 DIAGNOSIS — I10 Essential (primary) hypertension: Secondary | ICD-10-CM | POA: Diagnosis not present

## 2015-12-03 LAB — CBC WITH DIFFERENTIAL/PLATELET
BASOS PCT: 0.9 % (ref 0.0–3.0)
Basophils Absolute: 0.1 10*3/uL (ref 0.0–0.1)
EOS ABS: 0.2 10*3/uL (ref 0.0–0.7)
Eosinophils Relative: 2.9 % (ref 0.0–5.0)
HCT: 37.1 % — ABNORMAL LOW (ref 39.0–52.0)
HEMOGLOBIN: 12.1 g/dL — AB (ref 13.0–17.0)
LYMPHS ABS: 2 10*3/uL (ref 0.7–4.0)
Lymphocytes Relative: 33.5 % (ref 12.0–46.0)
MCHC: 32.7 g/dL (ref 30.0–36.0)
MCV: 79.4 fl (ref 78.0–100.0)
MONO ABS: 0.4 10*3/uL (ref 0.1–1.0)
Monocytes Relative: 7 % (ref 3.0–12.0)
NEUTROS PCT: 55.7 % (ref 43.0–77.0)
Neutro Abs: 3.4 10*3/uL (ref 1.4–7.7)
Platelets: 351 10*3/uL (ref 150.0–400.0)
RBC: 4.67 Mil/uL (ref 4.22–5.81)
RDW: 16.2 % — AB (ref 11.5–15.5)
WBC: 6.1 10*3/uL (ref 4.0–10.5)

## 2015-12-03 LAB — COMPREHENSIVE METABOLIC PANEL
ALBUMIN: 4 g/dL (ref 3.5–5.2)
ALT: 9 U/L (ref 0–53)
AST: 14 U/L (ref 0–37)
Alkaline Phosphatase: 65 U/L (ref 39–117)
BUN: 9 mg/dL (ref 6–23)
CHLORIDE: 102 meq/L (ref 96–112)
CO2: 31 mEq/L (ref 19–32)
CREATININE: 0.78 mg/dL (ref 0.40–1.50)
Calcium: 9 mg/dL (ref 8.4–10.5)
GFR: 104.45 mL/min (ref >60.00)
GLUCOSE: 83 mg/dL (ref 70–99)
Potassium: 3.9 mEq/L (ref 3.5–5.1)
SODIUM: 140 meq/L (ref 135–145)
Total Bilirubin: 0.4 mg/dL (ref 0.2–1.2)
Total Protein: 6.8 g/dL (ref 6.0–8.3)

## 2015-12-03 LAB — PSA, MEDICARE: PSA: 4.52 ng/mL — AB (ref 0.10–4.00)

## 2015-12-03 NOTE — Addendum Note (Signed)
Addended by: Ellamae Sia on: 12/03/2015 11:33 AM   Modules accepted: Orders

## 2015-12-04 LAB — HEPATITIS C ANTIBODY: HCV Ab: NEGATIVE

## 2015-12-07 ENCOUNTER — Ambulatory Visit: Payer: Medicare Other

## 2015-12-07 ENCOUNTER — Encounter: Payer: Self-pay | Admitting: Family Medicine

## 2015-12-07 ENCOUNTER — Ambulatory Visit (INDEPENDENT_AMBULATORY_CARE_PROVIDER_SITE_OTHER): Payer: Medicare Other | Admitting: Family Medicine

## 2015-12-07 VITALS — BP 130/78 | HR 75 | Temp 97.4°F | Ht 67.0 in | Wt 157.0 lb

## 2015-12-07 DIAGNOSIS — F4329 Adjustment disorder with other symptoms: Secondary | ICD-10-CM

## 2015-12-07 DIAGNOSIS — Z8042 Family history of malignant neoplasm of prostate: Secondary | ICD-10-CM | POA: Diagnosis not present

## 2015-12-07 DIAGNOSIS — I2510 Atherosclerotic heart disease of native coronary artery without angina pectoris: Secondary | ICD-10-CM

## 2015-12-07 DIAGNOSIS — I2583 Coronary atherosclerosis due to lipid rich plaque: Secondary | ICD-10-CM | POA: Diagnosis not present

## 2015-12-07 MED ORDER — DIAZEPAM 5 MG PO TABS: 2.5000 mg | ORAL_TABLET | Freq: Three times a day (TID) | ORAL | 1 refills | Status: DC | PRN

## 2015-12-07 MED ORDER — ESCITALOPRAM OXALATE 10 MG PO TABS: 10.0000 mg | ORAL_TABLET | Freq: Every day | ORAL | 5 refills | Status: DC

## 2015-12-07 NOTE — Patient Instructions (Signed)
Start lexapro, update me as needed.  I'd like to hear from you in about 1-2 weeks.  Take diazepam as needed, sedation caution.  Hold off on zetia for now.  We can address that later on.  We can reschedule your wellness visit when possible.  Take care.  Glad to see you.

## 2015-12-07 NOTE — Progress Notes (Signed)
He had stent placed by cards.  He couldn't tolerate statins.  He has zetia rx to try.  He took one dose of [place] but noticed that he was fatigued. He has not restarted the medicine in the meantime. This issue was stable, given his other issues ongoing. We discussed his cardiac workup and treatment, with stent placement. I greatly appreciate the help of the cardiology service.  Prev with hip replacement per ortho.    Family stress d/w pt.  He wants to go to counseling, d/w pt.  Wife's daughter remarried and this is a source of stress for him.  He wants to go see his minister about his situation and that is reasonable.  If not helpful, then I can suggest other options for counseling.  He has been using a benzodiazepine on a when necessary, but regular, basis as he has had frequent symptoms of anxiety. He has not been abusing the medication. He does not have any adverse effect.  AMW tabled.  Discussed with patient.  PMH and SH reviewed  ROS: Per HPI unless specifically indicated in ROS section   Meds, vitals, and allergies reviewed.   GEN: nad, alert and oriented, tearful but regains composure.  HEENT: mucous membranes moist NECK: supple w/o LA CV: rrr.  no murmur PULM: ctab, no inc wob ABD: soft, +bs EXT: no edema SKIN: no acute rash

## 2015-12-07 NOTE — Progress Notes (Signed)
Pre visit review using our clinic review tool, if applicable. No additional management support is needed unless otherwise documented below in the visit note. 

## 2015-12-08 NOTE — Assessment & Plan Note (Signed)
Recent labs noted in discussed with patient. Noted that his PSA is similar to previous. He has a previous negative biopsy and I would consider this PSA to be stable. Discussed with patient. It does not appear that he would need referral or further intervention at this point based on this PSA. He agrees.

## 2015-12-08 NOTE — Assessment & Plan Note (Signed)
He has a lot of social stressors noted.  He is going to start counseling.  I refilled his diazepam. Continue as is. No adverse effect on medication. We talked about prophylactic medication. Reasonable to start Lexapro 10 mg a day. Routine timeline for effect discussed with patient. He has no suicidal nor homicidal intent. He is okay for outpatient follow-up. I want him to start the Lexapro, use the benzodiazepine as needed, and update me in about 1-2 weeks, sooner if needed. He agrees. My hope is that with the addition of SSRI, he will need the diazepam less often. >25 minutes spent in face to face time with patient, >50% spent in counselling or coordination of care

## 2015-12-08 NOTE — Assessment & Plan Note (Signed)
We tried weight 1 med change to time. At this point he is pretty distraught but okay for outpatient follow-up. It is likely more important for him to start Lexapro at this point. We can address the Zetia after I hear how he is doing with the Lexapro addition. He agrees. No chest pain. No shortness of breath.

## 2015-12-18 ENCOUNTER — Encounter: Payer: Self-pay | Admitting: Family Medicine

## 2016-01-07 ENCOUNTER — Encounter: Payer: Self-pay | Admitting: Family Medicine

## 2016-01-08 ENCOUNTER — Encounter: Payer: Self-pay | Admitting: Family Medicine

## 2016-01-14 ENCOUNTER — Encounter: Payer: Self-pay | Admitting: Family Medicine

## 2016-01-15 ENCOUNTER — Encounter: Payer: Self-pay | Admitting: Family Medicine

## 2016-01-16 ENCOUNTER — Telehealth: Payer: Self-pay | Admitting: Family Medicine

## 2016-01-16 NOTE — Telephone Encounter (Signed)
Placed form in Dr. Duncan's inbox 

## 2016-01-16 NOTE — Telephone Encounter (Signed)
Patient dropped off a [place] form to be filled out.  The form was put in the providers prescription incoming box.

## 2016-01-17 ENCOUNTER — Encounter: Payer: Self-pay | Admitting: Family Medicine

## 2016-01-17 ENCOUNTER — Encounter: Payer: Self-pay | Admitting: Cardiology

## 2016-01-17 NOTE — Telephone Encounter (Signed)
I'll work on the hard copy.  Thanks.  

## 2016-01-18 ENCOUNTER — Other Ambulatory Visit: Payer: Self-pay | Admitting: Family Medicine

## 2016-01-18 MED ORDER — TRAMADOL HCL 50 MG PO TABS: 50.0000 mg | ORAL_TABLET | Freq: Three times a day (TID) | ORAL | 1 refills | Status: DC | PRN

## 2016-01-18 NOTE — Progress Notes (Signed)
Please verify current dose of tramadol- I thought he was taking up to 6 a day.  rx printed.  [facility].  Thanks.   Patient says he is taking 6 per day.  [facility]ed to [facility].  Mike Craze, CMA  01/18/2016

## 2016-01-20 ENCOUNTER — Encounter: Payer: Self-pay | Admitting: Family Medicine

## 2016-01-22 ENCOUNTER — Encounter: Payer: Self-pay | Admitting: *Deleted

## 2016-01-29 ENCOUNTER — Encounter: Payer: Self-pay | Admitting: Family Medicine

## 2016-01-30 ENCOUNTER — Other Ambulatory Visit: Payer: Medicare Other | Admitting: *Deleted

## 2016-01-30 DIAGNOSIS — E785 Hyperlipidemia, unspecified: Secondary | ICD-10-CM

## 2016-01-30 LAB — LIPID PANEL
CHOL/HDL RATIO: 4.1 ratio (ref <5.0)
Cholesterol: 169 mg/dL (ref <200)
HDL: 41 mg/dL (ref >40)
LDL CALC: 101 mg/dL — AB (ref <100)
Triglycerides: 135 mg/dL (ref <150)
VLDL: 27 mg/dL (ref <30)

## 2016-01-30 LAB — ALT: ALT: 11 U/L (ref 9–46)

## 2016-02-01 ENCOUNTER — Encounter: Payer: Self-pay | Admitting: Family Medicine

## 2016-02-05 ENCOUNTER — Telehealth: Payer: Self-pay | Admitting: Pediatrics

## 2016-02-05 NOTE — Telephone Encounter (Signed)
Patient called back in reference to our 3 voicemails left for him. Our call was to report lab results.  See 11/29 labs.  The encounter had already been closed due to inability to contact patient.  He was advised of results, to continue current plan, and eat heart-healthy diet. He voiced understanding and thanks for call.

## 2016-02-11 ENCOUNTER — Encounter: Payer: Self-pay | Admitting: Family Medicine

## 2016-03-03 DIAGNOSIS — K5790 Diverticulosis of intestine, part unspecified, without perforation or abscess without bleeding: Secondary | ICD-10-CM

## 2016-03-03 HISTORY — DX: Diverticulosis of intestine, part unspecified, without perforation or abscess without bleeding: K57.90

## 2016-03-03 HISTORY — PX: COLONOSCOPY: SHX174

## 2016-03-12 ENCOUNTER — Encounter: Payer: Self-pay | Admitting: Family Medicine

## 2016-03-12 ENCOUNTER — Ambulatory Visit (INDEPENDENT_AMBULATORY_CARE_PROVIDER_SITE_OTHER): Payer: Medicare Other | Admitting: Family Medicine

## 2016-03-12 DIAGNOSIS — M158 Other polyosteoarthritis: Secondary | ICD-10-CM

## 2016-03-12 DIAGNOSIS — E785 Hyperlipidemia, unspecified: Secondary | ICD-10-CM

## 2016-03-12 DIAGNOSIS — Z7189 Other specified counseling: Secondary | ICD-10-CM

## 2016-03-12 DIAGNOSIS — L853 Xerosis cutis: Secondary | ICD-10-CM

## 2016-03-12 DIAGNOSIS — G4700 Insomnia, unspecified: Secondary | ICD-10-CM | POA: Diagnosis not present

## 2016-03-12 DIAGNOSIS — F4329 Adjustment disorder with other symptoms: Secondary | ICD-10-CM

## 2016-03-12 DIAGNOSIS — Z7184 Encounter for health counseling related to travel: Secondary | ICD-10-CM

## 2016-03-12 MED ORDER — CIPROFLOXACIN HCL 500 MG PO TABS: ORAL_TABLET | ORAL | 0 refills | Status: DC

## 2016-03-12 NOTE — Progress Notes (Signed)
Upcoming trip to [place].  04/18/16-04/28/16.  He is low risk for malaria given his location.  D/w pt.  Reasonable to take cipro as needed for TD.  D/w pt.  rx done.    D/w pt about starting zetia.  He was worried that it was a statin, but d/w pt.  He is still working on his diet.  Prev lipids d/w pt.    Taking 2 tramadol TID for pain.  Good effect.  Off celebrex in the meantime.    Taking diazepam at night to sleep, nightly.  Still with some atypical dreams but he doesn't sleep well at night at all w/o the help from diazepam.  He clearly does worse with his sleep in the winter- this is not a new issue for patient.   Dry skin on the legs recently noted.    Family stressors noted.  He is looking for outlets/hobbies; d/w pt.  His upcoming mission trip will likely be good for him.    Meds, vitals, and allergies reviewed.   ROS: Per HPI unless specifically indicated in ROS section   GEN: nad, alert and oriented HEENT: mucous membranes moist NECK: supple w/o LA CV: rrr.  PULM: ctab, no inc wob ABD: soft, +bs EXT: no edema SKIN: no acute rash but dry skin on the legs.   seb cyst on back, not inflamed.

## 2016-03-12 NOTE — Patient Instructions (Addendum)
Zetia isn't a statin, so it would be okay to try it.   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  Sebaceous cyst on the back.  Notify me if it gets inflamed.   Take care.  Glad to see you.

## 2016-03-12 NOTE — Progress Notes (Signed)
Pre visit review using our clinic review tool, if applicable. No additional management support is needed unless otherwise documented below in the visit note. 

## 2016-03-13 DIAGNOSIS — Z7184 Encounter for health counseling related to travel: Secondary | ICD-10-CM | POA: Insufficient documentation

## 2016-03-13 DIAGNOSIS — L853 Xerosis cutis: Secondary | ICD-10-CM | POA: Insufficient documentation

## 2016-03-13 NOTE — Assessment & Plan Note (Signed)
Continue tramadol, no ADE on med.

## 2016-03-13 NOTE — Assessment & Plan Note (Signed)
cipro reasonable if patient has traveller's diarrhea.  He has travelled to the area prev and should do well.

## 2016-03-13 NOTE — Assessment & Plan Note (Signed)
Can use OTC lotion.

## 2016-03-13 NOTE — Assessment & Plan Note (Signed)
Family stressors noted.  He is looking for outlets/hobbies; d/w pt.  His upcoming mission trip will likely be good for him.  He agrees.  Okay for outpatient f/u.

## 2016-03-13 NOTE — Assessment & Plan Note (Signed)
D/w pt about starting zetia.  He was worried that it was a statin, but d/w pt.  He is still working on his diet.  Prev lipids d/w pt.   >25 minutes spent in face to face time with patient, >50% spent in counselling or coordination of care.

## 2016-03-13 NOTE — Assessment & Plan Note (Signed)
Taking diazepam at night to sleep, nightly.  Still with some atypical dreams but he doesn't sleep well at night at all w/o the help from diazepam.  He clearly does worse with his sleep in the winter- this is not a new issue for patient.

## 2016-03-27 ENCOUNTER — Encounter: Payer: Self-pay | Admitting: Family Medicine

## 2016-03-31 ENCOUNTER — Other Ambulatory Visit: Payer: Self-pay | Admitting: Family Medicine

## 2016-03-31 MED ORDER — CLOPIDOGREL BISULFATE 75 MG PO TABS: 75.0000 mg | ORAL_TABLET | Freq: Every day | ORAL | 1 refills | Status: DC

## 2016-03-31 MED ORDER — PANTOPRAZOLE SODIUM 40 MG PO TBEC: 40.0000 mg | DELAYED_RELEASE_TABLET | Freq: Every day | ORAL | 1 refills | Status: DC

## 2016-04-07 ENCOUNTER — Encounter: Payer: Self-pay | Admitting: Family Medicine

## 2016-04-10 ENCOUNTER — Other Ambulatory Visit: Payer: Self-pay | Admitting: Family Medicine

## 2016-04-10 MED ORDER — DIAZEPAM 5 MG PO TABS: 2.5000 mg | ORAL_TABLET | Freq: Every evening | ORAL | 1 refills | Status: DC | PRN

## 2016-04-10 NOTE — Progress Notes (Signed)
See mychart message.  Please fax in.  Thanks.

## 2016-04-10 NOTE — Telephone Encounter (Signed)
Rx faxed to [facility]

## 2016-04-13 ENCOUNTER — Encounter: Payer: Self-pay | Admitting: Family Medicine

## 2016-04-14 ENCOUNTER — Telehealth: Payer: Self-pay | Admitting: Family Medicine

## 2016-04-14 NOTE — Telephone Encounter (Signed)
I wouldn't start abx in the meantime.  Okay to use OTC cough/cold meds.  Thanks.

## 2016-04-14 NOTE — Telephone Encounter (Signed)
Spoke to patient and was advised that he does not know if he has had a fever or not, but has had chills and a cough. Appointment scheduled for tomorrow 04/16/15 with Dr. [name].

## 2016-04-14 NOTE — Telephone Encounter (Signed)
Patient notified as instructed by telephone and verbalized understanding. Patient stated that he took his temperature and it was 99.6. Patient stated that he has been having hiccups a lot in the last couple of days. Patient stated that he has tried home remedies and right now they are gone. Advised patient that if they come back and continue to let Dr. [name] know. Patient stated that he will be here tomorrow for his appointment.

## 2016-04-14 NOTE — Telephone Encounter (Signed)
Call pt.  See if he has any fevers. Let me know.  See if he can get in the clinic prior to departure this week.  Thanks.

## 2016-04-15 ENCOUNTER — Encounter: Payer: Self-pay | Admitting: Family Medicine

## 2016-04-15 ENCOUNTER — Ambulatory Visit (INDEPENDENT_AMBULATORY_CARE_PROVIDER_SITE_OTHER): Payer: Medicare Other | Admitting: Family Medicine

## 2016-04-15 DIAGNOSIS — R6889 Other general symptoms and signs: Secondary | ICD-10-CM | POA: Diagnosis not present

## 2016-04-15 MED ORDER — OSELTAMIVIR PHOSPHATE 75 MG PO CAPS: 75.0000 mg | ORAL_CAPSULE | Freq: Two times a day (BID) | ORAL | 0 refills | Status: DC

## 2016-04-15 NOTE — Progress Notes (Signed)
Possible sick contacts.  Sx started about 2 days ago.  Initially with hiccups, then started feeling worse in general.  After about a day, the hiccups got some better.  No hiccups today.  He has had chills.  Fevers, muscle aches,  Joint aches that migrate.  Facial pain and pressure noted prev but better/resolved now.    Fever 101.7 today.    Meds, vitals, and allergies reviewed.   ROS: Per HPI unless specifically indicated in ROS section   GEN: nad, alert and oriented HEENT: mucous membranes moist, tm w/o erythema, nasal exam w/o erythema, clear discharge noted,  OP with cobblestoning NECK: supple w/o LA CV: rrr.   PULM: ctab, no inc wob EXT: no edema SKIN: no acute rash

## 2016-04-15 NOTE — Progress Notes (Signed)
Pre visit review using our clinic review tool, if applicable. No additional management support is needed unless otherwise documented below in the visit note. 

## 2016-04-15 NOTE — Patient Instructions (Signed)
Presumed flu, start tamiflu.   Rest and fluids.  Update me as needed.  I think it is best to hold off on the upcoming travel.

## 2016-04-16 ENCOUNTER — Encounter: Payer: Self-pay | Admitting: Family Medicine

## 2016-04-16 DIAGNOSIS — R6889 Other general symptoms and signs: Secondary | ICD-10-CM | POA: Insufficient documentation

## 2016-04-16 NOTE — Assessment & Plan Note (Signed)
Presumed flu, start tamiflu.   Rest and fluids.  Update me as needed.  I think it is best to hold off on the upcoming travel, discussed with patient. He understood. Update me as needed. Okay for outpatient follow-up.

## 2016-04-17 ENCOUNTER — Encounter: Payer: Self-pay | Admitting: Family Medicine

## 2016-04-18 ENCOUNTER — Encounter: Payer: Self-pay | Admitting: Family Medicine

## 2016-04-21 ENCOUNTER — Telehealth: Payer: Self-pay | Admitting: Family Medicine

## 2016-04-21 NOTE — Telephone Encounter (Signed)
Patient Name: BENTO GANTERT Gender: Unknown DOB: 11-22-1945 Age: 71 Y 80 M 3 D Return Phone Number: [phone] (Primary), [phone] (Secondary) City/State/Zip: [place] [address] Client [facility] - Client Client Site [facility] - Day Physician Renford Dills - MD Who Is Calling Patient / Member / Family / Caregiver Call Type Triage / Clinical Relationship To Patient Self Return Phone Number [phone] (Primary) Chief Complaint Rash - Localized Reason for Call Symptomatic / Request for [place] says he did have the flu and felt better. Now he has a rash on his chest. Appointment Disposition EMR Appointment Not Necessary Info pasted into Epic Yes Nurse Assessment Nurse: Harlow Mares, RN, Suanne Marker Date/Time (Eastern Time): 04/21/2016 1:34:42 PM Confirm and document reason for call. If symptomatic, describe symptoms. ---Caller says he did have the flu and felt better. Now he has a rash on his chest. Has completed Tamiflu on Sat. Does the PT have any chronic conditions? (i.e. diabetes, asthma, etc.) ---Yes List chronic conditions. ---HTN Guidelines Guideline Title Affirmed Question Rash or Redness - Localized Mild localized rash (all triage questions negative) Disp. Time Eilene Ghazi Time) Disposition Final User 04/21/2016 1:40:06 Sharon, RN, [facility] Advice Given Per Guideline HOME CARE: You should be able to treat this at home. REASSURANCE: New localized rashes are usually due to skin contact with an irritating substance. [place] THE AREA: Wash the area once thoroughly with soap and water to remove any remaining irritants. Thereafter avoid soaps in this area. Cleanse the area if needed with warm water. LOCAL COLD: Apply ice or soak in cold water for 20 minutes every 3 or 4 hours to reduce itching or pain. HYDROCORTISONE CREAM: * For very itchy spots, apply hydrocortisone cream 4 times a day  as needed. * Available OTC in [place] as 0.5% and 1% cream. AVOID SCRATCHING: Try not to scratch. Cut your fingernails short. EXPECTED COURSE: Most of these rashes pass in 2 to 3 days. CALL BACK IF: * Rash lasts over 1 week. * Rash spreads or becomes worse * You become worse. CARE ADVICE given per Rash - Localized and Cause

## 2016-04-21 NOTE — Telephone Encounter (Signed)
[facility]  Patient Name: [name]  DOB: 1945/05/03    Initial Comment Caller says he did have the flu and felt better. Now he has a rash on his chest.    Nurse Assessment  Nurse: Harlow Mares, RN, Suanne Marker Date/Time (Eastern Time): 04/21/2016 1:34:42 PM  Confirm and document reason for call. If symptomatic, describe symptoms. ---Caller says he did have the flu and felt better. Now he has a rash on his chest. Has completed Tamiflu on Sat.  Does the patient have any new or worsening symptoms? ---Yes  Will a triage be completed? ---Yes  Related visit to physician within the last 2 weeks? ---Yes  Does the PT have any chronic conditions? (i.e. diabetes, asthma, etc.) ---Yes  List chronic conditions. ---HTN  Is this a behavioral health or substance abuse call? ---No     Guidelines    Guideline Title Affirmed Question Affirmed Notes  Rash or Redness - Localized Mild localized rash (all triage questions negative)    Final Disposition User   [place], RN, Suanne Marker    Disagree/Comply: Comply

## 2016-04-22 ENCOUNTER — Other Ambulatory Visit: Payer: Self-pay | Admitting: Family Medicine

## 2016-04-22 NOTE — Telephone Encounter (Signed)
Agreed. Home care is reasonable. Needs follow-up only if feeling worse or if the rash gets worse/persists. Thanks.

## 2016-04-22 NOTE — Telephone Encounter (Signed)
Patient notified as instructed by telephone and verbalized understanding. Patient stated that he is feeling better and rash has improved. Advised patient to call back for follow-up appointment if he does not continue to improve and he agreed.

## 2016-05-20 ENCOUNTER — Encounter: Payer: Self-pay | Admitting: Cardiology

## 2016-05-29 ENCOUNTER — Encounter: Payer: Self-pay | Admitting: Cardiology

## 2016-05-29 ENCOUNTER — Ambulatory Visit (INDEPENDENT_AMBULATORY_CARE_PROVIDER_SITE_OTHER): Payer: Medicare Other | Admitting: Cardiology

## 2016-05-29 VITALS — BP 102/66 | HR 70 | Ht 67.0 in | Wt 161.4 lb

## 2016-05-29 DIAGNOSIS — I2510 Atherosclerotic heart disease of native coronary artery without angina pectoris: Secondary | ICD-10-CM

## 2016-05-29 DIAGNOSIS — E7800 Pure hypercholesterolemia, unspecified: Secondary | ICD-10-CM

## 2016-05-29 DIAGNOSIS — Z95820 Peripheral vascular angioplasty status with implants and grafts: Secondary | ICD-10-CM

## 2016-05-29 DIAGNOSIS — Z789 Other specified health status: Secondary | ICD-10-CM | POA: Diagnosis not present

## 2016-05-29 DIAGNOSIS — Z959 Presence of cardiac and vascular implant and graft, unspecified: Secondary | ICD-10-CM | POA: Diagnosis not present

## 2016-05-29 NOTE — Patient Instructions (Addendum)
Medication Instructions:  Please stop your Zetia. You may stop your ASA after August 07, 2016. Continue all other medications as listed.  Follow-Up: Follow up in 1 year with Dr. Marlou Porch.  You will receive a letter in the mail 2 months before you are due.  Please call us when you receive this letter to schedule your follow up appointment.  If you need a refill on your cardiac medications before your next appointment, please call your pharmacy.  Thank you for choosing [facility]!!

## 2016-05-29 NOTE — Progress Notes (Signed)
Cardiology Office Note    Date:  05/29/2016   ID:  Chick, Cousins [birth year], MRN [id number]  PCP:  Elsie Stain, MD  Cardiologist:   Candee Furbish, MD     History of Present Illness:  [name] is a 71 y.o. male with coronary artery disease status post LAD drug-eluting stent placement on 08/08/15 after abnormal stress test here for follow-up. Also has essential hypertension, hyperlipidemia, GERD.  Interestingly, in review of phone note from 09/11/15, about 5 weeks after cardiac catheterization he was feeling paranoid and suspicious. He was worried that he may be having a reaction to Crestor. He stopped taking Crestor, Plavix, Toprol resolved and when he does not take the medications he feels better. He was instructed to go back on the Plavix by Dr. [name].  On 11/20/15 he emailed Dr. [name] stating that emotionally he was not doing very well.  Since stopping the Crestor, he is feeling much better. Unfortunately his wife broke her wrist. Dr. Apolonio Schneiders.  Overall he is doing quite well, no chest pain, no shortness of breath, no fevers, no chills, no syncope.  Took the Zetia, LDL was down to 101.Makes him sick, diarrhea. Stop. Fruit. Vegs. He is very Patent attorney. He sometimes wears ankle brace and knee braces. He asked about Celebrex, no. No chest pain, no shortness of breath him a no syncope, no bleeding.      Past Medical History:  Diagnosis Date  . Adjustment reaction with physical symptoms   . Cancer, skin, squamous cell    R shoulder, removed per derm 2012  . Coronary artery disease    a. 05/2015 c/p post-op surgery; b. 07/2015 MV EF 53%, basal inferoseptal, mid inferoseptal, and apical inferior mild ischemia;  c. 08/2015 Cath/PCI: LM nl, LAD 28m (3.5x20 Promus DES), 52m, LCX nl, RCA nl, EF 60%.  . Elevated PSA    h/o with normal bx per URO  . GERD (gastroesophageal reflux disease)   . History of BPH   . Hyperlipidemia   . Hypertension   . Nephrolithiasis   . OA  (osteoarthritis)    with r hip replacement  . Osteoarthrosis involving, or with mention of more than one site, but not specified as generalized, site unspecified(715.80)   . Syncope    negative cardiac w/u per Dr Irish Lack  . Tick bite 2011   likely tick associated illness, treated with doxy with resolution,  . Viral exanthem, unspecified     Past Surgical History:  Procedure Laterality Date  . ACHILLES TENDON REPAIR  1984  . CARDIAC CATHETERIZATION N/A 08/08/2015   Procedure: Left Heart Cath and Coronary Angiography;  Surgeon: Jerline Pain, MD;  Location: [facility];  Service: Cardiovascular;  Laterality: N/A;  . CARDIAC CATHETERIZATION N/A 08/08/2015   Procedure: Coronary Stent Intervention;  Surgeon: Jettie Booze, MD;  Location: [facility];  Service: Cardiovascular;  Laterality: N/A;  . CORONARY STENT PLACEMENT  08/08/2015   MID LAD WITH DES  . JOINT REPLACEMENT    . SHOULDER ARTHROSCOPY  2012   R rotator cuff (biceps tear not repaired)  . SKIN CANCER EXCISION     right shoulder  . TOTAL HIP ARTHROPLASTY     right  . TOTAL HIP ARTHROPLASTY Left 05/02/2015   Procedure: TOTAL LEFT HIP ARTHROPLASTY ANTERIOR APPROACH;  Surgeon: Gaynelle Arabian, MD;  Location: [facility];  Service: Orthopedics;  Laterality: Left;    Current Medications: Outpatient Medications Prior to Visit  Medication Sig  Dispense Refill  . amLODipine (NORVASC) 10 MG tablet Take 10 mg by mouth daily after lunch.     Marland Kitchen aspirin EC 81 MG tablet Take 81 mg by mouth daily after lunch.     . clopidogrel (PLAVIX) 75 MG tablet Take 1 tablet (75 mg total) by mouth daily with breakfast. 90 tablet 1  . diazepam (VALIUM) 5 MG tablet Take 0.5-1 tablets (2.5-5 mg total) by mouth at bedtime as needed for anxiety (and for insomnia). 90 tablet 1  . hydrocortisone cream 1 % Apply 1 application topically daily as needed for itching.    . nitroGLYCERIN (NITROSTAT) 0.4 MG SL tablet Place 1 tablet (0.4 mg total) under the  tongue every 5 (five) minutes as needed for chest pain. 25 tablet 3  . pantoprazole (PROTONIX) 40 MG tablet Take 1 tablet (40 mg total) by mouth daily. 90 tablet 1  . SIMPLY SALINE NA Place 1 spray into both nostrils daily.    . traMADol (ULTRAM) 50 MG tablet Take 1-2 tablets (50-100 mg total) by mouth every 8 (eight) hours as needed for moderate pain or severe pain. 540 tablet 1  . ezetimibe (ZETIA) 10 MG tablet Take 1 tablet (10 mg total) by mouth daily. 30 tablet 11  . ciprofloxacin (CIPRO) 500 MG tablet Take 1 tablet (500 mg) by mouth twice daily for 3 days as needed for dysentery during overseas travel. Disp 12 pills for 2 course. (Patient not taking: Reported on 04/15/2016) 12 tablet 0  . Alcaftadine (LASTACAFT) 0.25 % SOLN Place 1 drop into both eyes daily as needed (pain).    Marland Kitchen oseltamivir (TAMIFLU) 75 MG capsule Take 1 capsule (75 mg total) by mouth 2 (two) times daily. (Patient not taking: Reported on 05/29/2016) 10 capsule 0   No facility-administered medications prior to visit.      Allergies:   Xarelto [rivaroxaban]; Alprazolam; Doxycycline; Hydrocodone; Ibuprofen; Lipitor [atorvastatin]; Tamsulosin; Zolpidem tartrate; Crestor [rosuvastatin calcium]; and Zetia [ezetimibe]   Social History   Social History  . Marital status: Married    Spouse name: N/A  . Number of children: N/A  . Years of education: N/A   Occupational History  . 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 Smoking status: Former Smoker    Types: Cigarettes    Quit date: 04/12/1984  . Smokeless tobacco: Never Used  . Alcohol use No  . Drug use: No  . Sexual activity: Not Asked   Other Topics Concern  . 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     Retired.     His youngest daughter is dead     Family History:  The patient's family history includes Arrhythmia in his mother; Prostate cancer in his maternal uncle.   ROS:   Please see the history of present illness.    ROS All other systems reviewed and are negative.   PHYSICAL EXAM:   VS:  BP 102/66   Pulse 70   Ht 5\' 7"  (1.702 m)   Wt 161 lb 6.4 oz (73.2 kg)   BMI 25.28 kg/m    GEN: Well nourished, well developed, in no acute distress  HEENT: normal  Neck: no JVD,  carotid bruits, or masses Cardiac: RRR; no murmurs, rubs, or gallops,no edema  Respiratory:  clear to auscultation bilaterally, normal work of breathing GI: soft, nontender, nondistended, + BS MS: no deformity or atrophy  Skin: warm and dry, no rash Neuro:  Alert and Oriented x 3, Strength and sensation are intact Psych: euthymic mood, full affect   Wt Readings from Last 3 Encounters:  05/29/16 161 lb 6.4 oz (73.2 kg)  04/15/16 162 lb (73.5 kg)  03/12/16 159 lb 8 oz (72.3 kg)      Studies/Labs Reviewed:   EKG:  None today  Recent Labs: 12/03/2015: BUN 9; Creatinine, Ser 0.78; Hemoglobin 12.1; Platelets 351.0; Potassium 3.9; Sodium 140 01/30/2016: ALT 11   Lipid Panel    Component Value Date/Time   CHOL 169 01/30/2016 0832   TRIG 135 01/30/2016 0832   HDL 41 01/30/2016 0832   CHOLHDL 4.1 01/30/2016 0832   VLDL 27 01/30/2016 0832   LDLCALC 101 (H) 01/30/2016 0832   LDLDIRECT 135.6 09/10/2011 0835    Additional studies/ records that were reviewed today include:  Hospital notes, cardiac catheterization, EKGs reviewed.    ASSESSMENT:    1. CAD in native artery   2. S/P angioplasty with stent   3. Statin intolerance   4. Pure hypercholesterolemia      PLAN:  In order of problems listed above:  Coronary artery disease  - Prior percutaneous intervention of LAD, drug-eluting stent on 08/08/15.  - Currently on dual antiplatelet therapy.He can stop the aspirin on June 7. I  discussed with Dr. Irish Lack. On some of his patients he is continuing the Plavix in lieu of the aspirin.  - Appreciative of Dr. Irish Lack and [facility] in the past.  Hypertension  - Blood pressure medication reviewed, under good control  Hyperlipidemia  - Has had trouble with statin intolerance to Crestor, Lipitor, pravastatin.   - We tried Zetia 10 mg at last visit.  - He did not tolerate the Zetia well either, diarrhea. Continue with diet and exercise.  GERD  - Protonix. Plavix.  Medication Adjustments/Labs and Tests Ordered: Current medicines are reviewed at length with the patient today.  Concerns regarding medicines are outlined above.  Medication changes, Labs and Tests ordered today are listed in the Patient Instructions below. Patient Instructions  Medication Instructions:  Please stop your Zetia. You may stop your ASA after August 07, 2016. Continue all other medications as listed.  Follow-Up: Follow up in 1 year with Dr. Marlou Porch.  You will receive a letter in the mail 2 months before you are due.  Please call us when you receive this letter to schedule your follow up appointment.  If you need a refill on your cardiac medications before your next appointment, please call your pharmacy.  Thank you for choosing [facility]!!        Signed, Candee Furbish, MD  05/29/2016 11:32 AM    [address], [place], [place]  [address] Phone: [phone]; Fax: [phone]

## 2016-05-30 ENCOUNTER — Encounter (HOSPITAL_COMMUNITY): Payer: Self-pay

## 2016-05-30 ENCOUNTER — Inpatient Hospital Stay (HOSPITAL_COMMUNITY)
Admission: EM | Admit: 2016-05-30 | Discharge: 2016-06-01 | DRG: 392 | Disposition: A | Discharge status: Home / Self Care | Payer: Medicare Other | Attending: Family Medicine | Admitting: Family Medicine

## 2016-05-30 ENCOUNTER — Emergency Department (HOSPITAL_COMMUNITY): Payer: Medicare Other

## 2016-05-30 DIAGNOSIS — K529 Noninfective gastroenteritis and colitis, unspecified: Secondary | ICD-10-CM | POA: Diagnosis not present

## 2016-05-30 DIAGNOSIS — K219 Gastro-esophageal reflux disease without esophagitis: Secondary | ICD-10-CM | POA: Diagnosis present

## 2016-05-30 DIAGNOSIS — Z85828 Personal history of other malignant neoplasm of skin: Secondary | ICD-10-CM

## 2016-05-30 DIAGNOSIS — N281 Cyst of kidney, acquired: Secondary | ICD-10-CM | POA: Diagnosis present

## 2016-05-30 DIAGNOSIS — Z8042 Family history of malignant neoplasm of prostate: Secondary | ICD-10-CM

## 2016-05-30 DIAGNOSIS — N400 Enlarged prostate without lower urinary tract symptoms: Secondary | ICD-10-CM | POA: Diagnosis present

## 2016-05-30 DIAGNOSIS — Z885 Allergy status to narcotic agent status: Secondary | ICD-10-CM

## 2016-05-30 DIAGNOSIS — Z96643 Presence of artificial hip joint, bilateral: Secondary | ICD-10-CM | POA: Diagnosis present

## 2016-05-30 DIAGNOSIS — I2510 Atherosclerotic heart disease of native coronary artery without angina pectoris: Secondary | ICD-10-CM | POA: Diagnosis present

## 2016-05-30 DIAGNOSIS — K5730 Diverticulosis of large intestine without perforation or abscess without bleeding: Secondary | ICD-10-CM | POA: Diagnosis present

## 2016-05-30 DIAGNOSIS — F4320 Adjustment disorder, unspecified: Secondary | ICD-10-CM | POA: Diagnosis present

## 2016-05-30 DIAGNOSIS — Z888 Allergy status to other drugs, medicaments and biological substances status: Secondary | ICD-10-CM

## 2016-05-30 DIAGNOSIS — E785 Hyperlipidemia, unspecified: Secondary | ICD-10-CM | POA: Diagnosis present

## 2016-05-30 DIAGNOSIS — Z79899 Other long term (current) drug therapy: Secondary | ICD-10-CM

## 2016-05-30 DIAGNOSIS — Z881 Allergy status to other antibiotic agents status: Secondary | ICD-10-CM

## 2016-05-30 DIAGNOSIS — Z87891 Personal history of nicotine dependence: Secondary | ICD-10-CM

## 2016-05-30 DIAGNOSIS — Z7902 Long term (current) use of antithrombotics/antiplatelets: Secondary | ICD-10-CM

## 2016-05-30 DIAGNOSIS — I10 Essential (primary) hypertension: Secondary | ICD-10-CM | POA: Diagnosis not present

## 2016-05-30 DIAGNOSIS — K7689 Other specified diseases of liver: Secondary | ICD-10-CM | POA: Diagnosis present

## 2016-05-30 DIAGNOSIS — A084 Viral intestinal infection, unspecified: Secondary | ICD-10-CM | POA: Diagnosis not present

## 2016-05-30 DIAGNOSIS — Z886 Allergy status to analgesic agent status: Secondary | ICD-10-CM

## 2016-05-30 DIAGNOSIS — Z7982 Long term (current) use of aspirin: Secondary | ICD-10-CM

## 2016-05-30 DIAGNOSIS — Z8249 Family history of ischemic heart disease and other diseases of the circulatory system: Secondary | ICD-10-CM

## 2016-05-30 DIAGNOSIS — Z955 Presence of coronary angioplasty implant and graft: Secondary | ICD-10-CM

## 2016-05-30 DIAGNOSIS — G2581 Restless legs syndrome: Secondary | ICD-10-CM | POA: Diagnosis present

## 2016-05-30 LAB — URINALYSIS, ROUTINE W REFLEX MICROSCOPIC
Bilirubin Urine: NEGATIVE
Glucose, UA: NEGATIVE mg/dL
Hgb urine dipstick: NEGATIVE
KETONES UR: NEGATIVE mg/dL
LEUKOCYTES UA: NEGATIVE
NITRITE: NEGATIVE
Protein, ur: NEGATIVE mg/dL
SPECIFIC GRAVITY, URINE: 1.008 (ref 1.005–1.030)
pH: 6 (ref 5.0–8.0)

## 2016-05-30 LAB — COMPREHENSIVE METABOLIC PANEL
ALT: 12 U/L — AB (ref 17–63)
AST: 20 U/L (ref 15–41)
Albumin: 4.4 g/dL (ref 3.5–5.0)
Alkaline Phosphatase: 67 U/L (ref 38–126)
Anion gap: 7 (ref 5–15)
BUN: 10 mg/dL (ref 6–20)
CALCIUM: 9.3 mg/dL (ref 8.9–10.3)
CO2: 28 mmol/L (ref 22–32)
Chloride: 102 mmol/L (ref 101–111)
Creatinine, Ser: 0.82 mg/dL (ref 0.61–1.24)
GFR calc non Af Amer: >60 mL/min (ref >60)
GLUCOSE: 120 mg/dL — AB (ref 65–99)
POTASSIUM: 3.9 mmol/L (ref 3.5–5.1)
SODIUM: 137 mmol/L (ref 135–145)
TOTAL PROTEIN: 7.3 g/dL (ref 6.5–8.1)
Total Bilirubin: 0.6 mg/dL (ref 0.3–1.2)

## 2016-05-30 LAB — CBC
HEMATOCRIT: 39.1 % (ref 39.0–52.0)
HEMOGLOBIN: 12.5 g/dL — AB (ref 13.0–17.0)
MCH: 26.4 pg (ref 26.0–34.0)
MCHC: 32 g/dL (ref 30.0–36.0)
MCV: 82.7 fL (ref 78.0–100.0)
Platelets: 369 10*3/uL (ref 150–400)
RBC: 4.73 MIL/uL (ref 4.22–5.81)
RDW: 13.8 % (ref 11.5–15.5)
WBC: 13.5 10*3/uL — AB (ref 4.0–10.5)

## 2016-05-30 LAB — LIPASE, BLOOD: LIPASE: 18 U/L (ref 11–51)

## 2016-05-30 MED ORDER — ACETAMINOPHEN 650 MG RE SUPP: 650.0000 mg | Freq: Four times a day (QID) | RECTAL | Status: DC | PRN

## 2016-05-30 MED ORDER — ASPIRIN EC 81 MG PO TBEC
81.0000 mg | DELAYED_RELEASE_TABLET | Freq: Every day | ORAL | Status: DC
  Administered 2016-05-30: 81 mg via ORAL
  Filled 2016-05-30: qty 1

## 2016-05-30 MED ORDER — ONDANSETRON HCL 4 MG PO TABS: 4.0000 mg | ORAL_TABLET | Freq: Four times a day (QID) | ORAL | Status: DC | PRN

## 2016-05-30 MED ORDER — CLOPIDOGREL BISULFATE 75 MG PO TABS
75.0000 mg | ORAL_TABLET | Freq: Every day | ORAL | Status: DC
  Administered 2016-05-30 – 2016-06-01 (×3): 75 mg via ORAL
  Filled 2016-05-30 (×3): qty 1

## 2016-05-30 MED ORDER — MORPHINE SULFATE (PF) 4 MG/ML IV SOLN
4.0000 mg | Freq: Once | INTRAVENOUS | Status: AC
  Administered 2016-05-30: 4 mg via INTRAVENOUS
  Filled 2016-05-30: qty 1

## 2016-05-30 MED ORDER — IOPAMIDOL (ISOVUE-300) INJECTION 61%
INTRAVENOUS | Status: AC
  Administered 2016-05-30: 100 mL via INTRAVENOUS
  Filled 2016-05-30: qty 100

## 2016-05-30 MED ORDER — ACETAMINOPHEN 325 MG PO TABS
650.0000 mg | ORAL_TABLET | Freq: Four times a day (QID) | ORAL | Status: DC | PRN
  Filled 2016-05-30: qty 2

## 2016-05-30 MED ORDER — HEPARIN SODIUM (PORCINE) 5000 UNIT/ML IJ SOLN
5000.0000 [IU] | Freq: Three times a day (TID) | INTRAMUSCULAR | Status: DC
  Administered 2016-05-30 – 2016-06-01 (×6): 5000 [IU] via SUBCUTANEOUS
  Filled 2016-05-30 (×6): qty 1

## 2016-05-30 MED ORDER — PANTOPRAZOLE SODIUM 40 MG PO TBEC
40.0000 mg | DELAYED_RELEASE_TABLET | Freq: Every day | ORAL | Status: DC
  Administered 2016-05-30 – 2016-06-01 (×3): 40 mg via ORAL
  Filled 2016-05-30 (×3): qty 1

## 2016-05-30 MED ORDER — TRAMADOL HCL 50 MG PO TABS
50.0000 mg | ORAL_TABLET | Freq: Three times a day (TID) | ORAL | Status: DC | PRN
  Administered 2016-05-30 – 2016-05-31 (×3): 100 mg via ORAL
  Filled 2016-05-30 (×3): qty 2

## 2016-05-30 MED ORDER — SODIUM CHLORIDE 0.9 % IV SOLN
INTRAVENOUS | Status: AC
Start: 2016-05-30 — End: 2016-05-31
  Administered 2016-05-30 – 2016-05-31 (×2): via INTRAVENOUS

## 2016-05-30 MED ORDER — ONDANSETRON HCL 4 MG/2ML IJ SOLN
4.0000 mg | Freq: Four times a day (QID) | INTRAMUSCULAR | Status: DC | PRN
  Administered 2016-05-30 – 2016-05-31 (×2): 4 mg via INTRAVENOUS
  Filled 2016-05-30 (×3): qty 2

## 2016-05-30 MED ORDER — DIAZEPAM 5 MG PO TABS
2.5000 mg | ORAL_TABLET | Freq: Every evening | ORAL | Status: DC | PRN
  Administered 2016-05-30: 5 mg via ORAL
  Filled 2016-05-30: qty 1

## 2016-05-30 MED ORDER — HYDROMORPHONE HCL 1 MG/ML IJ SOLN
1.0000 mg | Freq: Once | INTRAMUSCULAR | Status: AC
  Administered 2016-05-30: 1 mg via INTRAVENOUS
  Filled 2016-05-30: qty 1

## 2016-05-30 MED ORDER — IOPAMIDOL (ISOVUE-300) INJECTION 61%
100.0000 mL | Freq: Once | INTRAVENOUS | Status: AC | PRN
  Administered 2016-05-30: 100 mL via INTRAVENOUS

## 2016-05-30 MED ORDER — NITROGLYCERIN 0.4 MG SL SUBL: 0.4000 mg | SUBLINGUAL_TABLET | SUBLINGUAL | Status: DC | PRN

## 2016-05-30 MED ORDER — PROMETHAZINE HCL 25 MG/ML IJ SOLN
12.5000 mg | Freq: Once | INTRAMUSCULAR | Status: AC
  Administered 2016-05-30: 12.5 mg via INTRAVENOUS
  Filled 2016-05-30: qty 1

## 2016-05-30 MED ORDER — ONDANSETRON HCL 4 MG/2ML IJ SOLN
4.0000 mg | Freq: Once | INTRAMUSCULAR | Status: AC
  Administered 2016-05-30: 4 mg via INTRAVENOUS
  Filled 2016-05-30: qty 2

## 2016-05-30 NOTE — ED Provider Notes (Signed)
Patient sign out from [name], [place].  "Patient presents with abdominal pain that started yesterday morning (05/29/16) around 11:00 am. He describes sharp, cramping abdominal pain that was generalized in the beginning and now across the mid-abdomen. No bloating or distention. He has had nausea and vomiting. No fever. He has taken Gasex and Miralax and has had several bowel movements but no relief of pain. No chest pain, cough, bloody stools or hematemesis. " -- per [name]'s note.   CLINICAL DATA:  Abdominal pain. Nausea and loose stools. Elevated white blood count  EXAM: CT ABDOMEN AND PELVIS WITH CONTRAST  TECHNIQUE: Multidetector CT imaging of the abdomen and pelvis was performed using the standard protocol following bolus administration of intravenous contrast.  CONTRAST:  100 mL Isovue 300 IV  COMPARISON:  CT abdomen pelvis 06/17/2015  FINDINGS: Lower chest: Lung bases are clear.  Hepatobiliary: 15 mm cyst in the left lobe liver unchanged. No other liver lesion. Gallbladder and bile ducts normal.  Pancreas: Negative  Spleen: Negative  Adrenals/Urinary Tract: 2 cm left renal cyst unchanged. No renal obstruction. Negative for urinary tract calculi. Bladder is partially obscured by streak artifact from bilateral hip replacement.  Stomach/Bowel: Normal stomach. Duodenum nondilated. The jejunum and a portion of the ileum are significantly dilated with multiple air-fluid levels and fluid filled bowel contents. Terminal ileum nondilated and non thickened. The colon is not dilated but is filled with liquid stool and multiple air-fluid levels, less prominent than that seen in the small bowel. Sigmoid diverticulosis without diverticulitis.  Normal appendix  Vascular/Lymphatic: Mild atherosclerotic disease. No aortic aneurysm. No significant lymphadenopathy.  Reproductive: Prostate obscured by streak artifact.  Other: Small amount of free fluid in the  pelvis.  Musculoskeletal: Lumbar degenerative changes with anterolisthesis L3-4. Bilateral hip replacement causing artifact. No acute skeletal abnormality.  IMPRESSION: Dilated fluid-filled small bowel with air-fluid levels. Terminal ileum decompressed. Colon decompressed with liquid stool in air-fluid levels. These findings could be due to enteritis versus small obstruction. Continued close followup is warranted.  Sigmoid diverticulosis without diverticulitis  Normal appendix  Small amount of free fluid in the pelvis.   Electronically Signed   By: Franchot Gallo M.D.   On: 05/30/2016 07:36   8:08 am Mildly elevated WBC, hemoglobin is 12.5. CTAP shows enteritis versus small obstruction, favor enteritis as he does not have risk factors and has been having diarrhea and vomiting. Discussed results with patient and recommend 24 hour obs for IV fluids and bowel rest and to make sure his symptoms are not due to early bowel obstruction. Otherwise he is well appearing, pain controlled with IV analgesic. --Discussed with [facility] hospitalist who has agreed to admit patient for obs, pain control, fluids and bowel rest.   Keith Haring, PA-C 05/30/16 0810    Lacretia Leigh, MD 05/30/16 [phone]

## 2016-05-30 NOTE — ED Notes (Signed)
Report called to 5w 

## 2016-05-30 NOTE — ED Provider Notes (Signed)
[facility] DEPT Provider Note   CSN: [id number] Arrival date & time: 05/30/16  0131     History   Chief Complaint Chief Complaint  Patient presents with  . Abdominal Pain    HPI DARIYON URQUILLA is a 71 y.o. male.  Patient presents with abdominal pain that started yesterday morning (05/29/16) around 11:00 am. He describes sharp, cramping abdominal pain that was generalized in the beginning and now across the mid-abdomen. No bloating or distention. He has had nausea and vomiting. No fever. He has taken Gasex and Miralax and has had several bowel movements but no relief of pain. No chest pain, cough, bloody stools or hematemesis.    The history is provided by the patient. No language interpreter was used.  Abdominal Pain   Associated symptoms include nausea and vomiting. Pertinent negatives include fever and myalgias.    Past Medical History:  Diagnosis Date  . Adjustment reaction with physical symptoms   . Cancer, skin, squamous cell    R shoulder, removed per derm 2012  . Coronary artery disease    a. 05/2015 c/p post-op surgery; b. 07/2015 MV EF 53%, basal inferoseptal, mid inferoseptal, and apical inferior mild ischemia;  c. 08/2015 Cath/PCI: LM nl, LAD 53m (3.5x20 Promus DES), 2m, LCX nl, RCA nl, EF 60%.  . Elevated PSA    h/o with normal bx per URO  . GERD (gastroesophageal reflux disease)   . History of BPH   . Hyperlipidemia   . Hypertension   . Nephrolithiasis   . OA (osteoarthritis)    with r hip replacement  . Osteoarthrosis involving, or with mention of more than one site, but not specified as generalized, site unspecified(715.80)   . Syncope    negative cardiac w/u per Dr Irish Lack  . Tick bite 2011   likely tick associated illness, treated with doxy with resolution,  . Viral exanthem, unspecified     Patient Active Problem List   Diagnosis Date Noted  . Flu-like symptoms 04/16/2016  . Dry skin 03/13/2016  . Encounter for counseling for travel 03/13/2016    . CAD (coronary artery disease) 08/09/2015  . Abnormal stress test   . OA (osteoarthritis) of hip 05/02/2015  . S/P total hip arthroplasty 05/02/2015  . Chest pain at rest 05/02/2015  . Muscle cramps 11/20/2014  . RLS (restless legs syndrome) 06/30/2014  . Golfer's elbow 06/30/2014  . Advance care planning 11/04/2013  . Insomnia 11/16/2011  . Medicare annual wellness visit, subsequent 09/17/2011  . FH: prostate cancer 09/17/2011  . Biceps muscle tear 09/13/2010  . GERD 09/25/2009  . HLD (hyperlipidemia) 09/06/2009  . ADJUSTMENT REACTION WITH PHYSICAL SYMPTOMS 09/06/2009  . Essential hypertension 09/06/2009  . Osteoarthritis of multiple joints 09/06/2009  . BPH (benign prostatic hyperplasia) 09/06/2009    Past Surgical History:  Procedure Laterality Date  . ACHILLES TENDON REPAIR  1984  . CARDIAC CATHETERIZATION N/A 08/08/2015   Procedure: Left Heart Cath and Coronary Angiography;  Surgeon: Jerline Pain, MD;  Location: [facility];  Service: Cardiovascular;  Laterality: N/A;  . CARDIAC CATHETERIZATION N/A 08/08/2015   Procedure: Coronary Stent Intervention;  Surgeon: Jettie Booze, MD;  Location: [facility];  Service: Cardiovascular;  Laterality: N/A;  . CORONARY STENT PLACEMENT  08/08/2015   MID LAD WITH DES  . JOINT REPLACEMENT    . SHOULDER ARTHROSCOPY  2012   R rotator cuff (biceps tear not repaired)  . SKIN CANCER EXCISION     right shoulder  .  TOTAL HIP ARTHROPLASTY     right  . TOTAL HIP ARTHROPLASTY Left 05/02/2015   Procedure: TOTAL LEFT HIP ARTHROPLASTY ANTERIOR APPROACH;  Surgeon: Gaynelle Arabian, MD;  Location: [facility];  Service: Orthopedics;  Laterality: Left;       Home Medications    Prior to Admission medications   Medication Sig Start Date End Date Taking? Authorizing Provider  Alcaftadine 0.25 % SOLN Place 1 drop into both eyes daily as needed (pain).    Historical Provider, MD  amLODipine (NORVASC) 10 MG tablet Take 10 mg by mouth  daily after lunch.  07/22/15   Historical Provider, MD  aspirin EC 81 MG tablet Take 81 mg by mouth daily after lunch.     Historical Provider, MD  ciprofloxacin (CIPRO) 500 MG tablet Take 1 tablet (500 mg) by mouth twice daily for 3 days as needed for dysentery during overseas travel. Disp 12 pills for 2 course. Patient not taking: Reported on 04/15/2016 03/12/16   Tonia Ghent, MD  clopidogrel (PLAVIX) 75 MG tablet Take 1 tablet (75 mg total) by mouth daily with breakfast. 03/31/16   Tonia Ghent, MD  diazepam (VALIUM) 5 MG tablet Take 0.5-1 tablets (2.5-5 mg total) by mouth at bedtime as needed for anxiety (and for insomnia). 04/10/16   Tonia Ghent, MD  hydrocortisone cream 1 % Apply 1 application topically daily as needed for itching.    Historical Provider, MD  nitroGLYCERIN (NITROSTAT) 0.4 MG SL tablet Place 1 tablet (0.4 mg total) under the tongue every 5 (five) minutes as needed for chest pain. 08/09/15   Rogelia Mire, NP  pantoprazole (PROTONIX) 40 MG tablet Take 1 tablet (40 mg total) by mouth daily. 03/31/16   Tonia Ghent, MD  SIMPLY SALINE NA Place 1 spray into both nostrils daily.    Historical Provider, MD  traMADol (ULTRAM) 50 MG tablet Take 1-2 tablets (50-100 mg total) by mouth every 8 (eight) hours as needed for moderate pain or severe pain. 01/18/16   Tonia Ghent, MD    Family History Family History  Problem Relation Age of Onset  . Arrhythmia Mother   . Prostate cancer Maternal Uncle   . Colon cancer Neg Hx   . Heart attack Neg Hx   . Hypertension Neg Hx     Social History Social History  Substance Use Topics  . Smoking status: Former Smoker    Types: Cigarettes    Quit date: 04/12/1984  . Smokeless tobacco: Never Used  . Alcohol use No     Allergies   Xarelto [rivaroxaban]; Alprazolam; Doxycycline; Hydrocodone; Ibuprofen; Lipitor [atorvastatin]; Tamsulosin; Zolpidem tartrate; Crestor [rosuvastatin calcium]; and Zetia [ezetimibe]   Review of  Systems Review of Systems  Constitutional: Negative for chills and fever.  Respiratory: Negative.  Negative for shortness of breath.   Cardiovascular: Negative.  Negative for chest pain.  Gastrointestinal: Positive for abdominal pain, nausea and vomiting. Negative for abdominal distention.  Genitourinary: Negative.   Musculoskeletal: Negative.  Negative for back pain and myalgias.  Neurological: Negative.  Negative for weakness.     Physical Exam Updated Vital Signs BP 128/68 (BP Location: Right Arm)   Pulse 69   Temp 98.8 F (37.1 C) (Oral)   Resp 18   Ht 5' 7.5" (1.715 m)   Wt 73.5 kg   SpO2 93%   BMI 25.00 kg/m   Physical Exam  Constitutional: He is oriented to person, place, and time. He appears well-developed and well-nourished.  HENT:  Head: Normocephalic.  Neck: Normal range of motion. Neck supple.  Cardiovascular: Normal rate and regular rhythm.   Pulmonary/Chest: Effort normal and breath sounds normal. He has no wheezes.  Abdominal: Soft. Bowel sounds are normal. There is tenderness (Significant tenderness across mid-abdomen from right lateral to left lateral areas.). There is no rebound and no guarding.  Musculoskeletal: Normal range of motion.  Neurological: He is alert and oriented to person, place, and time.  Skin: Skin is warm and dry. No rash noted.  Psychiatric: He has a normal mood and affect.     ED Treatments / Results  Labs (all labs ordered are listed, but only abnormal results are displayed) Labs Reviewed  COMPREHENSIVE METABOLIC PANEL - Abnormal; Notable for the following:       Result Value   Glucose, Bld 120 (*)    ALT 12 (*)    All other components within normal limits  CBC - Abnormal; Notable for the following:    WBC 13.5 (*)    Hemoglobin 12.5 (*)    All other components within normal limits  LIPASE, BLOOD  URINALYSIS, ROUTINE W REFLEX MICROSCOPIC    EKG  EKG Interpretation None       Radiology No results  found.  Procedures Procedures (including critical care time)  Medications Ordered in ED Medications  iopamidol (ISOVUE-300) 61 % injection 100 mL (not administered)  iopamidol (ISOVUE-300) 61 % injection (not administered)  morphine 4 MG/ML injection 4 mg (4 mg Intravenous Given 05/30/16 0559)  ondansetron (ZOFRAN) injection 4 mg (4 mg Intravenous Given 05/30/16 0559)     Initial Impression / Assessment and Plan / ED Course  I have reviewed the triage vital signs and the nursing notes.  Pertinent labs & imaging results that were available during my care of the patient were reviewed by me and considered in my medical decision making (see chart for details).     Patient presents with intense abdominal pain across middle abdomen x 1 day. Positive N, V. No fever.   IV started for pain medications and CT of abdomen. Pain is improved and patient is resting comfortably. Labs show a mild leukocytosis only.  CT pending. Patient care signed out to Delos Haring, PA-C, for reassessment after CT resulted.  Final Clinical Impressions(s) / ED Diagnoses   Final diagnoses:  None   1. Abdominal pain  New Prescriptions New Prescriptions   No medications on file     Charlann Lange, Hershal Coria 05/30/16 2841    Sherwood Gambler, MD 05/31/16 [phone]

## 2016-05-30 NOTE — H&P (Addendum)
History and Physical    [name] FWY:[id number] DOB: 1945-06-22 DOA: 05/30/2016  PCP: Elsie Stain, MD  Patient coming from: home  Chief Complaint: abdominal pain  HPI: [name] is a 71 y.o. male with medical history significant of past medical history of coronary artery C status post drug-eluting stent to the LAD in 08/08/2015 due to abnormal stress test comes into the hospital for abdominal pain nausea and vomiting and diarrhea. He relates today prior to admission he was eating some pizza and 2 hours after that he felt his stomach little bit upset at home and overnight started throwing up about 5 or 6 time systolic having watery diarrhea with no blood. He relates he continues to have abdominal pain no radiation nothing makes it better or worse his cramping intermittently.  ED Course:  He is found to be with a mild leukocytosis UA is clean CT scan of the abdomen shows mild thickening of the colon.  Review of Systems: As per HPI otherwise 10 point review of systems negative.    Past Medical History:  Diagnosis Date  . Adjustment reaction with physical symptoms   . Cancer, skin, squamous cell    R shoulder, removed per derm 2012  . Coronary artery disease    a. 05/2015 c/p post-op surgery; b. 07/2015 MV EF 53%, basal inferoseptal, mid inferoseptal, and apical inferior mild ischemia;  c. 08/2015 Cath/PCI: LM nl, LAD 47m (3.5x20 Promus DES), 69m, LCX nl, RCA nl, EF 60%.  . Elevated PSA    h/o with normal bx per URO  . GERD (gastroesophageal reflux disease)   . History of BPH   . Hyperlipidemia   . Hypertension   . Nephrolithiasis   . OA (osteoarthritis)    with r hip replacement  . Osteoarthrosis involving, or with mention of more than one site, but not specified as generalized, site unspecified(715.80)   . Syncope    negative cardiac w/u per Dr Irish Lack  . Tick bite 2011   likely tick associated illness, treated with doxy with resolution,  . Viral exanthem, unspecified      Past Surgical History:  Procedure Laterality Date  . ACHILLES TENDON REPAIR  1984  . CARDIAC CATHETERIZATION N/A 08/08/2015   Procedure: Left Heart Cath and Coronary Angiography;  Surgeon: Jerline Pain, MD;  Location: [place] [facility];  Service: Cardiovascular;  Laterality: N/A;  . CARDIAC CATHETERIZATION N/A 08/08/2015   Procedure: Coronary Stent Intervention;  Surgeon: Jettie Booze, MD;  Location: [place] [facility];  Service: Cardiovascular;  Laterality: N/A;  . CORONARY STENT PLACEMENT  08/08/2015   MID LAD WITH DES  . JOINT REPLACEMENT    . SHOULDER ARTHROSCOPY  2012   R rotator cuff (biceps tear not repaired)  . SKIN CANCER EXCISION     right shoulder  . TOTAL HIP ARTHROPLASTY     right  . TOTAL HIP ARTHROPLASTY Left 05/02/2015   Procedure: TOTAL LEFT HIP ARTHROPLASTY ANTERIOR APPROACH;  Surgeon: Gaynelle Arabian, MD;  Location: [facility];  Service: Orthopedics;  Laterality: Left;     reports that he quit smoking about 32 years ago. His smoking use included Cigarettes. He smoked 1.00 pack per day. He has never used smokeless tobacco. He reports that he does not drink alcohol or use drugs.  Allergies  Allergen Reactions  . Xarelto [Rivaroxaban] Rash    Rash and itching with blisters.  . Alprazolam Other (See Comments)    abnormal dreams  . Doxycycline Other (  See Comments)    GI upset  . Hydrocodone Other (See Comments)    Makes head buzz - tolerates oxycodone  . Ibuprofen Other (See Comments)    Cannot take with celebrex  . Lipitor [Atorvastatin] Other (See Comments)    Joints ached  . Tamsulosin Other (See Comments)    Malaise/ lethargy  . Zolpidem Tartrate Other (See Comments)    Terrible dreams   . Crestor [Rosuvastatin Calcium] Other (See Comments)    Paranoid   . Zetia [Ezetimibe] Diarrhea    Family History  Problem Relation Age of Onset  . Alzheimer's disease Father   . Arrhythmia Mother   . Prostate cancer Maternal Uncle   . Colon cancer Neg Hx   .  Heart attack Neg Hx   . Hypertension Neg Hx      Prior to Admission medications   Medication Sig Start Date End Date Taking? Authorizing Provider  Alcaftadine 0.25 % SOLN Place 1 drop into both eyes daily as needed (pain).   Yes Historical Provider, MD  aspirin EC 81 MG tablet Take 81 mg by mouth daily after lunch.    Yes Historical Provider, MD  clopidogrel (PLAVIX) 75 MG tablet Take 1 tablet (75 mg total) by mouth daily with breakfast. 03/31/16  Yes Tonia Ghent, MD  diazepam (VALIUM) 5 MG tablet Take 0.5-1 tablets (2.5-5 mg total) by mouth at bedtime as needed for anxiety (and for insomnia). 04/10/16  Yes Tonia Ghent, MD  hydrocortisone cream 1 % Apply 1 application topically daily as needed for itching.   Yes Historical Provider, MD  nitroGLYCERIN (NITROSTAT) 0.4 MG SL tablet Place 1 tablet (0.4 mg total) under the tongue every 5 (five) minutes as needed for chest pain. 08/09/15  Yes Rogelia Mire, NP  pantoprazole (PROTONIX) 40 MG tablet Take 1 tablet (40 mg total) by mouth daily. 03/31/16  Yes Tonia Ghent, MD  SIMPLY SALINE NA Place 1 spray into both nostrils daily.   Yes Historical Provider, MD  traMADol (ULTRAM) 50 MG tablet Take 1-2 tablets (50-100 mg total) by mouth every 8 (eight) hours as needed for moderate pain or severe pain. 01/18/16  Yes Tonia Ghent, MD  ciprofloxacin (CIPRO) 500 MG tablet Take 1 tablet (500 mg) by mouth twice daily for 3 days as needed for dysentery during overseas travel. Disp 12 pills for 2 course. Patient not taking: Reported on 04/15/2016 03/12/16   Tonia Ghent, MD    Physical Exam: Vitals:   05/30/16 0134 05/30/16 0509 05/30/16 0803 05/30/16 0804  BP: (!) 132/94 128/68 111/70   Pulse: 88 69 69 63  Resp: 19 18  15   Temp: 98.8 F (37.1 C)     TempSrc: Oral     SpO2: 96% 93% 97% 97%  Weight: 73.5 kg (162 lb)     Height: 5' 7.5" (1.715 m)       Constitutional: NAD, calm, comfortable Vitals:   05/30/16 0134 05/30/16 0509 05/30/16  0803 05/30/16 0804  BP: (!) 132/94 128/68 111/70   Pulse: 88 69 69 63  Resp: 19 18  15   Temp: 98.8 F (37.1 C)     TempSrc: Oral     SpO2: 96% 93% 97% 97%  Weight: 73.5 kg (162 lb)     Height: 5' 7.5" (1.715 m)      Eyes: Pupils equally round and reactive to light ENMT: Driving his membrane normal dentition Neck: normal, supple, no masses, no thyromegaly Respiratory: clear to auscultation  bilaterally, no wheezing, no crackles.  Cardiovascular: Regular rate and rhythm, no murmurs / rubs / gallops. No extremity edema. 2+ pedal pulses. No carotid bruits.  Abdomen: Positive bowel sounds no rebound or guarding or masses mild left lower quadrant tenderness Musculoskeletal: no clubbing / cyanosis. No joint deformity upper and lower extremities. Good ROM, no contractures. Normal muscle tone.  Skin: no rashes, lesions, ulcers. No induration Neurologic: CN 2-12 grossly intact. Sensation intact, DTR normal. Strength 5/5 in all 4.  Psychiatric: Normal judgment and insight. Alert and oriented x 3. Normal mood.   Labs on Admission: I have personally reviewed following labs and imaging studies  CBC:  Recent Labs Lab 05/30/16 0156  WBC 13.5*  HGB 12.5*  HCT 39.1  MCV 82.7  PLT 081   Basic Metabolic Panel:  Recent Labs Lab 05/30/16 0156  NA 137  K 3.9  CL 102  CO2 28  GLUCOSE 120*  BUN 10  CREATININE 0.82  CALCIUM 9.3   GFR: Estimated Creatinine Clearance: 79.8 mL/min (by C-G formula based on SCr of 0.82 mg/dL). Liver Function Tests:  Recent Labs Lab 05/30/16 0156  AST 20  ALT 12*  ALKPHOS 67  BILITOT 0.6  PROT 7.3  ALBUMIN 4.4    Recent Labs Lab 05/30/16 0156  LIPASE 18   No results for input(s): AMMONIA in the last 168 hours. Coagulation Profile: No results for input(s): INR, PROTIME in the last 168 hours. Cardiac Enzymes: No results for input(s): CKTOTAL, CKMB, CKMBINDEX, TROPONINI in the last 168 hours. BNP (last 3 results) No results for input(s): PROBNP  in the last 8760 hours. HbA1C: No results for input(s): HGBA1C in the last 72 hours. CBG: No results for input(s): GLUCAP in the last 168 hours. Lipid Profile: No results for input(s): CHOL, HDL, LDLCALC, TRIG, CHOLHDL, LDLDIRECT in the last 72 hours. Thyroid Function Tests: No results for input(s): TSH, T4TOTAL, FREET4, T3FREE, THYROIDAB in the last 72 hours. Anemia Panel: No results for input(s): VITAMINB12, FOLATE, FERRITIN, TIBC, IRON, RETICCTPCT in the last 72 hours. Urine analysis:    Component Value Date/Time   COLORURINE YELLOW 05/30/2016 [place] 05/30/2016 0152   LABSPEC 1.008 05/30/2016 0152   PHURINE 6.0 05/30/2016 0152   GLUCOSEU NEGATIVE 05/30/2016 0152   HGBUR NEGATIVE 05/30/2016 0152   BILIRUBINUR NEGATIVE 05/30/2016 0152   KETONESUR NEGATIVE 05/30/2016 0152   PROTEINUR NEGATIVE 05/30/2016 0152   UROBILINOGEN 0.2 09/18/2008 0815   NITRITE NEGATIVE 05/30/2016 0152   LEUKOCYTESUR NEGATIVE 05/30/2016 0152    Radiological Exams on Admission: Ct Abdomen Pelvis W Contrast  Result Date: 05/30/2016 CLINICAL DATA:  Abdominal pain. Nausea and loose stools. Elevated white blood count EXAM: CT ABDOMEN AND PELVIS WITH CONTRAST TECHNIQUE: Multidetector CT imaging of the abdomen and pelvis was performed using the standard protocol following bolus administration of intravenous contrast. CONTRAST:  100 mL Isovue 300 IV COMPARISON:  CT abdomen pelvis 06/17/2015 FINDINGS: Lower chest: Lung bases are clear. Hepatobiliary: 15 mm cyst in the left lobe liver unchanged. No other liver lesion. Gallbladder and bile ducts normal. Pancreas: Negative Spleen: Negative Adrenals/Urinary Tract: 2 cm left renal cyst unchanged. No renal obstruction. Negative for urinary tract calculi. Bladder is partially obscured by streak artifact from bilateral hip replacement. Stomach/Bowel: Normal stomach. Duodenum nondilated. The jejunum and a portion of the ileum are significantly dilated with  multiple air-fluid levels and fluid filled bowel contents. Terminal ileum nondilated and non thickened. The colon is not dilated but is filled with liquid  stool and multiple air-fluid levels, less prominent than that seen in the small bowel. Sigmoid diverticulosis without diverticulitis. Normal appendix Vascular/Lymphatic: Mild atherosclerotic disease. No aortic aneurysm. No significant lymphadenopathy. Reproductive: Prostate obscured by streak artifact. Other: Small amount of free fluid in the pelvis. Musculoskeletal: Lumbar degenerative changes with anterolisthesis L3-4. Bilateral hip replacement causing artifact. No acute skeletal abnormality. IMPRESSION: Dilated fluid-filled small bowel with air-fluid levels. Terminal ileum decompressed. Colon decompressed with liquid stool in air-fluid levels. These findings could be due to enteritis versus small obstruction. Continued close followup is warranted. Sigmoid diverticulosis without diverticulitis Normal appendix Small amount of free fluid in the pelvis. Electronically Signed   By: Franchot Gallo M.D.   On: 05/30/2016 07:36    EKG: Independently reviewed. MiraLAX is normal sinus rhythm  Assessment/Plan Acute gastroenteritis; This likely a viral gastroenteritis he denies seeing any bright red blood per rectum. Place in contact precautions, Zofran for nausea, will started her on a clear diet once he is able to tolerate feels like he can tolerate it. We'll start him on IV fluids recheck labs in the morning. Will monitor fever curve. Will not start antibiotics at these time as he has not had any bloody stools. If he is able to tolerate oral overnight can probably be discharged in the morning.  Essential hypertension Continue current home regimen no changes made.  GERD Cont PPI.  DVT prophylaxis: heaprin Code Status: full Family Communication: wife Consults called: none Admission status: observation   Charlynne Cousins MD [facility] Pager [phone]  If 7PM-7AM, please contact night-coverage [email or web address] Password [email or web address]  05/30/2016, 8:58 AM

## 2016-05-30 NOTE — ED Notes (Signed)
Rounded on pt. He is resting comfortably. States pain has improved. Wife at bedside.

## 2016-05-30 NOTE — ED Notes (Signed)
Tiffany greene, pa from cards at bedside.

## 2016-05-30 NOTE — ED Triage Notes (Signed)
Pt states that he started experiencing generalized abdominal pain and cramping after eating pizza for lunch. He endorses nausea and gas at well. A&Ox4. Ambulatory. Denies chest pain or SOB.

## 2016-05-30 NOTE — ED Notes (Signed)
Pt states that generalized  abd. Pain is a 7/10 descinbed as squeezing,  pain onset occurred after eating pizza, pt tried pantoprazole and miralax at home and was not effective in pain management.  Pt reports having 4 episodes of loose bowel, nausea and dry heaves. Pt wife is at bedside.

## 2016-05-30 NOTE — ED Notes (Signed)
EKG given to EDP,Goldston,.MD., for review. 

## 2016-05-31 DIAGNOSIS — Z886 Allergy status to analgesic agent status: Secondary | ICD-10-CM | POA: Diagnosis not present

## 2016-05-31 DIAGNOSIS — K529 Noninfective gastroenteritis and colitis, unspecified: Secondary | ICD-10-CM | POA: Diagnosis not present

## 2016-05-31 DIAGNOSIS — I10 Essential (primary) hypertension: Secondary | ICD-10-CM | POA: Diagnosis present

## 2016-05-31 DIAGNOSIS — K7689 Other specified diseases of liver: Secondary | ICD-10-CM | POA: Diagnosis present

## 2016-05-31 DIAGNOSIS — Z881 Allergy status to other antibiotic agents status: Secondary | ICD-10-CM | POA: Diagnosis not present

## 2016-05-31 DIAGNOSIS — A084 Viral intestinal infection, unspecified: Secondary | ICD-10-CM | POA: Diagnosis present

## 2016-05-31 DIAGNOSIS — G2581 Restless legs syndrome: Secondary | ICD-10-CM | POA: Diagnosis present

## 2016-05-31 DIAGNOSIS — R197 Diarrhea, unspecified: Secondary | ICD-10-CM | POA: Diagnosis present

## 2016-05-31 DIAGNOSIS — Z85828 Personal history of other malignant neoplasm of skin: Secondary | ICD-10-CM | POA: Diagnosis not present

## 2016-05-31 DIAGNOSIS — N281 Cyst of kidney, acquired: Secondary | ICD-10-CM | POA: Diagnosis present

## 2016-05-31 DIAGNOSIS — Z955 Presence of coronary angioplasty implant and graft: Secondary | ICD-10-CM | POA: Diagnosis not present

## 2016-05-31 DIAGNOSIS — Z79899 Other long term (current) drug therapy: Secondary | ICD-10-CM | POA: Diagnosis not present

## 2016-05-31 DIAGNOSIS — Z87891 Personal history of nicotine dependence: Secondary | ICD-10-CM | POA: Diagnosis not present

## 2016-05-31 DIAGNOSIS — Z888 Allergy status to other drugs, medicaments and biological substances status: Secondary | ICD-10-CM | POA: Diagnosis not present

## 2016-05-31 DIAGNOSIS — K219 Gastro-esophageal reflux disease without esophagitis: Secondary | ICD-10-CM | POA: Diagnosis present

## 2016-05-31 DIAGNOSIS — Z885 Allergy status to narcotic agent status: Secondary | ICD-10-CM | POA: Diagnosis not present

## 2016-05-31 DIAGNOSIS — K5730 Diverticulosis of large intestine without perforation or abscess without bleeding: Secondary | ICD-10-CM | POA: Diagnosis present

## 2016-05-31 DIAGNOSIS — Z8042 Family history of malignant neoplasm of prostate: Secondary | ICD-10-CM | POA: Diagnosis not present

## 2016-05-31 DIAGNOSIS — Z96643 Presence of artificial hip joint, bilateral: Secondary | ICD-10-CM | POA: Diagnosis present

## 2016-05-31 DIAGNOSIS — F4320 Adjustment disorder, unspecified: Secondary | ICD-10-CM | POA: Diagnosis present

## 2016-05-31 DIAGNOSIS — Z8249 Family history of ischemic heart disease and other diseases of the circulatory system: Secondary | ICD-10-CM | POA: Diagnosis not present

## 2016-05-31 DIAGNOSIS — I2510 Atherosclerotic heart disease of native coronary artery without angina pectoris: Secondary | ICD-10-CM | POA: Diagnosis present

## 2016-05-31 DIAGNOSIS — Z7902 Long term (current) use of antithrombotics/antiplatelets: Secondary | ICD-10-CM | POA: Diagnosis not present

## 2016-05-31 DIAGNOSIS — E785 Hyperlipidemia, unspecified: Secondary | ICD-10-CM | POA: Diagnosis present

## 2016-05-31 DIAGNOSIS — N400 Enlarged prostate without lower urinary tract symptoms: Secondary | ICD-10-CM | POA: Diagnosis present

## 2016-05-31 DIAGNOSIS — Z7982 Long term (current) use of aspirin: Secondary | ICD-10-CM | POA: Diagnosis not present

## 2016-05-31 LAB — CBC
HCT: 35.5 % — ABNORMAL LOW (ref 39.0–52.0)
HEMOGLOBIN: 11.4 g/dL — AB (ref 13.0–17.0)
MCH: 26.8 pg (ref 26.0–34.0)
MCHC: 32.1 g/dL (ref 30.0–36.0)
MCV: 83.5 fL (ref 78.0–100.0)
PLATELETS: 307 10*3/uL (ref 150–400)
RBC: 4.25 MIL/uL (ref 4.22–5.81)
RDW: 14.1 % (ref 11.5–15.5)
WBC: 8.2 10*3/uL (ref 4.0–10.5)

## 2016-05-31 MED ORDER — METOPROLOL TARTRATE 12.5 MG HALF TABLET
12.5000 mg | ORAL_TABLET | Freq: Two times a day (BID) | ORAL | Status: DC
  Administered 2016-05-31: 12.5 mg via ORAL
  Filled 2016-05-31: qty 1

## 2016-05-31 MED ORDER — HYDROMORPHONE HCL 1 MG/ML IJ SOLN
1.0000 mg | INTRAMUSCULAR | Status: DC | PRN
Start: 2016-05-31 — End: 2016-06-01
  Administered 2016-05-31: 1 mg via INTRAVENOUS
  Filled 2016-05-31 (×2): qty 1

## 2016-05-31 MED ORDER — TRAMADOL HCL 50 MG PO TABS
100.0000 mg | ORAL_TABLET | Freq: Four times a day (QID) | ORAL | Status: DC | PRN
  Filled 2016-05-31 (×2): qty 2

## 2016-05-31 MED ORDER — MORPHINE SULFATE (PF) 4 MG/ML IV SOLN
2.0000 mg | INTRAVENOUS | Status: DC | PRN
  Administered 2016-05-31 (×2): 2 mg via INTRAVENOUS
  Filled 2016-05-31 (×2): qty 1

## 2016-05-31 MED ORDER — ONDANSETRON HCL 4 MG/2ML IJ SOLN
4.0000 mg | Freq: Once | INTRAMUSCULAR | Status: AC
  Administered 2016-05-31: 4 mg via INTRAVENOUS
  Filled 2016-05-31: qty 2

## 2016-05-31 MED ORDER — SODIUM CHLORIDE 0.9 % IV SOLN
INTRAVENOUS | Status: AC
  Administered 2016-05-31 – 2016-06-01 (×2): via INTRAVENOUS

## 2016-05-31 NOTE — Care Management Obs Status (Signed)
[place] NOTIFICATION   Patient Details  Name: [name] MRN: [id number] Date of Birth: 09-20-1945   Medicare Observation Status Notification Given:  Yes    Erenest Rasher, RN 05/31/2016, 5:37 PM

## 2016-05-31 NOTE — Progress Notes (Signed)
Patient Demographics:    [name], is a 71 y.o. male, DOB - 11-16-1945, NTI:[id number]  Admit date - 05/30/2016   Admitting Physician Charlynne Cousins, MD  Outpatient Primary MD for the patient is Elsie Stain, MD  LOS - 1   Chief Complaint  Patient presents with  . Abdominal Pain        Subjective:    [name] today has  No chest pain,  No fevers , does have nausea vomiting abdominal pain  Assessment  & Plan :    Active Problems:   Essential hypertension   GERD   Acute gastroenteritis   Viral gastroenteritis  1)Intractable nausea vomiting with abdominal pain - patient continues to vomit, epigastric discomfort noted, suspect Acute gastroenteritis; CT abdomen and pelvic findings noted, continue Zofran when necessary, IV fluids until oral intake is reliable, clear liquid diet for now. Send stool studies, no antibiotics for now. Protonix as ordered  2)h/o CAD- last angioplasty and stenting was June 2017, continue aspirin and Plavix, last known EF was 53%, okay to use low-dose metoprolol with parameters. No chest pains or ACS type symptoms at this time   DVT prophylaxis: heparin Code Status: full Family Communication: wife Consults called: none Admission status: observation   Lab Results  Component Value Date   PLT 307 05/31/2016    Inpatient Medications  Scheduled Meds: . aspirin EC  81 mg Oral QPC lunch  . clopidogrel  75 mg Oral Q breakfast  . heparin  5,000 Units Subcutaneous Q8H  . pantoprazole  40 mg Oral Daily   Continuous Infusions: . sodium chloride     PRN Meds:.acetaminophen **OR** acetaminophen, diazepam, morphine injection, nitroGLYCERIN, ondansetron **OR** ondansetron (ZOFRAN) IV, traMADol    Anti-infectives    None        Objective:   Vitals:   05/30/16 0936 05/30/16 1420 05/30/16 2035 05/31/16 0450  BP: 105/70 117/79 127/82 103/72  Pulse: (!) 57  62 81 71  Resp: 18 18 18 18   Temp: 98.6 F (37 C) 98.4 F (36.9 C) 98.6 F (37 C) 98.3 F (36.8 C)  TempSrc: Oral Oral Oral Oral  SpO2: 97% 97% 96% 94%  Weight: 71.2 kg (157 lb)     Height: 5\' 7"  (1.702 m)       Wt Readings from Last 3 Encounters:  05/30/16 71.2 kg (157 lb)  05/29/16 73.2 kg (161 lb 6.4 oz)  04/15/16 73.5 kg (162 lb)     Intake/Output Summary (Last 24 hours) at 05/31/16 1512 Last data filed at 05/31/16 0006  Gross per 24 hour  Intake          1066.25 ml  Output                0 ml  Net          1066.25 ml     Physical Exam  Gen:- Awake Alert,  Vomiting or retching a bit HEENT:- [place].AT, No sclera icterus Neck-Supple Neck,No JVD,.  Lungs-  CTAB  CV- S1, S2 normal Abd-  +ve B.Sounds, Abd Soft, epigastric area tenderness without rebound or guarding, no CVA area tenderness,    Extremity/Skin:- No  edema,       Data Review:   Micro Results  No results found for this or any previous visit (from the past 240 hour(s)).  Radiology Reports Ct Abdomen Pelvis W Contrast  Result Date: 05/30/2016 CLINICAL DATA:  Abdominal pain. Nausea and loose stools. Elevated white blood count EXAM: CT ABDOMEN AND PELVIS WITH CONTRAST TECHNIQUE: Multidetector CT imaging of the abdomen and pelvis was performed using the standard protocol following bolus administration of intravenous contrast. CONTRAST:  100 mL Isovue 300 IV COMPARISON:  CT abdomen pelvis 06/17/2015 FINDINGS: Lower chest: Lung bases are clear. Hepatobiliary: 15 mm cyst in the left lobe liver unchanged. No other liver lesion. Gallbladder and bile ducts normal. Pancreas: Negative Spleen: Negative Adrenals/Urinary Tract: 2 cm left renal cyst unchanged. No renal obstruction. Negative for urinary tract calculi. Bladder is partially obscured by streak artifact from bilateral hip replacement. Stomach/Bowel: Normal stomach. Duodenum nondilated. The jejunum and a portion of the ileum are significantly dilated with multiple  air-fluid levels and fluid filled bowel contents. Terminal ileum nondilated and non thickened. The colon is not dilated but is filled with liquid stool and multiple air-fluid levels, less prominent than that seen in the small bowel. Sigmoid diverticulosis without diverticulitis. Normal appendix Vascular/Lymphatic: Mild atherosclerotic disease. No aortic aneurysm. No significant lymphadenopathy. Reproductive: Prostate obscured by streak artifact. Other: Small amount of free fluid in the pelvis. Musculoskeletal: Lumbar degenerative changes with anterolisthesis L3-4. Bilateral hip replacement causing artifact. No acute skeletal abnormality. IMPRESSION: Dilated fluid-filled small bowel with air-fluid levels. Terminal ileum decompressed. Colon decompressed with liquid stool in air-fluid levels. These findings could be due to enteritis versus small obstruction. Continued close followup is warranted. Sigmoid diverticulosis without diverticulitis Normal appendix Small amount of free fluid in the pelvis. Electronically Signed   By: Franchot Gallo M.D.   On: 05/30/2016 07:36     CBC  Recent Labs Lab 05/30/16 0156 05/31/16 0549  WBC 13.5* 8.2  HGB 12.5* 11.4*  HCT 39.1 35.5*  PLT 369 307  MCV 82.7 83.5  MCH 26.4 26.8  MCHC 32.0 32.1  RDW 13.8 14.1    Chemistries   Recent Labs Lab 05/30/16 0156  NA 137  K 3.9  CL 102  CO2 28  GLUCOSE 120*  BUN 10  CREATININE 0.82  CALCIUM 9.3  AST 20  ALT 12*  ALKPHOS 67  BILITOT 0.6   ------------------------------------------------------------------------------------------------------------------ No results for input(s): CHOL, HDL, LDLCALC, TRIG, CHOLHDL, LDLDIRECT in the last 72 hours.  No results found for: HGBA1C ------------------------------------------------------------------------------------------------------------------ No results for input(s): TSH, T4TOTAL, T3FREE, THYROIDAB in the last 72 hours.  Invalid input(s):  FREET3 ------------------------------------------------------------------------------------------------------------------ No results for input(s): VITAMINB12, FOLATE, FERRITIN, TIBC, IRON, RETICCTPCT in the last 72 hours.  Coagulation profile No results for input(s): INR, PROTIME in the last 168 hours.  No results for input(s): DDIMER in the last 72 hours.  Cardiac Enzymes No results for input(s): CKMB, TROPONINI, MYOGLOBIN in the last 168 hours.  Invalid input(s): CK ------------------------------------------------------------------------------------------------------------------ No results found for: BNP   Anasofia Micallef M.D on 05/31/2016 at 3:12 PM  Between 7am to 7pm - Pager - [phone]  After 7pm go to [email or web address] - password [email or web address]  [facility] -  Office  [phone]  Dragon dictation system was used to create this note, attempts have been made to correct errors, however presence of uncorrected errors is not a reflection quality of care provided

## 2016-06-01 ENCOUNTER — Emergency Department (HOSPITAL_BASED_OUTPATIENT_CLINIC_OR_DEPARTMENT_OTHER)
Admission: EM | Admit: 2016-06-01 | Discharge: 2016-06-02 | Disposition: A | Discharge status: Home / Self Care | Payer: Medicare Other | Attending: Emergency Medicine | Admitting: Emergency Medicine

## 2016-06-01 ENCOUNTER — Encounter (HOSPITAL_BASED_OUTPATIENT_CLINIC_OR_DEPARTMENT_OTHER): Payer: Self-pay | Admitting: Emergency Medicine

## 2016-06-01 ENCOUNTER — Encounter: Payer: Self-pay | Admitting: Family Medicine

## 2016-06-01 DIAGNOSIS — Z7982 Long term (current) use of aspirin: Secondary | ICD-10-CM | POA: Insufficient documentation

## 2016-06-01 DIAGNOSIS — A084 Viral intestinal infection, unspecified: Secondary | ICD-10-CM

## 2016-06-01 DIAGNOSIS — Z85828 Personal history of other malignant neoplasm of skin: Secondary | ICD-10-CM | POA: Insufficient documentation

## 2016-06-01 DIAGNOSIS — Z79899 Other long term (current) drug therapy: Secondary | ICD-10-CM | POA: Insufficient documentation

## 2016-06-01 DIAGNOSIS — Z87891 Personal history of nicotine dependence: Secondary | ICD-10-CM | POA: Insufficient documentation

## 2016-06-01 DIAGNOSIS — I2510 Atherosclerotic heart disease of native coronary artery without angina pectoris: Secondary | ICD-10-CM | POA: Insufficient documentation

## 2016-06-01 DIAGNOSIS — I10 Essential (primary) hypertension: Secondary | ICD-10-CM | POA: Insufficient documentation

## 2016-06-01 LAB — CBC WITH DIFFERENTIAL/PLATELET
BASOS ABS: 0 10*3/uL (ref 0.0–0.1)
Basophils Relative: 0 %
EOS ABS: 0 10*3/uL (ref 0.0–0.7)
EOS PCT: 1 %
HCT: 37.7 % — ABNORMAL LOW (ref 39.0–52.0)
Hemoglobin: 12.5 g/dL — ABNORMAL LOW (ref 13.0–17.0)
Lymphocytes Relative: 16 %
Lymphs Abs: 1 10*3/uL (ref 0.7–4.0)
MCH: 27.3 pg (ref 26.0–34.0)
MCHC: 33.2 g/dL (ref 30.0–36.0)
MCV: 82.3 fL (ref 78.0–100.0)
MONO ABS: 0.7 10*3/uL (ref 0.1–1.0)
Monocytes Relative: 11 %
Neutro Abs: 4.7 10*3/uL (ref 1.7–7.7)
Neutrophils Relative %: 72 %
PLATELETS: 327 10*3/uL (ref 150–400)
RBC: 4.58 MIL/uL (ref 4.22–5.81)
RDW: 13.5 % (ref 11.5–15.5)
WBC: 6.5 10*3/uL (ref 4.0–10.5)

## 2016-06-01 LAB — COMPREHENSIVE METABOLIC PANEL
ALT: 12 U/L — ABNORMAL LOW (ref 17–63)
AST: 20 U/L (ref 15–41)
Albumin: 3.9 g/dL (ref 3.5–5.0)
Alkaline Phosphatase: 62 U/L (ref 38–126)
Anion gap: 11 (ref 5–15)
BUN: 5 mg/dL — ABNORMAL LOW (ref 6–20)
CHLORIDE: 103 mmol/L (ref 101–111)
CO2: 24 mmol/L (ref 22–32)
Calcium: 9.1 mg/dL (ref 8.9–10.3)
Creatinine, Ser: 0.81 mg/dL (ref 0.61–1.24)
Glucose, Bld: 99 mg/dL (ref 65–99)
POTASSIUM: 3.6 mmol/L (ref 3.5–5.1)
SODIUM: 138 mmol/L (ref 135–145)
Total Bilirubin: 0.5 mg/dL (ref 0.3–1.2)
Total Protein: 7.4 g/dL (ref 6.5–8.1)

## 2016-06-01 MED ORDER — FAMOTIDINE IN NACL 20-0.9 MG/50ML-% IV SOLN
20.0000 mg | Freq: Once | INTRAVENOUS | Status: AC
  Administered 2016-06-01: 20 mg via INTRAVENOUS
  Filled 2016-06-01: qty 50

## 2016-06-01 MED ORDER — KETOROLAC TROMETHAMINE 30 MG/ML IJ SOLN
30.0000 mg | Freq: Once | INTRAMUSCULAR | Status: AC
  Administered 2016-06-01: 30 mg via INTRAVENOUS
  Filled 2016-06-01: qty 1

## 2016-06-01 MED ORDER — PROMETHAZINE HCL 25 MG/ML IJ SOLN
12.5000 mg | Freq: Once | INTRAMUSCULAR | Status: AC
  Administered 2016-06-01: 12.5 mg via INTRAVENOUS
  Filled 2016-06-01: qty 1

## 2016-06-01 MED ORDER — POTASSIUM CHLORIDE CRYS ER 20 MEQ PO TBCR
40.0000 meq | EXTENDED_RELEASE_TABLET | Freq: Once | ORAL | Status: AC
  Administered 2016-06-01: 40 meq via ORAL
  Filled 2016-06-01: qty 2

## 2016-06-01 MED ORDER — ACETAMINOPHEN 500 MG PO TABS
1000.0000 mg | ORAL_TABLET | Freq: Once | ORAL | Status: AC
  Administered 2016-06-01: 1000 mg via ORAL
  Filled 2016-06-01: qty 2

## 2016-06-01 MED ORDER — BISMUTH SUBSALICYLATE 262 MG/15ML PO SUSP: 30.0000 mL | Freq: Three times a day (TID) | ORAL | 0 refills | Status: DC

## 2016-06-01 MED ORDER — GI COCKTAIL ~~LOC~~
30.0000 mL | Freq: Once | ORAL | Status: AC
  Administered 2016-06-01: 30 mL via ORAL
  Filled 2016-06-01: qty 30

## 2016-06-01 MED ORDER — ONDANSETRON HCL 4 MG PO TABS: 4.0000 mg | ORAL_TABLET | Freq: Four times a day (QID) | ORAL | 0 refills | Status: DC | PRN

## 2016-06-01 MED ORDER — SODIUM CHLORIDE 0.9 % IV BOLUS (SEPSIS)
1000.0000 mL | Freq: Once | INTRAVENOUS | Status: AC
  Administered 2016-06-01: 1000 mL via INTRAVENOUS

## 2016-06-01 NOTE — Progress Notes (Signed)
Patient discharged home.  Leaving with personal belongings and prescription.  Patient and wife report understanding of discharge instructions.  Room air, no s/s of distress.  Accompanied by wife.  No complaints.

## 2016-06-01 NOTE — ED Notes (Signed)
EDP at [facility] 

## 2016-06-01 NOTE — ED Notes (Signed)
Alert, NAD, calm, interactive, resps e/u, speaking in clear complete sentences, no dyspnea noted, skin W&D, VSS, c/o abd pain, nausea, dizziness, general weakness and diarrhea, (denies: sob, fever, bleeding, or visual changes), up to [facility] for BM. Self transfers from stretcher to [facility] w/o incident. Steady on feet. Family at [facility].

## 2016-06-01 NOTE — Discharge Summary (Signed)
[name], is a 71 y.o. male  DOB 09-Dec-1945  MRN [id number].  Admission date:  05/30/2016  Admitting Physician  Charlynne Cousins, MD  Discharge Date:  06/01/2016   Primary MD  Elsie Stain, MD  Recommendations for primary care physician for things to follow:   Admission Diagnosis  Viral gastroenteritis [A08.4]  Discharge Diagnosis  Viral gastroenteritis [A08.4]   Principal Problem:   Acute gastroenteritis Active Problems:   Essential hypertension   GERD   Viral gastroenteritis      Past Medical History:  Diagnosis Date  . Adjustment reaction with physical symptoms   . Cancer, skin, squamous cell    R shoulder, removed per derm 2012  . Coronary artery disease    a. 05/2015 c/p post-op surgery; b. 07/2015 MV EF 53%, basal inferoseptal, mid inferoseptal, and apical inferior mild ischemia;  c. 08/2015 Cath/PCI: LM nl, LAD 42m (3.5x20 Promus DES), 18m, LCX nl, RCA nl, EF 60%.  . Elevated PSA    h/o with normal bx per URO  . GERD (gastroesophageal reflux disease)   . History of BPH   . Hyperlipidemia   . Hypertension   . Nephrolithiasis   . OA (osteoarthritis)    with r hip replacement  . Osteoarthrosis involving, or with mention of more than one site, but not specified as generalized, site unspecified(715.80)   . Syncope    negative cardiac w/u per Dr Irish Lack  . Tick bite 2011   likely tick associated illness, treated with doxy with resolution,  . Viral exanthem, unspecified     Past Surgical History:  Procedure Laterality Date  . ACHILLES TENDON REPAIR  1984  . CARDIAC CATHETERIZATION N/A 08/08/2015   Procedure: Left Heart Cath and Coronary Angiography;  Surgeon: Jerline Pain, MD;  Location: [facility];  Service: Cardiovascular;  Laterality: N/A;  . CARDIAC CATHETERIZATION N/A 08/08/2015   Procedure: Coronary Stent Intervention;  Surgeon: Jettie Booze, MD;  Location: [facility];  Service: Cardiovascular;  Laterality: N/A;  . CORONARY STENT PLACEMENT  08/08/2015   MID LAD WITH DES  . JOINT REPLACEMENT    . SHOULDER ARTHROSCOPY  2012   R rotator cuff (biceps tear not repaired)  . SKIN CANCER EXCISION     right shoulder  . TOTAL HIP ARTHROPLASTY     right  . TOTAL HIP ARTHROPLASTY Left 05/02/2015   Procedure: TOTAL LEFT HIP ARTHROPLASTY ANTERIOR APPROACH;  Surgeon: Gaynelle Arabian, MD;  Location: [facility];  Service: Orthopedics;  Laterality: Left;       HPI  from the history and physical done on the day of admission:   HPI: [name] is a 71 y.o. male with medical history significant of past medical history of coronary artery C status post drug-eluting stent to the LAD in 08/08/2015 due to abnormal stress test comes into the hospital for abdominal pain nausea and vomiting and diarrhea. He relates today prior to admission he was eating some pizza and 2 hours after  that he felt his stomach little bit upset at home and overnight started throwing up about 5 or 6 time systolic having watery diarrhea with no blood. He relates he continues to have abdominal pain no radiation nothing makes it better or worse his cramping intermittently.  ED Course:  He is found to be with a mild leukocytosis UA is clean CT scan of the abdomen shows mild thickening of the colon.       Hospital Course:      1)Intractable nausea vomiting with abdominal pain -  Resolved, in retrospect patient probably had a viral gastroenteritis,  CT abdomen and pelvic findings noted . Patient was treated with IV fluids, antiemetics  2)h/o CAD- last angioplasty and stenting was June 2017, continue aspirin and Plavix, last known EF was 53%. No chest pains or ACS type symptoms at this time   Discharge Condition: Stable  Diet and Activity recommendation:  As advised  Discharge Instructions     Discharge Instructions    Call MD for:  difficulty breathing, headache or visual  disturbances    Complete by:  As directed    Call MD for:  persistant dizziness or light-headedness    Complete by:  As directed    Call MD for:  persistant nausea and vomiting    Complete by:  As directed    Call MD for:  redness, tenderness, or signs of infection (pain, swelling, redness, odor or green/yellow discharge around incision site)    Complete by:  As directed    Call MD for:  severe uncontrolled pain    Complete by:  As directed    Call MD for:  temperature >100.4    Complete by:  As directed    Diet - low sodium heart healthy    Complete by:  As directed    Discharge instructions    Complete by:  As directed    BRAT Diet advised, push fluids Avoid Apple juice and caffeinated drinks Take medications as prescribed   Increase activity slowly    Complete by:  As directed         Discharge Medications     Allergies as of 06/01/2016      Reactions   Xarelto [rivaroxaban] Rash   Rash and itching with blisters.   Alprazolam Other (See Comments)   abnormal dreams   Doxycycline Other (See Comments)   GI upset   Hydrocodone Other (See Comments)   Makes head buzz - tolerates oxycodone   Ibuprofen Other (See Comments)   Cannot take with celebrex   Lipitor [atorvastatin] Other (See Comments)   Joints ached   Tamsulosin Other (See Comments)   Malaise/ lethargy   Zolpidem Tartrate Other (See Comments)   Terrible dreams    Crestor [rosuvastatin Calcium] Other (See Comments)   Paranoid    Zetia [ezetimibe] Diarrhea      Medication List    STOP taking these medications   ciprofloxacin 500 MG tablet Commonly known as:  CIPRO     TAKE these medications   Alcaftadine 0.25 % Soln Place 1 drop into both eyes daily as needed (pain).   aspirin EC 81 MG tablet Take 81 mg by mouth daily after lunch.   clopidogrel 75 MG tablet Commonly known as:  PLAVIX Take 1 tablet (75 mg total) by mouth daily with breakfast.   diazepam 5 MG tablet Commonly known as:   VALIUM Take 0.5-1 tablets (2.5-5 mg total) by mouth at bedtime as needed for anxiety (and  for insomnia).   hydrocortisone cream 1 % Apply 1 application topically daily as needed for itching.   nitroGLYCERIN 0.4 MG SL tablet Commonly known as:  NITROSTAT Place 1 tablet (0.4 mg total) under the tongue every 5 (five) minutes as needed for chest pain.   ondansetron 4 MG tablet Commonly known as:  ZOFRAN Take 1 tablet (4 mg total) by mouth every 6 (six) hours as needed for nausea.   pantoprazole 40 MG tablet Commonly known as:  PROTONIX Take 1 tablet (40 mg total) by mouth daily.   SIMPLY SALINE NA Place 1 spray into both nostrils daily.   traMADol 50 MG tablet Commonly known as:  ULTRAM Take 1-2 tablets (50-100 mg total) by mouth every 8 (eight) hours as needed for moderate pain or severe pain.       Major procedures and Radiology Reports - PLEASE review detailed and final reports for all details, in brief -   Ct Abdomen Pelvis W Contrast  Result Date: 05/30/2016 CLINICAL DATA:  Abdominal pain. Nausea and loose stools. Elevated white blood count EXAM: CT ABDOMEN AND PELVIS WITH CONTRAST TECHNIQUE: Multidetector CT imaging of the abdomen and pelvis was performed using the standard protocol following bolus administration of intravenous contrast. CONTRAST:  100 mL Isovue 300 IV COMPARISON:  CT abdomen pelvis 06/17/2015 FINDINGS: Lower chest: Lung bases are clear. Hepatobiliary: 15 mm cyst in the left lobe liver unchanged. No other liver lesion. Gallbladder and bile ducts normal. Pancreas: Negative Spleen: Negative Adrenals/Urinary Tract: 2 cm left renal cyst unchanged. No renal obstruction. Negative for urinary tract calculi. Bladder is partially obscured by streak artifact from bilateral hip replacement. Stomach/Bowel: Normal stomach. Duodenum nondilated. The jejunum and a portion of the ileum are significantly dilated with multiple air-fluid levels and fluid filled bowel contents. Terminal  ileum nondilated and non thickened. The colon is not dilated but is filled with liquid stool and multiple air-fluid levels, less prominent than that seen in the small bowel. Sigmoid diverticulosis without diverticulitis. Normal appendix Vascular/Lymphatic: Mild atherosclerotic disease. No aortic aneurysm. No significant lymphadenopathy. Reproductive: Prostate obscured by streak artifact. Other: Small amount of free fluid in the pelvis. Musculoskeletal: Lumbar degenerative changes with anterolisthesis L3-4. Bilateral hip replacement causing artifact. No acute skeletal abnormality. IMPRESSION: Dilated fluid-filled small bowel with air-fluid levels. Terminal ileum decompressed. Colon decompressed with liquid stool in air-fluid levels. These findings could be due to enteritis versus small obstruction. Continued close followup is warranted. Sigmoid diverticulosis without diverticulitis Normal appendix Small amount of free fluid in the pelvis. Electronically Signed   By: Franchot Gallo M.D.   On: 05/30/2016 07:36    Micro Results   No results found for this or any previous visit (from the past 240 hour(s)).     Today   Subjective    [name] today has no new complaints, no further emesis, wife at bedside, questions answered, no further diarrhea, no fevers          Patient has been seen and examined prior to discharge   Objective   Blood pressure 127/73, pulse 73, temperature 98.5 F (36.9 C), temperature source Oral, resp. rate 18, height 5\' 7"  (1.702 m), weight 71.2 kg (157 lb), SpO2 92 %.   Intake/Output Summary (Last 24 hours) at 06/01/16 1719 Last data filed at 06/01/16 0429  Gross per 24 hour  Intake          1288.33 ml  Output  0 ml  Net          1288.33 ml    Exam Gen:- Awake  In no apparent distress  HEENT:- Waves.AT,   Neck-Supple Neck,No JVD,  Lungs- mostly clear  CV- S1, S2 normal Abd-  +ve B.Sounds, Abd Soft, No tenderness,    Extremity/Skin:- Intact  peripheral pulses     Data Review   CBC w Diff: Lab Results  Component Value Date   WBC 8.2 05/31/2016   HGB 11.4 (L) 05/31/2016   HCT 35.5 (L) 05/31/2016   PLT 307 05/31/2016   LYMPHOPCT 33.5 12/03/2015   MONOPCT 7.0 12/03/2015   EOSPCT 2.9 12/03/2015   BASOPCT 0.9 12/03/2015    CMP: Lab Results  Component Value Date   NA 137 05/30/2016   K 3.9 05/30/2016   CL 102 05/30/2016   CO2 28 05/30/2016   BUN 10 05/30/2016   CREATININE 0.82 05/30/2016   CREATININE 0.91 08/01/2015   PROT 7.3 05/30/2016   ALBUMIN 4.4 05/30/2016   BILITOT 0.6 05/30/2016   ALKPHOS 67 05/30/2016   AST 20 05/30/2016   ALT 12 (L) 05/30/2016  .   Total Discharge time is about 33 minutes  Abena Erdman M.D on 06/01/2016 at 5:19 PM  [facility]   Office  [phone]  Dragon dictation system was used to create this note, attempts have been made to correct errors, however presence of uncorrected errors is not a reflection quality of care provided

## 2016-06-01 NOTE — ED Notes (Signed)
c/o restless legs, h/o same, EDP into room. Pt ambulatory around department with EDP, steady gait, tolerated well, NAD, calm, interactive, no dyspnea noted, nv improved.

## 2016-06-01 NOTE — ED Triage Notes (Signed)
D/C for [facility] today after 2 day admission,  dx with enteritis. States he developed fever and diarrhea since he has been home.

## 2016-06-01 NOTE — ED Provider Notes (Signed)
[facility] DEPT [facility] Provider Note   CSN: [id number] Arrival date & time: 06/01/16  2047   By signing my name below, I, [name], attest that this documentation has been prepared under the direction and in the presence of Leo Grosser, MD. Electronically Signed: Soijett Fritz, ED Scribe. 06/01/16. 9:35 PM.  History   Chief Complaint Chief Complaint  Patient presents with  . Diarrhea    HPI [name] is a 71 y.o. male with a PMHx of HTN, who presents to the Emergency Department complaining of diarrhea x 20 episodes onset 3 days ago. Pt reports associated fever, abdominal pain, and nausea. Pt has tried zofran with no relief of his symptoms. He states that he was discharged from [facility] today after a 2 day admission and dx with enteritis with no abx Rx. He denies vomiting and any other symptoms.  The history is provided by the patient and the spouse. No language interpreter was used.    Past Medical History:  Diagnosis Date  . Adjustment reaction with physical symptoms   . Cancer, skin, squamous cell    R shoulder, removed per derm 2012  . Coronary artery disease    a. 05/2015 c/p post-op surgery; b. 07/2015 MV EF 53%, basal inferoseptal, mid inferoseptal, and apical inferior mild ischemia;  c. 08/2015 Cath/PCI: LM nl, LAD 29m (3.5x20 Promus DES), 36m, LCX nl, RCA nl, EF 60%.  . Elevated PSA    h/o with normal bx per URO  . GERD (gastroesophageal reflux disease)   . History of BPH   . Hyperlipidemia   . Hypertension   . Nephrolithiasis   . OA (osteoarthritis)    with r hip replacement  . Osteoarthrosis involving, or with mention of more than one site, but not specified as generalized, site unspecified(715.80)   . Syncope    negative cardiac w/u per Dr Irish Lack  . Tick bite 2011   likely tick associated illness, treated with doxy with resolution,  . Viral exanthem, unspecified     Patient Active Problem List   Diagnosis Date Noted  . Acute gastroenteritis  05/30/2016  . Viral gastroenteritis 05/30/2016  . Flu-like symptoms 04/16/2016  . Dry skin 03/13/2016  . Encounter for counseling for travel 03/13/2016  . CAD (coronary artery disease) 08/09/2015  . Abnormal stress test   . OA (osteoarthritis) of hip 05/02/2015  . S/P total hip arthroplasty 05/02/2015  . Chest pain at rest 05/02/2015  . Muscle cramps 11/20/2014  . RLS (restless legs syndrome) 06/30/2014  . Golfer's elbow 06/30/2014  . Advance care planning 11/04/2013  . Insomnia 11/16/2011  . Medicare annual wellness visit, subsequent 09/17/2011  . FH: prostate cancer 09/17/2011  . Biceps muscle tear 09/13/2010  . GERD 09/25/2009  . HLD (hyperlipidemia) 09/06/2009  . ADJUSTMENT REACTION WITH PHYSICAL SYMPTOMS 09/06/2009  . Essential hypertension 09/06/2009  . Osteoarthritis of multiple joints 09/06/2009  . BPH (benign prostatic hyperplasia) 09/06/2009    Past Surgical History:  Procedure Laterality Date  . ACHILLES TENDON REPAIR  1984  . CARDIAC CATHETERIZATION N/A 08/08/2015   Procedure: Left Heart Cath and Coronary Angiography;  Surgeon: Jerline Pain, MD;  Location: [place] [place] [facility];  Service: Cardiovascular;  Laterality: N/A;  . CARDIAC CATHETERIZATION N/A 08/08/2015   Procedure: Coronary Stent Intervention;  Surgeon: Jettie Booze, MD;  Location: [place] [facility];  Service: Cardiovascular;  Laterality: N/A;  . CORONARY STENT PLACEMENT  08/08/2015   MID LAD WITH DES  . JOINT REPLACEMENT    .  SHOULDER ARTHROSCOPY  2012   R rotator cuff (biceps tear not repaired)  . SKIN CANCER EXCISION     right shoulder  . TOTAL HIP ARTHROPLASTY     right  . TOTAL HIP ARTHROPLASTY Left 05/02/2015   Procedure: TOTAL LEFT HIP ARTHROPLASTY ANTERIOR APPROACH;  Surgeon: Gaynelle Arabian, MD;  Location: [facility];  Service: Orthopedics;  Laterality: Left;       Home Medications    Prior to Admission medications   Medication Sig Start Date End Date Taking? Authorizing Provider    Alcaftadine 0.25 % SOLN Place 1 drop into both eyes daily as needed (pain).    Historical Provider, MD  aspirin EC 81 MG tablet Take 81 mg by mouth daily after lunch.     Historical Provider, MD  clopidogrel (PLAVIX) 75 MG tablet Take 1 tablet (75 mg total) by mouth daily with breakfast. 03/31/16   Tonia Ghent, MD  diazepam (VALIUM) 5 MG tablet Take 0.5-1 tablets (2.5-5 mg total) by mouth at bedtime as needed for anxiety (and for insomnia). 04/10/16   Tonia Ghent, MD  hydrocortisone cream 1 % Apply 1 application topically daily as needed for itching.    Historical Provider, MD  nitroGLYCERIN (NITROSTAT) 0.4 MG SL tablet Place 1 tablet (0.4 mg total) under the tongue every 5 (five) minutes as needed for chest pain. 08/09/15   Rogelia Mire, NP  ondansetron (ZOFRAN) 4 MG tablet Take 1 tablet (4 mg total) by mouth every 6 (six) hours as needed for nausea. 06/01/16   Courage Denton Brick, MD  pantoprazole (PROTONIX) 40 MG tablet Take 1 tablet (40 mg total) by mouth daily. 03/31/16   Tonia Ghent, MD  SIMPLY SALINE NA Place 1 spray into both nostrils daily.    Historical Provider, MD  traMADol (ULTRAM) 50 MG tablet Take 1-2 tablets (50-100 mg total) by mouth every 8 (eight) hours as needed for moderate pain or severe pain. 01/18/16   Tonia Ghent, MD    Family History Family History  Problem Relation Age of Onset  . Alzheimer's disease Father   . Arrhythmia Mother   . Prostate cancer Maternal Uncle   . Colon cancer Neg Hx   . Heart attack Neg Hx   . Hypertension Neg Hx     Social History Social History  Substance Use Topics  . Smoking status: Former Smoker    Packs/day: 1.00    Types: Cigarettes    Quit date: 04/12/1984  . Smokeless tobacco: Never Used  . Alcohol use No     Allergies   Xarelto [rivaroxaban]; Alprazolam; Doxycycline; Hydrocodone; Ibuprofen; Lipitor [atorvastatin]; Tamsulosin; Zolpidem tartrate; Crestor [rosuvastatin calcium]; and Zetia [ezetimibe]   Review of  Systems Review of Systems  All other systems reviewed and are negative.    Physical Exam Updated Vital Signs BP (!) 144/94 (BP Location: Left Arm)   Pulse 88   Temp 99.8 F (37.7 C) (Oral)   Resp (!) 21   SpO2 98%   Physical Exam  Constitutional: He is oriented to person, place, and time. He appears well-developed and well-nourished. No distress.  HENT:  Head: Normocephalic and atraumatic.  Nose: Nose normal.  Eyes: Conjunctivae are normal.  Neck: Neck supple. No tracheal deviation present.  Cardiovascular: Normal rate, regular rhythm and normal heart sounds.  Exam reveals no gallop and no friction rub.   No murmur heard. Pulmonary/Chest: Effort normal and breath sounds normal. No respiratory distress. He has no wheezes. He has no rales.  Abdominal: Soft. He exhibits no distension. There is generalized tenderness. There is no rebound and no guarding.   Diffuse mild non-focal abdominal tenderness. No rebound or guarding.  Neurological: He is alert and oriented to person, place, and time.  Skin: Skin is warm and dry.  Psychiatric: He has a normal mood and affect.  Nursing note and vitals reviewed.    ED Treatments / Results  DIAGNOSTIC STUDIES: Oxygen Saturation is 98% on RA, nl by my interpretation.    COORDINATION OF CARE: 9:34 PM Discussed treatment plan with pt at bedside which includes labs, GI cocktail, and pt agreed to plan.   Labs (all labs ordered are listed, but only abnormal results are displayed) Labs Reviewed  CBC WITH DIFFERENTIAL/PLATELET - Abnormal; Notable for the following:       Result Value   Hemoglobin 12.5 (*)    HCT 37.7 (*)    All other components within normal limits  COMPREHENSIVE METABOLIC PANEL - Abnormal; Notable for the following:    BUN 5 (*)    ALT 12 (*)    All other components within normal limits  GASTROINTESTINAL PANEL BY PCR, STOOL (REPLACES STOOL CULTURE)  TROPONIN I    EKG  EKG Interpretation  Date/Time:  Sunday June 01 2016 23:05:18 EDT Ventricular Rate:  75 PR Interval:    QRS Duration: 98 QT Interval:  399 QTC Calculation: 446 R Axis:   36 Text Interpretation:  Sinus rhythm Interpretation limited secondary to artifact Otherwise normal ECG No significant change since last tracing Confirmed by Auria Mckinlay MD, Faustino Luecke ([id number]) on 06/01/2016 11:08:47 PM       Radiology No results found.  Procedures Procedures (including critical care time)  Medications Ordered in ED Medications  sodium chloride 0.9 % bolus 1,000 mL (0 mLs Intravenous Stopped 06/01/16 2218)  acetaminophen (TYLENOL) tablet 1,000 mg (1,000 mg Oral Given 06/01/16 2146)  gi cocktail (Maalox,Lidocaine,Donnatal) (30 mLs Oral Given 06/01/16 2146)  famotidine (PEPCID) IVPB 20 mg premix (0 mg Intravenous Stopped 06/01/16 2219)  promethazine (PHENERGAN) injection 12.5 mg (12.5 mg Intravenous Given 06/01/16 2307)  ketorolac (TORADOL) 30 MG/ML injection 30 mg (30 mg Intravenous Given 06/01/16 2308)    Initial Impression / Assessment and Plan / ED Course  I have reviewed the triage vital signs and the nursing notes.  Pertinent labs & imaging results that were available during my care of the patient were reviewed by me and considered in my medical decision making (see chart for details).     70  year old male presents with diffuse abdominal cramping and pain that has been ongoing for the last 3-4 days. He was recently admitted with viral gastroenteritis and treated with supportive care and recommended follow-up with primary care physician. After leaving this morning he continued to have diarrhea and ongoing nausea but has not vomited. Labs redrawn here for evaluation and are reassuring. He was provided supportive care measures and vomited only once during his emergency department stay. GI pathogen panel was sent from stool. He is currently afebrile with stable vital signs and has no clinical signs of significant dehydration. He does tell me that he has some  intermittent left-sided chest pain and has known coronary artery disease so a troponin was sent for screening. EKG is unremarkable. He has not been taking anything for his nausea or diarrhea so recommended scheduled Pepto-Bismol which I explained would give him very dark colored stool which is normal. Plan to follow up with PCP as needed and return precautions discussed for worsening  or new concerning symptoms.   Final Clinical Impressions(s) / ED Diagnoses   Final diagnoses:  Viral gastroenteritis    New Prescriptions New Prescriptions   BISMUTH SUBSALICYLATE (PEPTO-BISMOL) 262 MG/15ML SUSPENSION    Take 30 mLs by mouth 4 (four) times daily -  before meals and at bedtime.   I personally performed the services described in this documentation, which was scribed in my presence. The recorded information has been reviewed and is accurate.     Leo Grosser, MD 06/02/16 [phone]

## 2016-06-02 ENCOUNTER — Telehealth: Payer: Self-pay | Admitting: *Deleted

## 2016-06-02 ENCOUNTER — Encounter: Payer: Self-pay | Admitting: Family Medicine

## 2016-06-02 LAB — GASTROINTESTINAL PANEL BY PCR, STOOL (REPLACES STOOL CULTURE)
ASTROVIRUS: NOT DETECTED
Adenovirus F40/41: NOT DETECTED
CYCLOSPORA CAYETANENSIS: NOT DETECTED
Campylobacter species: NOT DETECTED
Cryptosporidium: NOT DETECTED
ENTAMOEBA HISTOLYTICA: NOT DETECTED
ENTEROPATHOGENIC E COLI (EPEC): NOT DETECTED
ENTEROTOXIGENIC E COLI (ETEC): NOT DETECTED
Enteroaggregative E coli (EAEC): NOT DETECTED
Giardia lamblia: NOT DETECTED
Norovirus GI/GII: NOT DETECTED
Plesimonas shigelloides: NOT DETECTED
Rotavirus A: NOT DETECTED
SAPOVIRUS (I, II, IV, AND V): NOT DETECTED
Salmonella species: NOT DETECTED
Shiga like toxin producing E coli (STEC): NOT DETECTED
Shigella/Enteroinvasive E coli (EIEC): NOT DETECTED
VIBRIO CHOLERAE: NOT DETECTED
VIBRIO SPECIES: NOT DETECTED
Yersinia enterocolitica: NOT DETECTED

## 2016-06-02 LAB — TROPONIN I: Troponin I: <0.03 ng/mL (ref <0.03)

## 2016-06-02 NOTE — Telephone Encounter (Signed)
Spoke to patient's wife and was advised that patient had to go to the ER last night and patient was given fluids.  Mrs. Robbins stated that patient is resting now because he has not gotten much sleep in the last few days. Mrs. Graca stated that they were told to schedule a follow-up appointment with Dr. [name]. Appointment scheduled for 06/04/16 at 5:00.

## 2016-06-02 NOTE — Telephone Encounter (Signed)
[facility] Patient Name: [name] Gender: Male DOB: [birth year] Age: 71 Y 11 M 16 D Return Phone Number: [phone] (Primary) City/State/Zip: [place] Client [place] Night - Client Client Site [place] Physician Renford Dills - MD Who Is Calling Patient / Member / Family / Caregiver Call Type Triage / Clinical Caller Name Hoyle Sauer Relationship To Patient Spouse Return Phone Number [phone] (Primary) Chief Complaint Diarrhea Reason for Call Symptomatic / Request for Health Information Initial Comment Caller's husband was in hospital since Friday, has intestinal virus, has diarrhea. Now he has a fever of 99.4 Nurse Assessment Nurse: Cherre Robins, RN, Ria Comment Date/Time (Eastern Time): 06/01/2016 7:34:00 PM Confirm and document reason for call. If symptomatic, describe symptoms. ---Caller states her husband was in the hospital from Friday until today- was d/c today and was dx'd with an intestinal virus and he is still having the diarrhea and now he has a temperature of 99.8 across the forehead. Does the PT have any chronic conditions? (i.e. diabetes, asthma, etc.) ---Yes List chronic conditions. ---arthritis. and stent in heart Guidelines Guideline Title Affirmed Question Diarrhea [1] SEVERE diarrhea (e.g., 7 or more times / day more than normal) AND [2] age > 71 years 72. Time Eilene Ghazi Time) Disposition Final User 06/01/2016 7:47:37 PM See Physician within 4 Hours (or PCP triage) Yes Weiss-Hilton, RN, Ria Comment Referrals GO TO FACILITY UNDECIDED Care Advice Given Per Guideline SEE PHYSICIAN WITHIN 4 HOURS (or PCP triage): * IF OFFICE WILL BE CLOSED AND NO PCP TRIAGE: You need to be seen within the next 3 or 4 hours. A nearby Urgent Care Center is often a good source of care. Another choice is to go to the ER. Go sooner if you  become worse. CLEAR FLUIDS: * Drink more fluids. * Sip water or a half-strength sports drink (Gatorade or Powerade; mix half and half with water) * Other options: oral rehydration solution (Pedialyte or Rehydralyte) . CALL BACK IF: *

## 2016-06-02 NOTE — Telephone Encounter (Signed)
Thanks

## 2016-06-02 NOTE — Telephone Encounter (Signed)
Please get update on patient.  Thanks. 

## 2016-06-02 NOTE — ED Notes (Signed)
Pt ambulating in department, for comfort, steady gait, wife at side.

## 2016-06-03 ENCOUNTER — Telehealth: Payer: Self-pay | Admitting: *Deleted

## 2016-06-03 NOTE — Telephone Encounter (Signed)
Lm requesting return call to complete TCM and confirm hosp f/u appt  

## 2016-06-04 ENCOUNTER — Ambulatory Visit (INDEPENDENT_AMBULATORY_CARE_PROVIDER_SITE_OTHER): Payer: Medicare Other | Admitting: Family Medicine

## 2016-06-04 ENCOUNTER — Encounter: Payer: Self-pay | Admitting: Family Medicine

## 2016-06-04 VITALS — BP 102/68 | HR 68 | Temp 98.7°F | Wt 157.5 lb

## 2016-06-04 DIAGNOSIS — K529 Noninfective gastroenteritis and colitis, unspecified: Secondary | ICD-10-CM

## 2016-06-04 DIAGNOSIS — I2583 Coronary atherosclerosis due to lipid rich plaque: Secondary | ICD-10-CM

## 2016-06-04 DIAGNOSIS — R21 Rash and other nonspecific skin eruption: Secondary | ICD-10-CM

## 2016-06-04 DIAGNOSIS — I2510 Atherosclerotic heart disease of native coronary artery without angina pectoris: Secondary | ICD-10-CM

## 2016-06-04 NOTE — Patient Instructions (Signed)
Gradually advance your diet.  Start with bland nondairy foods.   BRAT then gradually eat real food.  Use otc hydrocortisone on your back.  Take care.  Glad to see you.

## 2016-06-04 NOTE — Progress Notes (Signed)
Admission date:  05/30/2016  Admitting Physician  Charlynne Cousins, MD  Discharge Date:  06/01/2016   Primary MD  Elsie Stain, MD  Admission Diagnosis  Viral gastroenteritis [A08.4]  Discharge Diagnosis  Viral gastroenteritis [A08.4]   Principal Problem:   Acute gastroenteritis Active Problems:   Essential hypertension   GERD   Viral gastroenteritis      Past Medical History:  Diagnosis Date  . Adjustment reaction with physical symptoms   . Cancer, skin, squamous cell    R shoulder, removed per derm 2012  . Coronary artery disease    a. 05/2015 c/p post-op surgery; b. 07/2015 MV EF 53%, basal inferoseptal, mid inferoseptal, and apical inferior mild ischemia;  c. 08/2015 Cath/PCI: LM nl, LAD 52m (3.5x20 Promus DES), 30m, LCX nl, RCA nl, EF 60%.  . Elevated PSA    h/o with normal bx per URO  . GERD (gastroesophageal reflux disease)   . History of BPH   . Hyperlipidemia   . Hypertension   . Nephrolithiasis   . OA (osteoarthritis)    with r hip replacement  . Osteoarthrosis involving, or with mention of more than one site, but not specified as generalized, site unspecified(715.80)   . Syncope    negative cardiac w/u per Dr Irish Lack  . Tick bite 2011   likely tick associated illness, treated with doxy with resolution,  . Viral exanthem, unspecified          Past Surgical History:  Procedure Laterality Date  . ACHILLES TENDON REPAIR  1984  . CARDIAC CATHETERIZATION N/A 08/08/2015   Procedure: Left Heart Cath and Coronary Angiography;  Surgeon: Jerline Pain, MD;  Location: [place];  Service: Cardiovascular;  Laterality: N/A;  . CARDIAC CATHETERIZATION N/A 08/08/2015   Procedure: Coronary Stent Intervention;  Surgeon: Jettie Booze, MD;  Location: [place] [facility];  Service: Cardiovascular;  Laterality: N/A;  . CORONARY STENT PLACEMENT  08/08/2015   MID LAD WITH DES  . JOINT REPLACEMENT    . SHOULDER ARTHROSCOPY  2012    R rotator cuff (biceps tear not repaired)  . SKIN CANCER EXCISION     right shoulder  . TOTAL HIP ARTHROPLASTY     right  . TOTAL HIP ARTHROPLASTY Left 05/02/2015   Procedure: TOTAL LEFT HIP ARTHROPLASTY ANTERIOR APPROACH;  Surgeon: Gaynelle Arabian, MD;  Location: [facility];  Service: Orthopedics;  Laterality: Left;       HPI  from the history and physical done on the day of admission:   HPI: [name] a 71 y.o.malewith medical history significant of past medical history of coronary artery C status post drug-eluting stent to the LAD in 08/08/2015 due to abnormal stress test comes into the hospital for abdominal pain nausea and vomiting and diarrhea. He relates today prior to admission he was eating some pizza and 2 hours after that he felt his stomach little bit upset at home and overnight started throwing up about 5 or 6 time systolic having watery diarrhea with no blood. He relates he continues to have abdominal pain no radiation nothing makes it better or worse his cramping intermittently.  ED Course: He is found to be with a mild leukocytosis UA is clean CT scan of the abdomen shows mild thickening of the colon.   Hospital Course:   1)Intractable nausea vomiting with abdominal pain -  Resolved, in retrospect patient probably had a viral gastroenteritis,  CT abdomen and pelvic findings noted . Patient was treated  with IV fluids, antiemetics  2)h/o CAD- last angioplasty and stenting was June 2017, continue aspirin and Plavix, last known EF was 53%. No chest pains or ACS type symptoms at this time   Discharge Condition: Stable ================================================= Admitted with vomiting and diarrhea. CT discussed with patient. Likely colitis. GI pathogen panel negative. Likely viral gastroenteritis. Treated with IV fluids. Discharged home. Required follow-up ER visit. Troponin negative. Able to be discharged home. Here for follow-up. In the meantime,  no fevers, vomiting, diarrhea. BRAT diet in the meantime. Able tolerate that. Asking about options. Hospital course discussed with patient.  He had cardiology f/u and the plan is to stop aspirin later in the summer.  No chest pain. Discussed with patient.  Meds, vitals, and allergies reviewed.   ROS: Per HPI unless specifically indicated in ROS section   GEN: nad, alert and oriented HEENT: mucous membranes moist NECK: supple w/o LA CV: rrr.  no murmur PULM: ctab, no inc wob ABD: soft, +bs EXT: no edema SKIN: Faint irregular nondermatomal blanching maculopapular rash on the back.

## 2016-06-04 NOTE — Assessment & Plan Note (Signed)
See cardiology notes about aspirin and Plavix. Doing well.

## 2016-06-04 NOTE — Assessment & Plan Note (Signed)
Likely viral, resolving, nontoxic. Okay for outpatient follow-up. Advance diet as tolerated. Imaging and labs discussed with patient. Well-appearing.  The rash on his back is likely unremarkable. I suspect this was related to extended time in the bed with recent illness. It itches slightly and he can use hydrocortisone topically as needed. He agrees.

## 2016-06-04 NOTE — Progress Notes (Signed)
Pre visit review using our clinic review tool, if applicable. No additional management support is needed unless otherwise documented below in the visit note. 

## 2016-06-05 ENCOUNTER — Other Ambulatory Visit: Payer: Self-pay

## 2016-06-05 NOTE — Telephone Encounter (Signed)
Last rx 01-18-16 #540/1 Last OV yesterday Next OV 12-18-16  Form for [facility] in Dr Josefine Class inbox to sign and fax back

## 2016-06-06 MED ORDER — TRAMADOL HCL 50 MG PO TABS: 50.0000 mg | ORAL_TABLET | Freq: Three times a day (TID) | ORAL | 1 refills | Status: DC | PRN

## 2016-06-06 NOTE — Telephone Encounter (Signed)
Form done.  Thanks.  Please send in.   

## 2016-06-06 NOTE — Telephone Encounter (Signed)
Form faxed to Pharmacy 

## 2016-06-17 ENCOUNTER — Other Ambulatory Visit: Payer: Self-pay | Admitting: Family Medicine

## 2016-06-17 NOTE — Telephone Encounter (Signed)
Received refill electronically Last office visit 06/04/08 Medication is not on medication list

## 2016-06-18 ENCOUNTER — Encounter: Payer: Self-pay | Admitting: Family Medicine

## 2016-06-18 NOTE — Telephone Encounter (Signed)
Please verify with patient.  I didn't think he was still on med.  Thanks.

## 2016-06-18 NOTE — Telephone Encounter (Signed)
He said he would like a refill on this rx due to having an episode of restless leg after the hospital visit, it also helps him sleep.

## 2016-06-18 NOTE — Telephone Encounter (Signed)
Sent. Thanks. Update me if not better. Thanks.

## 2016-06-18 NOTE — Telephone Encounter (Signed)
Patient notified as instructed by telephone and verbalized understanding. 

## 2016-06-22 ENCOUNTER — Encounter: Payer: Self-pay | Admitting: Family Medicine

## 2016-06-23 ENCOUNTER — Encounter: Payer: Self-pay | Admitting: Family Medicine

## 2016-06-24 ENCOUNTER — Other Ambulatory Visit: Payer: Self-pay | Admitting: Family Medicine

## 2016-06-24 MED ORDER — AMLODIPINE BESYLATE 10 MG PO TABS: 10.0000 mg | ORAL_TABLET | Freq: Every day | ORAL | Status: DC

## 2016-06-24 MED ORDER — DIAZEPAM 5 MG PO TABS: 5.0000 mg | ORAL_TABLET | Freq: Two times a day (BID) | ORAL | 1 refills | Status: DC | PRN

## 2016-06-25 ENCOUNTER — Encounter: Payer: Self-pay | Admitting: Family Medicine

## 2016-06-26 ENCOUNTER — Other Ambulatory Visit: Payer: Self-pay | Admitting: Family Medicine

## 2016-06-26 ENCOUNTER — Telehealth: Payer: Self-pay | Admitting: *Deleted

## 2016-06-26 MED ORDER — DIAZEPAM 5 MG PO TABS: 5.0000 mg | ORAL_TABLET | Freq: Two times a day (BID) | ORAL | 0 refills | Status: DC | PRN

## 2016-06-26 NOTE — Progress Notes (Signed)
Please call in locally. Thanks.  Medication phoned to pharmacy, Diazepam.  [place], CMA  06/26/16.

## 2016-06-26 NOTE — Telephone Encounter (Signed)
Received fax from [facility] concerning Diazepam and Tramadol use.  PPW placed in Dr. Josefine Class [place].

## 2016-06-27 NOTE — Telephone Encounter (Signed)
Has tolerated both. Okay to continue. I will look at the hardcopy. Thanks.

## 2016-06-30 NOTE — Telephone Encounter (Signed)
Form faxed and scanned.

## 2016-07-23 ENCOUNTER — Other Ambulatory Visit: Payer: Self-pay | Admitting: Family Medicine

## 2016-08-04 ENCOUNTER — Encounter: Payer: Self-pay | Admitting: Cardiology

## 2016-08-16 ENCOUNTER — Encounter: Payer: Self-pay | Admitting: Family Medicine

## 2016-08-20 LAB — HM COLONOSCOPY

## 2016-08-22 ENCOUNTER — Encounter: Payer: Self-pay | Admitting: Family Medicine

## 2016-08-22 ENCOUNTER — Ambulatory Visit (INDEPENDENT_AMBULATORY_CARE_PROVIDER_SITE_OTHER): Payer: Medicare Other | Admitting: Family Medicine

## 2016-08-22 DIAGNOSIS — G2581 Restless legs syndrome: Secondary | ICD-10-CM | POA: Diagnosis not present

## 2016-08-22 DIAGNOSIS — M158 Other polyosteoarthritis: Secondary | ICD-10-CM | POA: Diagnosis not present

## 2016-08-22 DIAGNOSIS — F419 Anxiety disorder, unspecified: Secondary | ICD-10-CM

## 2016-08-22 MED ORDER — ESCITALOPRAM OXALATE 5 MG PO TABS: 5.0000 mg | ORAL_TABLET | Freq: Every day | ORAL | Status: DC

## 2016-08-22 MED ORDER — DIAZEPAM 5 MG PO TABS: 5.0000 mg | ORAL_TABLET | Freq: Two times a day (BID) | ORAL | Status: DC | PRN

## 2016-08-22 NOTE — Progress Notes (Signed)
He is off ASA after discussion with cards.  No NTG use.  Compliant with meds.    He is taking other meds at baseline.  Requip helped with PM leg sx.   Tramadol helps his joint aches w/o ADE. D/w pt.    D/w pt about meat allergy after tick bites- this is possible but he doesn't have that issue.  D/w pt.    He still has seb cyst on back.  Not red.  No intervention needed, discussed with patient.  Anxiety.  He has family stressors.  Some of his church members have died and that is a strain for him.  He is taking valium at baseline w/o ADE, up to 10mg  at a dose but rarely taking 10mg  per dose.  Usually taking 5mg  per dose.  He had attempted tx with lexapro prev, he quit it back in the fall.   No clear allergy.   PMH and SH reviewed  ROS: Per HPI unless specifically indicated in ROS section   Meds, vitals, and allergies reviewed.   GEN: nad, alert and oriented HEENT: mucous membranes moist NECK: supple w/o LA CV: rrr.  PULM: ctab, no inc wob ABD: soft, +bs EXT: no edema SKIN: no acute rash but sebaceous cyst noted on the back, not tender, not inflamed.

## 2016-08-22 NOTE — Patient Instructions (Signed)
Don't change your regular meds for now but add on lexapro 5mg  a day.  Keep taking 1 valium twice a day as needed.  Use the least amount possible.  You may be able to taper than down to 1/2 tab a day.   You may need less valium as you go along.   Take care.  Glad to see you.

## 2016-08-24 DIAGNOSIS — F419 Anxiety disorder, unspecified: Secondary | ICD-10-CM | POA: Insufficient documentation

## 2016-08-24 NOTE — Assessment & Plan Note (Signed)
Improved with tramadol, no adverse effect. Continue as is. He agrees.

## 2016-08-24 NOTE — Assessment & Plan Note (Signed)
Multiple stressors noted. No suicidal nor homicidal intent. Okay for outpatient follow-up. Discussed with patient about options. Continue when necessary benzodiazepine use. Try to limit as much as possible. Restart Lexapro, try to wean down benzodiazepine use when he has affect for Lexapro. Update me as needed. We are only trying to make 1 change in his medications at a time to limit any confounding factors. He agrees. >25 minutes spent in face to face time with patient, >50% spent in counselling or coordination of care.

## 2016-08-24 NOTE — Assessment & Plan Note (Signed)
No nitroglycerin use, no chest pain. He is off aspirin. Compliant with medications. Doing well.

## 2016-08-24 NOTE — Assessment & Plan Note (Signed)
Improved with Requip. Continue as is.

## 2016-08-26 ENCOUNTER — Encounter: Payer: Self-pay | Admitting: Family Medicine

## 2016-08-28 ENCOUNTER — Encounter: Payer: Self-pay | Admitting: Family Medicine

## 2016-08-31 ENCOUNTER — Other Ambulatory Visit: Payer: Self-pay | Admitting: Family Medicine

## 2016-08-31 DIAGNOSIS — F419 Anxiety disorder, unspecified: Secondary | ICD-10-CM

## 2016-09-04 ENCOUNTER — Encounter: Payer: Self-pay | Admitting: Family Medicine

## 2016-09-07 ENCOUNTER — Other Ambulatory Visit: Payer: Self-pay | Admitting: Family Medicine

## 2016-09-29 ENCOUNTER — Other Ambulatory Visit: Payer: Self-pay | Admitting: *Deleted

## 2016-09-29 NOTE — Telephone Encounter (Signed)
Faxed refill request. Escitalopram Last office visit:   08/22/16 Last Filled:   08/22/2016  ? Quantity and refills. Please advise.

## 2016-09-30 MED ORDER — ESCITALOPRAM OXALATE 5 MG PO TABS: 5.0000 mg | ORAL_TABLET | Freq: Every day | ORAL | 3 refills | Status: DC

## 2016-09-30 NOTE — Telephone Encounter (Signed)
Sent. Thanks.   

## 2016-11-05 ENCOUNTER — Encounter: Payer: Self-pay | Admitting: Family Medicine

## 2016-11-08 ENCOUNTER — Other Ambulatory Visit: Payer: Self-pay | Admitting: Family Medicine

## 2016-11-10 ENCOUNTER — Ambulatory Visit: Payer: 59 | Admitting: Psychology

## 2016-11-10 NOTE — Telephone Encounter (Signed)
Received refill electronically Last office visit 06/18/16 #90/1 Last office visit 08/22/16

## 2016-11-11 NOTE — Telephone Encounter (Signed)
sent.  Thanks.

## 2016-11-17 ENCOUNTER — Other Ambulatory Visit: Payer: Self-pay | Admitting: *Deleted

## 2016-11-17 NOTE — Telephone Encounter (Signed)
Faxed refill request.  [facility] Last office visit:   08/22/2016 Last Filled:   Diazepam 08/22/2016, ? Quantity Last Filled:   Tramadol  540 tablet 1 06/06/2016  Please advise.

## 2016-11-18 MED ORDER — TRAMADOL HCL 50 MG PO TABS: 50.0000 mg | ORAL_TABLET | Freq: Three times a day (TID) | ORAL | 1 refills | Status: DC | PRN

## 2016-11-18 MED ORDER — DIAZEPAM 5 MG PO TABS: 5.0000 mg | ORAL_TABLET | Freq: Two times a day (BID) | ORAL | 1 refills | Status: DC | PRN

## 2016-11-18 NOTE — Telephone Encounter (Signed)
Both printed.  Thanks.  Please fax in.

## 2016-11-18 NOTE — Telephone Encounter (Signed)
Faxed to [facility]

## 2016-11-26 ENCOUNTER — Ambulatory Visit (INDEPENDENT_AMBULATORY_CARE_PROVIDER_SITE_OTHER): Payer: Medicare Other | Admitting: Psychology

## 2016-11-26 DIAGNOSIS — F4323 Adjustment disorder with mixed anxiety and depressed mood: Secondary | ICD-10-CM | POA: Diagnosis not present

## 2016-12-07 ENCOUNTER — Other Ambulatory Visit: Payer: Self-pay | Admitting: Family Medicine

## 2016-12-07 DIAGNOSIS — I10 Essential (primary) hypertension: Secondary | ICD-10-CM

## 2016-12-07 DIAGNOSIS — G2581 Restless legs syndrome: Secondary | ICD-10-CM

## 2016-12-07 DIAGNOSIS — Z125 Encounter for screening for malignant neoplasm of prostate: Secondary | ICD-10-CM

## 2016-12-10 ENCOUNTER — Ambulatory Visit (INDEPENDENT_AMBULATORY_CARE_PROVIDER_SITE_OTHER): Payer: Medicare Other

## 2016-12-10 VITALS — BP 102/70 | HR 75 | Temp 98.4°F | Ht 67.5 in | Wt 154.5 lb

## 2016-12-10 DIAGNOSIS — I10 Essential (primary) hypertension: Secondary | ICD-10-CM | POA: Diagnosis not present

## 2016-12-10 DIAGNOSIS — G2581 Restless legs syndrome: Secondary | ICD-10-CM | POA: Diagnosis not present

## 2016-12-10 DIAGNOSIS — Z0000 Encounter for general adult medical examination without abnormal findings: Secondary | ICD-10-CM | POA: Diagnosis not present

## 2016-12-10 DIAGNOSIS — Z125 Encounter for screening for malignant neoplasm of prostate: Secondary | ICD-10-CM | POA: Diagnosis not present

## 2016-12-10 LAB — COMPREHENSIVE METABOLIC PANEL
ALBUMIN: 4.3 g/dL (ref 3.5–5.2)
ALT: 10 U/L (ref 0–53)
AST: 17 U/L (ref 0–37)
Alkaline Phosphatase: 67 U/L (ref 39–117)
BUN: 7 mg/dL (ref 6–23)
CHLORIDE: 100 meq/L (ref 96–112)
CO2: 31 mEq/L (ref 19–32)
Calcium: 9.3 mg/dL (ref 8.4–10.5)
Creatinine, Ser: 0.88 mg/dL (ref 0.40–1.50)
GFR: 90.61 mL/min (ref >60.00)
GLUCOSE: 89 mg/dL (ref 70–99)
POTASSIUM: 4.3 meq/L (ref 3.5–5.1)
SODIUM: 138 meq/L (ref 135–145)
Total Bilirubin: 0.6 mg/dL (ref 0.2–1.2)
Total Protein: 6.8 g/dL (ref 6.0–8.3)

## 2016-12-10 LAB — LIPID PANEL
CHOLESTEROL: 171 mg/dL (ref 0–200)
HDL: 44 mg/dL (ref >39.00)
LDL Cholesterol: 108 mg/dL — ABNORMAL HIGH (ref 0–99)
NONHDL: 127.45
TRIGLYCERIDES: 99 mg/dL (ref 0.0–149.0)
Total CHOL/HDL Ratio: 4
VLDL: 19.8 mg/dL (ref 0.0–40.0)

## 2016-12-10 LAB — CBC WITH DIFFERENTIAL/PLATELET
BASOS ABS: 0.1 10*3/uL (ref 0.0–0.1)
Basophils Relative: 1.2 % (ref 0.0–3.0)
EOS ABS: 0.1 10*3/uL (ref 0.0–0.7)
Eosinophils Relative: 2.4 % (ref 0.0–5.0)
HCT: 39.2 % (ref 39.0–52.0)
Hemoglobin: 12.5 g/dL — ABNORMAL LOW (ref 13.0–17.0)
LYMPHS ABS: 2 10*3/uL (ref 0.7–4.0)
Lymphocytes Relative: 33.5 % (ref 12.0–46.0)
MCHC: 31.9 g/dL (ref 30.0–36.0)
MCV: 85.8 fl (ref 78.0–100.0)
MONOS PCT: 7.7 % (ref 3.0–12.0)
Monocytes Absolute: 0.4 10*3/uL (ref 0.1–1.0)
NEUTROS PCT: 55.2 % (ref 43.0–77.0)
Neutro Abs: 3.2 10*3/uL (ref 1.4–7.7)
Platelets: 329 10*3/uL (ref 150.0–400.0)
RBC: 4.56 Mil/uL (ref 4.22–5.81)
RDW: 15.1 % (ref 11.5–15.5)
WBC: 5.8 10*3/uL (ref 4.0–10.5)

## 2016-12-10 LAB — PSA, MEDICARE: PSA: 5.97 ng/ml — ABNORMAL HIGH (ref 0.10–4.00)

## 2016-12-10 NOTE — Progress Notes (Signed)
I reviewed health advisor's note, was available for consultation, and agree with documentation and plan.   Signed,  Bryer Cozzolino T. Monterius Rolf, MD  

## 2016-12-10 NOTE — Progress Notes (Signed)
Pre visit review using our clinic review tool, if applicable. No additional management support is needed unless otherwise documented below in the visit note. 

## 2016-12-10 NOTE — Progress Notes (Signed)
PCP notes:   Health maintenance:  Flu vaccine - addressed; will have vaccine at church Tetanus - PCP please administer at next appt  Abnormal screenings:   Hearing -failed  Hearing Screening   125Hz  250Hz  500Hz  1000Hz  2000Hz  3000Hz  4000Hz  6000Hz  8000Hz   Right ear:   40 40 40  40    Left ear:   0 0 40  0      Patient concerns:   None  Nurse concerns:  None  Next PCP appt:   10/18 @ 1415

## 2016-12-10 NOTE — Patient Instructions (Signed)
Mr. Kreeger , Thank you for taking time to come for your Medicare Wellness Visit. I appreciate your ongoing commitment to your health goals. Please review the following plan we discussed and let me know if I can assist you in the future.   These are the goals we discussed: Goals    . Increase water intake          Starting 12/10/2016, I will continue to drink at least 6-8 glasses of water daily.        This is a list of the screening recommended for you and due dates:  Health Maintenance  Topic Date Due  . Flu Shot  05/31/2017*  . Tetanus Vaccine  11/19/2026*  . Colon Cancer Screening  08/21/2026  .  Hepatitis C: One time screening is recommended by Center for Disease Control  (CDC) for  adults born from [birth year] through 1965.   Completed  . Pneumonia vaccines  Completed  *Topic was postponed. The date shown is not the original due date.   Preventive Care for Adults  A healthy lifestyle and preventive care can promote health and wellness. Preventive health guidelines for adults include the following key practices.  . A routine yearly physical is a good way to check with your health care provider about your health and preventive screening. It is a chance to share any concerns and updates on your health and to receive a thorough exam.  . Visit your dentist for a routine exam and preventive care every 6 months. Brush your teeth twice a day and floss once a day. Good oral hygiene prevents tooth decay and gum disease.  . The frequency of eye exams is based on your age, health, family medical history, use  of contact lenses, and other factors. Follow your health care provider's ecommendations for frequency of eye exams.  . Eat a healthy diet. Foods like vegetables, fruits, whole grains, low-fat dairy products, and lean protein foods contain the nutrients you need without too many calories. Decrease your intake of foods high in solid fats, added sugars, and salt. Eat the right amount of calories  for you. Get information about a proper diet from your health care provider, if necessary.  . Regular physical exercise is one of the most important things you can do for your health. Most adults should get at least 150 minutes of moderate-intensity exercise (any activity that increases your heart rate and causes you to sweat) each week. In addition, most adults need muscle-strengthening exercises on 2 or more days a week.  Silver Sneakers may be a benefit available to you. To determine eligibility, you may visit the website: www.silversneakers.com or contact program at (936)857-9268 Mon-Fri between 8AM-8PM.   . Maintain a healthy weight. The body mass index (BMI) is a screening tool to identify possible weight problems. It provides an estimate of body fat based on height and weight. Your health care provider can find your BMI and can help you achieve or maintain a healthy weight.   For adults 20 years and older: ? A BMI below 18.5 is considered underweight. ? A BMI of 18.5 to 24.9 is normal. ? A BMI of 25 to 29.9 is considered overweight. ? A BMI of 30 and above is considered obese.   . Maintain normal blood lipids and cholesterol levels by exercising and minimizing your intake of saturated fat. Eat a balanced diet with plenty of fruit and vegetables. Blood tests for lipids and cholesterol should begin at age 72 and be  repeated every 5 years. If your lipid or cholesterol levels are high, you are over 50, or you are at high risk for heart disease, you may need your cholesterol levels checked more frequently. Ongoing high lipid and cholesterol levels should be treated with medicines if diet and exercise are not working.  . If you smoke, find out from your health care provider how to quit. If you do not use tobacco, please do not start.  . If you choose to drink alcohol, please do not consume more than 2 drinks per day. One drink is considered to be 12 ounces (355 mL) of beer, 5 ounces (148 mL) of  wine, or 1.5 ounces (44 mL) of liquor.  . If you are 39-37 years old, ask your health care provider if you should take aspirin to prevent strokes.  . Use sunscreen. Apply sunscreen liberally and repeatedly throughout the day. You should seek shade when your shadow is shorter than you. Protect yourself by wearing long sleeves, pants, a wide-brimmed hat, and sunglasses year round, whenever you are outdoors.  . Once a month, do a whole body skin exam, using a mirror to look at the skin on your back. Tell your health care provider of new moles, moles that have irregular borders, moles that are larger than a pencil eraser, or moles that have changed in shape or color.

## 2016-12-10 NOTE — Progress Notes (Signed)
Subjective:   [name] is a 71 y.o. male who presents for Medicare Annual/Subsequent preventive examination.  Review of Systems:  N/A Cardiac Risk Factors include: advanced age (>17men, >76 women);male gender;dyslipidemia;hypertension     Objective:    Vitals: BP 102/70 (BP Location: Right Arm, Patient Position: Sitting, Cuff Size: Normal)   Pulse 75   Temp 98.4 F (36.9 C) (Oral)   Ht 5' 7.5" (1.715 m) Comment: shoes  Wt 154 lb 8 oz (70.1 kg)   SpO2 96%   BMI 23.84 kg/m   Body mass index is 23.84 kg/m.  Tobacco History  Smoking Status  . Former Smoker  . Packs/day: 1.00  . Types: Cigarettes  . Quit date: 04/12/1984  Smokeless Tobacco  . Never Used     Counseling given: No   Past Medical History:  Diagnosis Date  . Adjustment reaction with physical symptoms   . Cancer, skin, squamous cell    R shoulder, removed per derm 2012  . Coronary artery disease    a. 05/2015 c/p post-op surgery; b. 07/2015 MV EF 53%, basal inferoseptal, mid inferoseptal, and apical inferior mild ischemia;  c. 08/2015 Cath/PCI: LM nl, LAD 63m (3.5x20 Promus DES), 24m, LCX nl, RCA nl, EF 60%.  . Elevated PSA    h/o with normal bx per URO  . GERD (gastroesophageal reflux disease)   . History of BPH   . Hyperlipidemia   . Hypertension   . Nephrolithiasis   . OA (osteoarthritis)    with r hip replacement  . Osteoarthrosis involving, or with mention of more than one site, but not specified as generalized, site unspecified(715.80)   . Syncope    negative cardiac w/u per Dr Irish Lack  . Tick bite 2011   likely tick associated illness, treated with doxy with resolution,  . Viral exanthem, unspecified    Past Surgical History:  Procedure Laterality Date  . ACHILLES TENDON REPAIR  1984  . CARDIAC CATHETERIZATION N/A 08/08/2015   Procedure: Left Heart Cath and Coronary Angiography;  Surgeon: Jerline Pain, MD;  Location: [facility];  Service: Cardiovascular;  Laterality: N/A;  .  CARDIAC CATHETERIZATION N/A 08/08/2015   Procedure: Coronary Stent Intervention;  Surgeon: Jettie Booze, MD;  Location: [facility];  Service: Cardiovascular;  Laterality: N/A;  . CORONARY STENT PLACEMENT  08/08/2015   MID LAD WITH DES  . JOINT REPLACEMENT    . SHOULDER ARTHROSCOPY  2012   R rotator cuff (biceps tear not repaired)  . SKIN CANCER EXCISION     right shoulder  . TOTAL HIP ARTHROPLASTY     right  . TOTAL HIP ARTHROPLASTY Left 05/02/2015   Procedure: TOTAL LEFT HIP ARTHROPLASTY ANTERIOR APPROACH;  Surgeon: Gaynelle Arabian, MD;  Location: [facility];  Service: Orthopedics;  Laterality: Left;   Family History  Problem Relation Age of Onset  . Alzheimer's disease Father   . Arrhythmia Mother   . Prostate cancer Maternal Uncle   . Colon cancer Neg Hx   . Heart attack Neg Hx   . Hypertension Neg Hx    History  Sexual Activity  . Sexual activity: Yes    Outpatient Encounter Prescriptions as of 12/10/2016  Medication Sig  . Alcaftadine 0.25 % SOLN Place 1 drop into both eyes daily as needed (pain).  Marland Kitchen amLODipine (NORVASC) 10 MG tablet TAKE 1 TABLET BY MOUTH  DAILY  . clopidogrel (PLAVIX) 75 MG tablet TAKE 1 TABLET BY MOUTH  DAILY WITH  BREAKFAST  . diazepam (VALIUM) 5 MG tablet Take 1-2 tablets (5-10 mg total) by mouth every 12 (twelve) hours as needed for anxiety (and for insomnia).  Marland Kitchen escitalopram (LEXAPRO) 5 MG tablet Take 1 tablet (5 mg total) by mouth daily.  . hydrocortisone cream 1 % Apply 1 application topically daily as needed for itching.  . nitroGLYCERIN (NITROSTAT) 0.4 MG SL tablet Place 1 tablet (0.4 mg total) under the tongue every 5 (five) minutes as needed for chest pain.  . pantoprazole (PROTONIX) 40 MG tablet TAKE 1 TABLET BY MOUTH  DAILY  . rOPINIRole (REQUIP) 0.5 MG tablet TAKE 1 TABLET BY MOUTH AT  BEDTIME  . SIMPLY SALINE NA Place 1 spray into both nostrils daily.  . traMADol (ULTRAM) 50 MG tablet Take 1-2 tablets (50-100 mg total) by mouth every  8 (eight) hours as needed for moderate pain or severe pain.   No facility-administered encounter medications on file as of 12/10/2016.     Activities of Daily Living In your present state of health, do you have any difficulty performing the following activities: 12/10/2016 05/30/2016  Hearing? 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 N N  Difficulty concentrating or making decisions? N N  Walking or climbing stairs? N N  Dressing or bathing? N N  Doing errands, shopping? N N  Preparing Food and eating ? N -  Using the Toilet? N -  In the past six months, have you accidently leaked urine? N -  Do you have problems with loss of bowel control? N -  Managing your Medications? N -  Managing your Finances? N -  Housekeeping or managing your Housekeeping? N -  Some recent data might be hidden    Patient Care Team: Tonia Ghent, MD as PCP - General Melissa Noon, OD as Referring Physician (Optometry)   Assessment:     Hearing Screening   125Hz  250Hz  500Hz  1000Hz  2000Hz  3000Hz  4000Hz  6000Hz  8000Hz   Right ear:   40 40 40  40    Left ear:   0 0 40  0    Vision Screening Comments: Last vision exam in July 2018 with Dr. Melissa Noon   Exercise Activities and Dietary recommendations Current Exercise Habits: The patient does not participate in regular exercise at present, Exercise limited by: None identified  Goals    . Increase water intake          Starting 12/10/2016, I will continue to drink at least 6-8 glasses of water daily.       Fall Risk Fall Risk  12/10/2016 12/07/2015 11/16/2014 11/03/2013 10/29/2012  Falls in the past year? No No No No No   Depression Screen PHQ 2/9 Scores 12/10/2016 12/07/2015 11/16/2014 11/03/2013  PHQ - 2 Score 0 6 0 2  PHQ- 9 Score 0 - - -    Cognitive Function MMSE - Mini Mental State Exam 12/10/2016  Orientation to time 5  Orientation to Place 5  Registration 3  Attention/ Calculation 0  Recall 3  Language- name 2  objects 0  Language- repeat 1  Language- follow 3 step command 3  Language- read & follow direction 0  Write a sentence 0  Copy design 0  Total score 20       PLEASE NOTE: A Mini-Cog screen was completed. Maximum score is 20. A value of 0 denotes this part of Folstein MMSE was not completed or the patient failed this part of the Mini-Cog  screening.   Mini-Cog Screening Orientation to Time - Max 5 pts Orientation to Place - Max 5 pts Registration - Max 3 pts Recall - Max 3 pts Language Repeat - Max 1 pts Language Follow 3 Step Command - Max 3 pts   Immunization History  Administered Date(s) Administered  . Hep A / Hep B 03/29/2008, 06/07/2008, 11/27/2008  . Influenza Split 12/31/2011, 01/16/2015  . Influenza Whole 11/23/2007, 01/10/2010  . Influenza,inj,Quad PF,6+ Mos 01/12/2016  . Influenza-Unspecified 12/01/2012, 12/15/2013, 01/12/2016  . Pneumococcal Conjugate-13 11/16/2014  . Pneumococcal Polysaccharide-23 12/25/2006, 10/29/2012  . Td 11/20/2006  . Typhoid Parenteral 03/29/2008  . Zoster 03/18/2007   Screening Tests Health Maintenance  Topic Date Due  . INFLUENZA VACCINE  05/31/2017 (Originally 10/01/2016)  . TETANUS/TDAP  11/19/2026 (Originally 11/19/2016)  . COLONOSCOPY  08/21/2026  . Hepatitis C Screening  Completed  . PNA vac Low Risk Adult  Completed      Plan:     I have personally reviewed and addressed the Medicare Annual Wellness questionnaire and have noted the following in the patient's chart:  A. Medical and social history B. Use of alcohol, tobacco or illicit drugs  C. Current medications and supplements D. Functional ability and status E.  Nutritional status F.  Physical activity G. Advance directives H. List of other physicians I.  Hospitalizations, surgeries, and ER visits in previous 12 months J.  [place] to include hearing, vision, cognitive, depression L. Referrals and appointments - none  In addition, I have reviewed and  discussed with patient certain preventive protocols, quality metrics, and best practice recommendations. A written personalized care plan for preventive services as well as general preventive health recommendations were provided to patient.  See attached scanned questionnaire for additional information.   Signed,   Lindell Noe, MHA, BS, LPN Health Coach

## 2016-12-18 ENCOUNTER — Ambulatory Visit (INDEPENDENT_AMBULATORY_CARE_PROVIDER_SITE_OTHER): Payer: Medicare Other | Admitting: Family Medicine

## 2016-12-18 ENCOUNTER — Encounter: Payer: Self-pay | Admitting: Family Medicine

## 2016-12-18 VITALS — BP 120/84 | HR 90 | Temp 98.6°F | Ht 68.0 in | Wt 152.4 lb

## 2016-12-18 DIAGNOSIS — D649 Anemia, unspecified: Secondary | ICD-10-CM | POA: Diagnosis not present

## 2016-12-18 DIAGNOSIS — R972 Elevated prostate specific antigen [PSA]: Secondary | ICD-10-CM | POA: Diagnosis not present

## 2016-12-18 DIAGNOSIS — I10 Essential (primary) hypertension: Secondary | ICD-10-CM

## 2016-12-18 DIAGNOSIS — G2581 Restless legs syndrome: Secondary | ICD-10-CM

## 2016-12-18 DIAGNOSIS — Z23 Encounter for immunization: Secondary | ICD-10-CM

## 2016-12-18 DIAGNOSIS — Z7189 Other specified counseling: Secondary | ICD-10-CM

## 2016-12-18 DIAGNOSIS — Z125 Encounter for screening for malignant neoplasm of prostate: Secondary | ICD-10-CM

## 2016-12-18 DIAGNOSIS — F419 Anxiety disorder, unspecified: Secondary | ICD-10-CM

## 2016-12-18 DIAGNOSIS — M158 Other polyosteoarthritis: Secondary | ICD-10-CM

## 2016-12-18 DIAGNOSIS — Z0000 Encounter for general adult medical examination without abnormal findings: Secondary | ICD-10-CM

## 2016-12-18 MED ORDER — ESCITALOPRAM OXALATE 5 MG PO TABS: 2.5000 mg | ORAL_TABLET | Freq: Two times a day (BID) | ORAL | Status: DC

## 2016-12-18 MED ORDER — DIAZEPAM 5 MG PO TABS: 2.5000 mg | ORAL_TABLET | Freq: Two times a day (BID) | ORAL | Status: DC | PRN

## 2016-12-18 MED ORDER — AMLODIPINE BESYLATE 10 MG PO TABS: 10.0000 mg | ORAL_TABLET | Freq: Every day | ORAL | 3 refills | Status: DC

## 2016-12-18 NOTE — Progress Notes (Signed)
Flu vaccine - will have vaccine at church, d/w pt.   Tetanus - 2018, d/w pt.  PNA up to date.  Shingles d/w pt.  See AVS.  Colonoscopy 2018 Elevated PSA with neg bx prev per uro.  D/w pt about PSA elevation.   Dw pt about options and will recheck PSA in about 6 months.   Living will d/w pt.  Wife designated if patient were incapacitated Hearing -failed.  Declined hearing aids.    Hypertension:    Using medication without problems or lightheadedness: occ lightheaded but only with sudden changes in position, ie jumping up from a sitting position.  Cautions d/w pt.   Chest pain with exertion:no Edema:no Short of breath:no Labs d/w pt.  Statin intolerant.    Mild dec in HGB, d/w pt. Nearly normal.  No blood in stool, no bleeding.    RLS.  Improved with current med.   Anxiety.  Improved with lexapro and valium prn.  No ADE on med.  He and his wife are doing well; he is trying to maintain good relationships with other family members (ie his wife's daughter).  He is helping care for a granddaughter one day a week.    PMH and SH reviewed  ROS: Per HPI unless specifically indicated in ROS section   Meds, vitals, and allergies reviewed.   GEN: nad, alert and oriented HEENT: mucous membranes moist NECK: supple w/o LA CV: rrr PULM: ctab, no inc wob ABD: soft, +bs EXT: no edema SKIN: no acute rash

## 2016-12-18 NOTE — Patient Instructions (Addendum)
I would get a flu shot each fall.   Check with your insurance to see if they will cover the shingrix shot. Take care.  Glad to see you.  Update me as needed.  Reasonable to recheck PSA in about 6 months prior to a visit.

## 2016-12-19 DIAGNOSIS — R972 Elevated prostate specific antigen [PSA]: Secondary | ICD-10-CM | POA: Insufficient documentation

## 2016-12-19 DIAGNOSIS — Z0000 Encounter for general adult medical examination without abnormal findings: Secondary | ICD-10-CM | POA: Insufficient documentation

## 2016-12-19 DIAGNOSIS — D649 Anemia, unspecified: Secondary | ICD-10-CM | POA: Insufficient documentation

## 2016-12-19 DIAGNOSIS — Z01810 Encounter for preprocedural cardiovascular examination: Secondary | ICD-10-CM | POA: Insufficient documentation

## 2016-12-19 NOTE — Assessment & Plan Note (Signed)
Labs discussed with patient. Statin intolerant. Continue work on diet and exercise. He agrees.

## 2016-12-19 NOTE — Assessment & Plan Note (Signed)
Living will d/w pt.  Wife designated if patient were incapacitated.   ?

## 2016-12-19 NOTE — Assessment & Plan Note (Signed)
Symptoms clearly improved with Requip. Continue as is. He agrees. No adverse effect on medication.

## 2016-12-19 NOTE — Assessment & Plan Note (Signed)
Improved with Lexapro and Valium. Continue Lexapro as is. He is taking 2.5 mg twice a day. Discussed with patient about using the least amount of Valium possible. No adverse effect on medication. He agrees with plan.

## 2016-12-19 NOTE — Assessment & Plan Note (Signed)
Flu vaccine - will have vaccine at church, d/w pt.   Tetanus - 2018, d/w pt.  PNA up to date.  Shingles d/w pt.  See AVS.  Colonoscopy 2018 Elevated PSA with neg bx prev per uro.  D/w pt about PSA elevation.   Dw pt about options and will recheck PSA in about 6 months.   Living will d/w pt.  Wife designated if patient were incapacitated Hearing -failed.  Declined hearing aids.

## 2016-12-19 NOTE — Assessment & Plan Note (Signed)
Very mild anemia with hemoglobin at 12.5. No known gross hematuria. No blood in stool. Colonoscopy up to date. No bleeding otherwise. Since hemoglobin is stable, would continue as is and not intervene otherwise. He agrees.

## 2016-12-19 NOTE — Assessment & Plan Note (Signed)
Elevated PSA with neg bx prev per uro.  D/w pt about PSA elevation.   Dw pt about options and will recheck PSA in about 6 months.  He agrees.  No LUTS.

## 2016-12-19 NOTE — Assessment & Plan Note (Signed)
Osteoarthritis pain is tolerable with tramadol. No adverse effect on medication. Continue as is.

## 2016-12-22 ENCOUNTER — Encounter: Payer: Self-pay | Admitting: Family Medicine

## 2016-12-22 IMAGING — DX DG PORTABLE PELVIS
1 series · 1 of 1 positions shown · non-contrast
Comparison: 09/20/2008

CLINICAL DATA: Postop left hip replacement.

EXAM:
PORTABLE PELVIS 1-2 VIEWS

[pelvis ap]
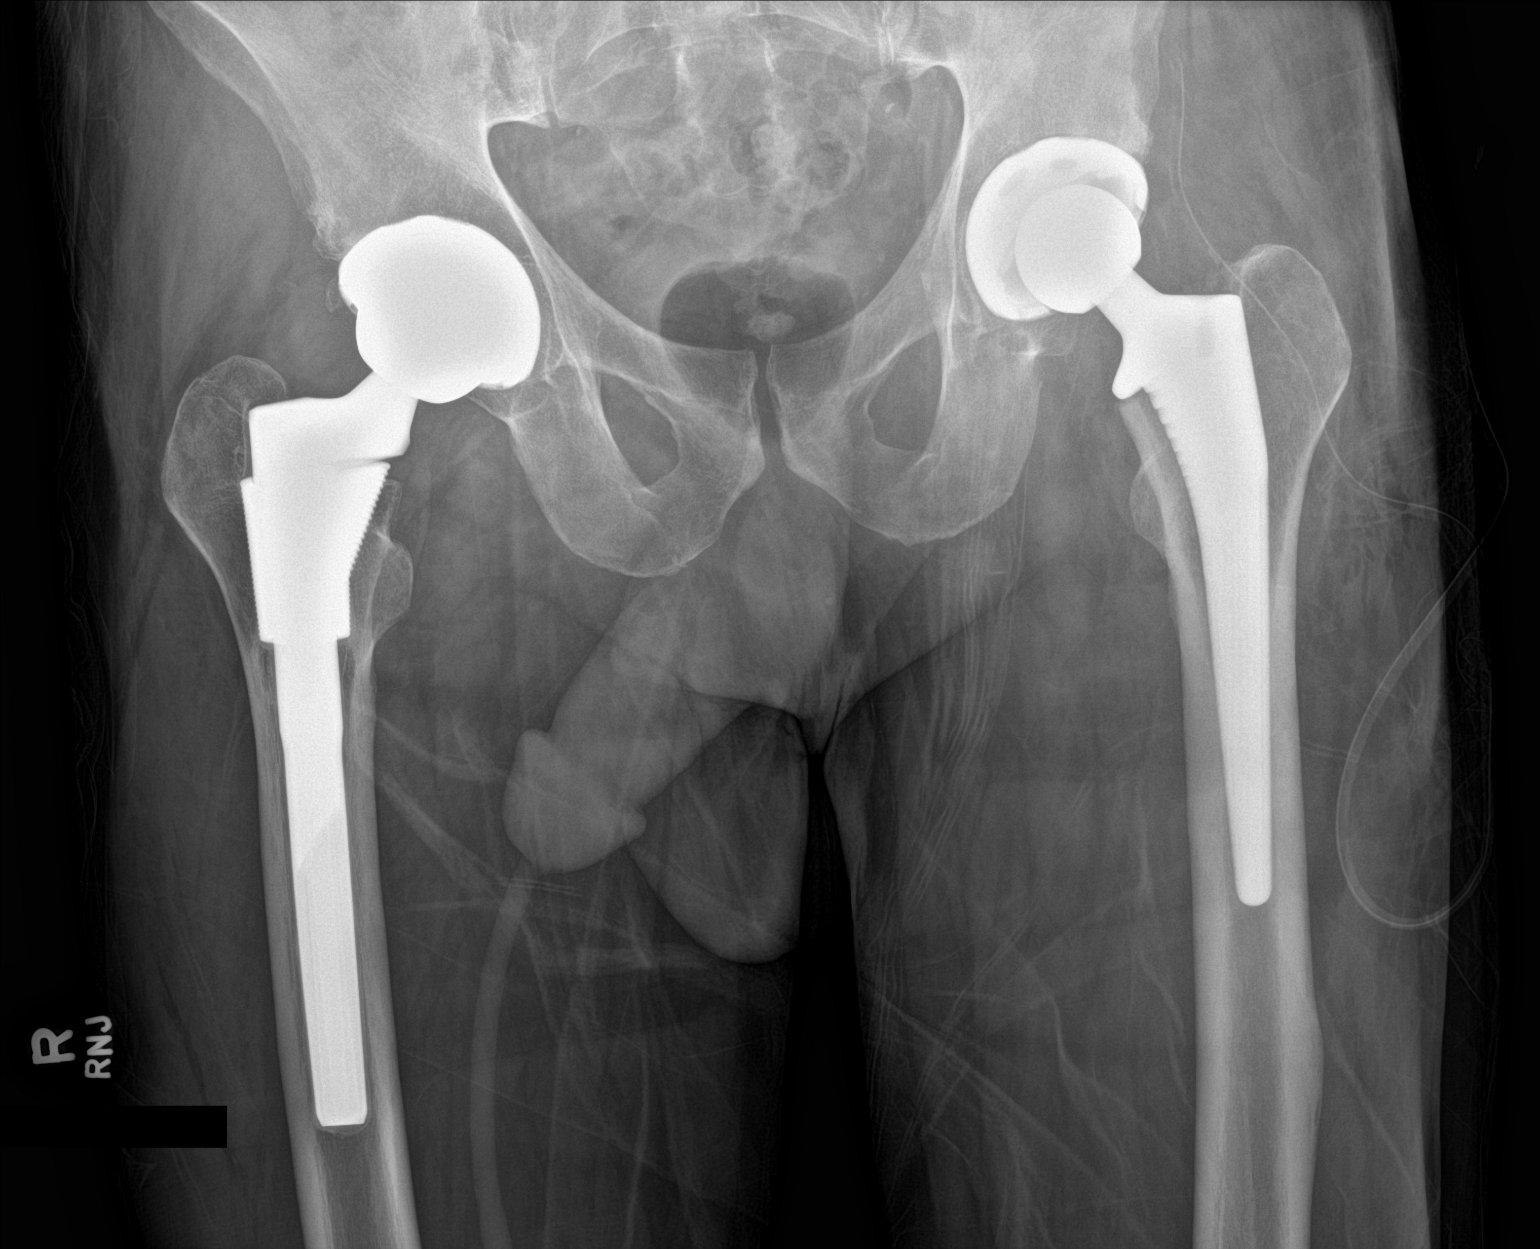

[1 of 1 positions shown; findings below may reference images not displayed]

FINDINGS: Right hip arthroplasty unchanged. Evidence of patient's recent left
total hip arthroplasty with prosthetic components intact and
normally located. Surgical drain over the superior lateral soft
tissues of the left hip. Remainder of the exam is within normal.
IMPRESSION: Evidence of recent left total hip arthroplasty without complicating
features.

## 2017-01-04 ENCOUNTER — Encounter: Payer: Self-pay | Admitting: Family Medicine

## 2017-01-20 ENCOUNTER — Encounter: Payer: Self-pay | Admitting: Family Medicine

## 2017-01-21 ENCOUNTER — Encounter: Payer: Self-pay | Admitting: Family Medicine

## 2017-01-21 ENCOUNTER — Ambulatory Visit: Payer: Self-pay | Admitting: *Deleted

## 2017-01-21 ENCOUNTER — Other Ambulatory Visit: Payer: Self-pay

## 2017-01-21 ENCOUNTER — Encounter: Payer: Self-pay | Admitting: Physician Assistant

## 2017-01-21 ENCOUNTER — Ambulatory Visit (INDEPENDENT_AMBULATORY_CARE_PROVIDER_SITE_OTHER): Payer: Medicare Other | Admitting: Physician Assistant

## 2017-01-21 VITALS — BP 112/80 | HR 78 | Temp 98.3°F | Resp 14 | Ht 68.0 in | Wt 152.0 lb

## 2017-01-21 DIAGNOSIS — J019 Acute sinusitis, unspecified: Secondary | ICD-10-CM

## 2017-01-21 DIAGNOSIS — B9689 Other specified bacterial agents as the cause of diseases classified elsewhere: Secondary | ICD-10-CM

## 2017-01-21 MED ORDER — AMOXICILLIN-POT CLAVULANATE 875-125 MG PO TABS: 1.0000 | ORAL_TABLET | Freq: Two times a day (BID) | ORAL | 0 refills | Status: DC

## 2017-01-21 NOTE — Telephone Encounter (Signed)
Pt  Has  Been taking  Amoxicillin   That  He  Had  On hand    From  Previous   rx  the patient had on hand  Reason for Disposition . [1] Using nasal washes and pain medicine > 24 hours AND [2] sinus pain (around cheekbone or eye) persists  Answer Assessment - Initial Assessment Questions 1. LOCATION: "Where does it hurt?"       Pt   Reports  Had   Some   Pain  Above   And behind r  Eye   And  Teeth  2. ONSET: "When did the sinus pain start?"  (e.g., hours, days)        About  1   Week  Ago   Symptoms  Started  Getting  Better  Last  Pm   3. SEVERITY: "How bad is the pain?"   (Scale 1-10; mild, moderate or severe)   - MILD (1-3): doesn't interfere with normal activities    - MODERATE (4-7): interferes with normal activities (e.g., work or school) or awakens from sleep   - SEVERE (8-10): excruciating pain and patient unable to do any normal activities         Moderate  4. RECURRENT SYMPTOM: "Have you ever had sinus problems before?" If so, ask: "When was the last time?" and "What happened that time?"      History  Of  Sinus  Issues  In  Past  5. NASAL CONGESTION: "Is the nose blocked?" If so, ask, "Can you open it or must you breathe through the mouth?"     Was    Stuffy  But   Better    Now   6. NASAL DISCHARGE: "Do you have discharge from your nose?" If so ask, "What color?"      Had  Some   Yellow colored   Noticed  Slight  Amount   7. FEVER: "Do you have a fever?" If so, ask: "What is it, how was it measured, and when did it start?"      No  known 8. OTHER SYMPTOMS: "Do you have any other symptoms?" (e.g., sore throat, cough, earache, difficulty breathing)      Had   Slight  sorethroat  Had   Some  Popping  r  Ear   9. PREGNANCY: "Is there any chance you are pregnant?" "When was your last menstrual period?"     no  Protocols used: SINUS PAIN OR CONGESTION-A-AH

## 2017-01-21 NOTE — Telephone Encounter (Signed)
Noted  

## 2017-01-21 NOTE — Progress Notes (Signed)
Pre visit review using our clinic review tool, if applicable. No additional management support is needed unless otherwise documented below in the visit note. 

## 2017-01-21 NOTE — Progress Notes (Signed)
Patient presents to clinic today c/o 1 week of sinus pressure, sinus pain, tooth pain, thick nasal discharge. Denies fever, chills but notes fatigue. Notes sinus headache. Has history of sinusitis and states this feels similar. Denies recent travel or sick contact. Has not taken anything for symptoms.  Past Medical History:  Diagnosis Date  . Adjustment reaction with physical symptoms   . Cancer, skin, squamous cell    R shoulder, removed per derm 2012  . Coronary artery disease    a. 05/2015 c/p post-op surgery; b. 07/2015 MV EF 53%, basal inferoseptal, mid inferoseptal, and apical inferior mild ischemia;  c. 08/2015 Cath/PCI: LM nl, LAD 33m (3.5x20 Promus DES), 47m, LCX nl, RCA nl, EF 60%.  . Elevated PSA    h/o with normal bx per URO  . GERD (gastroesophageal reflux disease)   . History of BPH   . Hyperlipidemia   . Hypertension   . Nephrolithiasis   . OA (osteoarthritis)    with r hip replacement  . Osteoarthrosis involving, or with mention of more than one site, but not specified as generalized, site unspecified(715.80)   . Syncope    negative cardiac w/u per Dr Irish Lack  . Tick bite 2011   likely tick associated illness, treated with doxy with resolution,  . Viral exanthem, unspecified     Current Outpatient Medications on File Prior to Visit  Medication Sig Dispense Refill  . Alcaftadine 0.25 % SOLN Place 1 drop into both eyes daily as needed (pain).    Marland Kitchen amLODipine (NORVASC) 10 MG tablet Take 1 tablet (10 mg total) by mouth daily. 90 tablet 3  . clopidogrel (PLAVIX) 75 MG tablet TAKE 1 TABLET BY MOUTH  DAILY WITH BREAKFAST 90 tablet 1  . diazepam (VALIUM) 5 MG tablet Take 0.5-1 tablets (2.5-5 mg total) by mouth every 12 (twelve) hours as needed for anxiety (and for insomnia).    Marland Kitchen escitalopram (LEXAPRO) 5 MG tablet Take 0.5 tablets (2.5 mg total) by mouth 2 (two) times daily.    . hydrocortisone cream 1 % Apply 1 application topically daily as needed for itching.    .  nitroGLYCERIN (NITROSTAT) 0.4 MG SL tablet Place 1 tablet (0.4 mg total) under the tongue every 5 (five) minutes as needed for chest pain. 25 tablet 3  . pantoprazole (PROTONIX) 40 MG tablet TAKE 1 TABLET BY MOUTH  DAILY 90 tablet 1  . rOPINIRole (REQUIP) 0.5 MG tablet TAKE 1 TABLET BY MOUTH AT  BEDTIME 90 tablet 1  . SIMPLY SALINE NA Place 1 spray into both nostrils daily.    . traMADol (ULTRAM) 50 MG tablet Take 1-2 tablets (50-100 mg total) by mouth every 8 (eight) hours as needed for moderate pain or severe pain. 540 tablet 1   No current facility-administered medications on file prior to visit.     Allergies  Allergen Reactions  . Xarelto [Rivaroxaban] Rash    Rash and itching with blisters.  . Alprazolam Other (See Comments)    abnormal dreams  . Doxycycline Other (See Comments)    GI upset  . Haldol [Haloperidol] Other (See Comments)    Worsens restless leg syndrome  . Hydrocodone Other (See Comments)    Makes head buzz - tolerates oxycodone  . Ibuprofen Other (See Comments)    Cannot take with celebrex  . Lipitor [Atorvastatin] Other (See Comments)    Joints ached  . Morphine And Related Other (See Comments)    intolerant  . Tamsulosin Other (See Comments)  Malaise/ lethargy  . Zolpidem Tartrate Other (See Comments)    Terrible dreams   . Crestor [Rosuvastatin Calcium] Other (See Comments)    Paranoid   . Zetia [Ezetimibe] Diarrhea    Family History  Problem Relation Age of Onset  . Alzheimer's disease Father   . Arrhythmia Mother   . Prostate cancer Maternal Uncle   . Colon cancer Neg Hx   . Heart attack Neg Hx   . Hypertension Neg Hx     Social History   Socioeconomic History  . Marital status: Married    Spouse name: None  . Number of children: None  . Years of education: None  . Highest education level: None  Social Needs  . Financial resource strain: None  . Food insecurity - worry: None  . Food insecurity - inability: None  . Transportation  needs - medical: None  . Transportation needs - non-medical: None  Occupational History  . Occupation: Lobbyist: [employer]  Tobacco Use  . Smoking status: Former Smoker    Packs/day: 1.00    Types: Cigarettes    Last attempt to quit: 04/12/1984    Years since quitting: 32.8  . Smokeless tobacco: Never Used  Substance and Sexual Activity  . Alcohol use: No    Alcohol/week: 0.0 oz  . Drug use: No  . Sexual activity: Yes  Other Topics Concern  . 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     Retired.     His youngest daughter is dead   Review of Systems - See HPI.  All other ROS are negative.  BP 112/80   Pulse 78   Temp 98.3 F (36.8 C) (Oral)   Resp 14   Ht 5\' 8"  (1.727 m)   Wt 152 lb (68.9 kg)   SpO2 98%   BMI 23.11 kg/m   Physical Exam  Constitutional: He is oriented to person, place, and time and well-developed, well-nourished, and in no distress.  HENT:  Head: Normocephalic and atraumatic.  Right Ear: Tympanic membrane normal.  Left Ear: Tympanic membrane normal.  Nose: Mucosal edema and rhinorrhea present. Right sinus exhibits frontal sinus tenderness. Left sinus exhibits frontal sinus tenderness.  Mouth/Throat: Uvula is midline, oropharynx is clear and moist and mucous membranes are normal.  Eyes: Conjunctivae are normal.  Neck: Neck supple.  Cardiovascular: Normal rate, regular rhythm, normal heart sounds and intact distal pulses.  Pulmonary/Chest: Effort normal and breath sounds normal. No respiratory distress. He has no wheezes. He has no rales. He exhibits no tenderness.  Neurological: He is alert and oriented to person, place, and time.  Skin: Skin is warm and dry. No rash noted.    Psychiatric: Affect normal.  Vitals reviewed.   Recent Results (from the past May 31, 2158 hour(s))  Comprehensive metabolic panel     Status: None   Collection Time: 12/10/16  1:41 PM  Result Value Ref Range   Sodium 138 135 - 145 mEq/L   Potassium 4.3 3.5 - 5.1 mEq/L   Chloride 100 96 - 112 mEq/L   CO2 31 19 - 32 mEq/L   Glucose, Bld 89 70 - 99 mg/dL   BUN 7 6 - 23  mg/dL   Creatinine, Ser 0.88 0.40 - 1.50 mg/dL   Total Bilirubin 0.6 0.2 - 1.2 mg/dL   Alkaline Phosphatase 67 39 - 117 U/L   AST 17 0 - 37 U/L   ALT 10 0 - 53 U/L   Total Protein 6.8 6.0 - 8.3 g/dL   Albumin 4.3 3.5 - 5.2 g/dL   Calcium 9.3 8.4 - 10.5 mg/dL   GFR 90.61 >60.00 mL/min  CBC with Differential/Platelet     Status: Abnormal   Collection Time: 12/10/16  1:41 PM  Result Value Ref Range   WBC 5.8 4.0 - 10.5 K/uL   RBC 4.56 4.22 - 5.81 Mil/uL   Hemoglobin 12.5 (L) 13.0 - 17.0 g/dL   HCT 39.2 39.0 - 52.0 %   MCV 85.8 78.0 - 100.0 fl   MCHC 31.9 30.0 - 36.0 g/dL   RDW 15.1 11.5 - 15.5 %   Platelets 329.0 150.0 - 400.0 K/uL   Neutrophils Relative % 55.2 43.0 - 77.0 %   Lymphocytes Relative 33.5 12.0 - 46.0 %   Monocytes Relative 7.7 3.0 - 12.0 %   Eosinophils Relative 2.4 0.0 - 5.0 %   Basophils Relative 1.2 0.0 - 3.0 %   Neutro Abs 3.2 1.4 - 7.7 K/uL   Lymphs Abs 2.0 0.7 - 4.0 K/uL   Monocytes Absolute 0.4 0.1 - 1.0 K/uL   Eosinophils Absolute 0.1 0.0 - 0.7 K/uL   Basophils Absolute 0.1 0.0 - 0.1 K/uL  Lipid panel     Status: Abnormal   Collection Time: 12/10/16  1:41 PM  Result Value Ref Range   Cholesterol 171 0 - 200 mg/dL    Comment: ATP III Classification       Desirable:  < 200 mg/dL               Borderline High:  200 - 239 mg/dL          High:  > = 240 mg/dL   Triglycerides 99.0 0.0 - 149.0 mg/dL    Comment: Normal:  <150 mg/dLBorderline High:  150 - 199 mg/dL   HDL 44.00 >39.00 mg/dL   VLDL 19.8 0.0 - 40.0 mg/dL   LDL Cholesterol 108 (H) 0 - 99 mg/dL   Total CHOL/HDL Ratio 4     Comment:                 Men          Women1/2 Average Risk     3.4          3.3Average Risk          5.0          4.42X Average Risk          9.6          7.13X Average Risk          15.0          11.0                       NonHDL 127.45     Comment: NOTE:  Non-HDL goal should be 30 mg/dL higher than patient's LDL goal (i.e. LDL goal of < 70 mg/dL, would have non-HDL goal of < 100 mg/dL)  PSA, Medicare     Status: Abnormal   Collection Time: 12/10/16  1:41 PM  Result Value Ref Range   PSA 5.97 (H) 0.10 - 4.00 ng/ml    Comment: Test performed using Access  Hybritech PSA Assay, a parmagnetic partical, chemiluminecent immunoassay.   Assessment/Plan: 1. Acute bacterial sinusitis Rx Augmentin.  Increase fluids.  Rest.  Saline nasal spray.  Probiotic.  Mucinex as directed.  Humidifier in bedroom. Supportive measures discussed.  Call or return to clinic if symptoms are not improving.  - amoxicillin-clavulanate (AUGMENTIN) 875-125 MG tablet; Take 1 tablet by mouth 2 (two) times daily.  Dispense: 14 tablet; Refill: 0   Leeanne Rio, PA-C

## 2017-01-21 NOTE — Patient Instructions (Signed)
Please take antibiotic as directed.  Increase fluid intake.  Use Saline nasal spray.  Take a daily multivitamin.  Place a humidifier in the bedroom.  Please call or return clinic if symptoms are not improving.  Sinusitis Sinusitis is redness, soreness, and swelling (inflammation) of the paranasal sinuses. Paranasal sinuses are air pockets within the bones of your face (beneath the eyes, the middle of the forehead, or above the eyes). In healthy paranasal sinuses, mucus is able to drain out, and air is able to circulate through them by way of your nose. However, when your paranasal sinuses are inflamed, mucus and air can become trapped. This can allow bacteria and other germs to grow and cause infection. Sinusitis can develop quickly and last only a short time (acute) or continue over a long period (chronic). Sinusitis that lasts for more than 12 weeks is considered chronic.  CAUSES  Causes of sinusitis include:  Allergies.  Structural abnormalities, such as displacement of the cartilage that separates your nostrils (deviated septum), which can decrease the air flow through your nose and sinuses and affect sinus drainage.  Functional abnormalities, such as when the small hairs (cilia) that line your sinuses and help remove mucus do not work properly or are not present. SYMPTOMS  Symptoms of acute and chronic sinusitis are the same. The primary symptoms are pain and pressure around the affected sinuses. Other symptoms include:  Upper toothache.  Earache.  Headache.  Bad breath.  Decreased sense of smell and taste.  A cough, which worsens when you are lying flat.  Fatigue.  Fever.  Thick drainage from your nose, which often is green and may contain pus (purulent).  Swelling and warmth over the affected sinuses. DIAGNOSIS  Your caregiver will perform a physical exam. During the exam, your caregiver may:  Look in your nose for signs of abnormal growths in your nostrils (nasal  polyps).  Tap over the affected sinus to check for signs of infection.  View the inside of your sinuses (endoscopy) with a special imaging device with a light attached (endoscope), which is inserted into your sinuses. If your caregiver suspects that you have chronic sinusitis, one or more of the following tests may be recommended:  Allergy tests.  Nasal culture A sample of mucus is taken from your nose and sent to a lab and screened for bacteria.  Nasal cytology A sample of mucus is taken from your nose and examined by your caregiver to determine if your sinusitis is related to an allergy. TREATMENT  Most cases of acute sinusitis are related to a viral infection and will resolve on their own within 10 days. Sometimes medicines are prescribed to help relieve symptoms (pain medicine, decongestants, nasal steroid sprays, or saline sprays).  However, for sinusitis related to a bacterial infection, your caregiver will prescribe antibiotic medicines. These are medicines that will help kill the bacteria causing the infection.  Rarely, sinusitis is caused by a fungal infection. In theses cases, your caregiver will prescribe antifungal medicine. For some cases of chronic sinusitis, surgery is needed. Generally, these are cases in which sinusitis recurs more than 3 times per year, despite other treatments. HOME CARE INSTRUCTIONS   Drink plenty of water. Water helps thin the mucus so your sinuses can drain more easily.  Use a humidifier.  Inhale steam 3 to 4 times a day (for example, sit in the bathroom with the shower running).  Apply a warm, moist washcloth to your face 3 to 4 times a day,  or as directed by your caregiver.  Use saline nasal sprays to help moisten and clean your sinuses.  Take over-the-counter or prescription medicines for pain, discomfort, or fever only as directed by your caregiver. SEEK IMMEDIATE MEDICAL CARE IF:  You have increasing pain or severe headaches.  You have  nausea, vomiting, or drowsiness.  You have swelling around your face.  You have vision problems.  You have a stiff neck.  You have difficulty breathing. MAKE SURE YOU:   Understand these instructions.  Will watch your condition.  Will get help right away if you are not doing well or get worse. Document Released: 02/17/2005 Document Revised: 05/12/2011 Document Reviewed: 03/04/2011 [facility] Patient Information 2014 [place], [place].

## 2017-02-12 ENCOUNTER — Encounter: Payer: Self-pay | Admitting: Family Medicine

## 2017-03-04 ENCOUNTER — Encounter: Payer: Self-pay | Admitting: Family Medicine

## 2017-03-11 ENCOUNTER — Other Ambulatory Visit: Payer: Self-pay | Admitting: Family Medicine

## 2017-03-11 MED ORDER — CIPROFLOXACIN HCL 500 MG PO TABS: 500.0000 mg | ORAL_TABLET | Freq: Two times a day (BID) | ORAL | 0 refills | Status: DC

## 2017-03-20 DIAGNOSIS — Z96649 Presence of unspecified artificial hip joint: Secondary | ICD-10-CM | POA: Insufficient documentation

## 2017-03-24 ENCOUNTER — Other Ambulatory Visit: Payer: Self-pay | Admitting: Family Medicine

## 2017-03-24 NOTE — Telephone Encounter (Signed)
Last office visit 12/18/16 Last refill 11/18/16 #90/1

## 2017-03-25 NOTE — Telephone Encounter (Signed)
Verify current usage amount with patient, verify effect, make sure he is still on lexapro and let me know.  Thanks.

## 2017-03-25 NOTE — Telephone Encounter (Signed)
Left message on voicemail for patient to call back. 

## 2017-03-26 NOTE — Telephone Encounter (Signed)
Patient is calling back- he states he is fine with the Diazepam he only uses it as needed to sleep and he found a bottle- disregard his refill request about that.  Patient is still using the Lexapro 5 mg-  1/2 tablet am/pm doing well with that.

## 2017-03-27 ENCOUNTER — Encounter: Payer: Self-pay | Admitting: Family Medicine

## 2017-03-27 NOTE — Telephone Encounter (Signed)
Noted.  Thanks.  I cancelled the rx.

## 2017-04-13 ENCOUNTER — Other Ambulatory Visit: Payer: Self-pay | Admitting: Family Medicine

## 2017-04-18 ENCOUNTER — Other Ambulatory Visit: Payer: Self-pay | Admitting: Family Medicine

## 2017-04-20 NOTE — Telephone Encounter (Signed)
Electronic refill Last refill 91/18 #90/1 Last office visit 12/18/16

## 2017-04-21 NOTE — Telephone Encounter (Signed)
Sent. Thanks.   

## 2017-05-02 ENCOUNTER — Encounter: Payer: Self-pay | Admitting: Family Medicine

## 2017-05-03 ENCOUNTER — Encounter: Payer: Self-pay | Admitting: Family Medicine

## 2017-05-04 ENCOUNTER — Encounter: Payer: Self-pay | Admitting: Family Medicine

## 2017-05-04 ENCOUNTER — Other Ambulatory Visit: Payer: Self-pay | Admitting: Family Medicine

## 2017-05-04 MED ORDER — ESCITALOPRAM OXALATE 5 MG PO TABS: 2.5000 mg | ORAL_TABLET | Freq: Two times a day (BID) | ORAL | Status: DC

## 2017-05-22 ENCOUNTER — Other Ambulatory Visit: Payer: Self-pay | Admitting: Family Medicine

## 2017-05-22 NOTE — Telephone Encounter (Signed)
Electronic refill request. Tramadol Last office visit:   12/18/16 Last Filled:    540 tablet 1 11/18/2016  Please advise.

## 2017-05-24 NOTE — Telephone Encounter (Signed)
Sent. Thanks.   

## 2017-06-10 ENCOUNTER — Encounter: Payer: Self-pay | Admitting: Family Medicine

## 2017-06-10 ENCOUNTER — Other Ambulatory Visit (INDEPENDENT_AMBULATORY_CARE_PROVIDER_SITE_OTHER): Payer: Medicare Other

## 2017-06-10 DIAGNOSIS — Z125 Encounter for screening for malignant neoplasm of prostate: Secondary | ICD-10-CM

## 2017-06-10 LAB — PSA, MEDICARE: PSA: 5.19 ng/ml — ABNORMAL HIGH (ref 0.10–4.00)

## 2017-06-17 ENCOUNTER — Ambulatory Visit: Payer: Self-pay | Admitting: Family Medicine

## 2017-06-18 ENCOUNTER — Encounter: Payer: Self-pay | Admitting: Family Medicine

## 2017-06-18 ENCOUNTER — Ambulatory Visit (INDEPENDENT_AMBULATORY_CARE_PROVIDER_SITE_OTHER): Payer: Medicare Other | Admitting: Family Medicine

## 2017-06-18 VITALS — BP 112/72 | HR 65 | Temp 97.3°F | Wt 162.0 lb

## 2017-06-18 DIAGNOSIS — G4734 Idiopathic sleep related nonobstructive alveolar hypoventilation: Secondary | ICD-10-CM

## 2017-06-18 DIAGNOSIS — N400 Enlarged prostate without lower urinary tract symptoms: Secondary | ICD-10-CM | POA: Diagnosis not present

## 2017-06-18 DIAGNOSIS — F419 Anxiety disorder, unspecified: Secondary | ICD-10-CM

## 2017-06-18 DIAGNOSIS — M158 Other polyosteoarthritis: Secondary | ICD-10-CM

## 2017-06-18 MED ORDER — NITROGLYCERIN 0.4 MG SL SUBL: 0.4000 mg | SUBLINGUAL_TABLET | SUBLINGUAL | 3 refills | Status: DC | PRN

## 2017-06-18 MED ORDER — TRAMADOL HCL 50 MG PO TABS: ORAL_TABLET | ORAL | 1 refills | Status: DC

## 2017-06-18 NOTE — Patient Instructions (Addendum)
Ask your wife if she hears you stopping breathing from snoring or if she hears you gasping for air.  If so, then let me know.  I'm glad the lexapro helped.   Take care.  Glad to see you.  Recheck in about 6 months at a yearly physical.

## 2017-06-18 NOTE — Progress Notes (Signed)
Anxiety d/w pt.  He is getting to see his grandkids more.  He was able to go to his grandson's baseball game on his birthday and that was fantastic for patient.  He and his wife are doing well.  His mood is clearly better on lexapro, 5mg  BID.  No ADE on med.  He wants to continue with med.   He used cipro for traveller's diarrhea in [place] with relief.  He lost his NTG rx while out of the country.  No use, but needed refill to have on hand.    Recheck PSA recently done. PSA is lower than the October value. Given that he had a previous negative biopsy, I would consider this stable and unremarkable for now. D/w pt.  He agrees.  We can recheck periodically.    Sleep testing d/w pt. he had testing ordered by his dentist.  He had 4 nights of testing with pulse oximetry done.  On 3 of the nights he had 0% of the time less than 88%.  On 1 of the nights he had 2% of the time below 88% but still in the 80s.  He does not have known sleep apnea.  We talked about his testing in general.  I will defer to the ordering clinician.  I did ask him to check with his wife to make sure she does not hear him stop breathing or have any other signs of sleep apnea that she has noted.  He is not fatigued during the day.  See after visit summary.  If he does have symptoms suggestive of sleep apnea then we can send him to pulmonary for testing. He agrees.   He wanted to get his tramadol filled locally.  Printed for patient.  R shoulder ROM is good but still with L shoulder pain esp with ROM.  He is going to go see Dr. Onnie Aragon Scarantino in the near future.  Still wearing his knee braces and that helps with knee pain.    H/o eczema on R lower leg, better now.    ROS: Per HPI unless specifically indicated in ROS section   Meds, vitals, and allergies reviewed.   GEN: nad, alert and oriented HEENT: mucous membranes moist NECK: supple w/o LA CV: rrr.  no murmur PULM: ctab, no inc wob ABD: soft, +bs EXT: no edema SKIN: no acute rash  but minimal resolving eczematous changes on the right lower shin

## 2017-06-21 DIAGNOSIS — G4734 Idiopathic sleep related nonobstructive alveolar hypoventilation: Secondary | ICD-10-CM | POA: Insufficient documentation

## 2017-06-21 NOTE — Assessment & Plan Note (Signed)
Reasonable to continue with tramadol as needed for joint pain.  He is going to follow-up with orthopedic clinic.  No adverse effect on medication.  Continue as is.  He agrees.

## 2017-06-21 NOTE — Assessment & Plan Note (Signed)
Technically he did have a brief oxygen desaturation but it only represents 2% of testing on one night.  The other nights had 0 desaturations below 88%.  I want him to check with his wife to see if she has noted any symptoms or signs of sleep apnea at night.  He is not fatigue and I do not think he would need treatment otherwise right now.  I will defer to the ordering clinician.  Pt can update me as needed o/w.  Pt agrees.

## 2017-06-21 NOTE — Assessment & Plan Note (Signed)
PSA is lower than the October value. Given that he had a previous negative biopsy, I would consider this stable and unremarkable for now. D/w pt.  He agrees.  We can recheck periodically.

## 2017-06-21 NOTE — Assessment & Plan Note (Signed)
Improved.  Continue as is.  He thinks the Lexapro made a significant difference.  Okay for outpatient follow-up. >25 minutes spent in face to face time with patient, >50% spent in counselling or coordination of care discussing anxiety, lab results, recent sleep testing, tramadol use for joint pain.

## 2017-06-22 ENCOUNTER — Other Ambulatory Visit: Payer: Self-pay | Admitting: Family Medicine

## 2017-06-23 NOTE — Telephone Encounter (Signed)
Electronic refill request Last refill 11/18/16 #90/1 Last office visit 06/18/17 See allergy/contraindication

## 2017-06-24 NOTE — Telephone Encounter (Signed)
Sent. Thanks.   

## 2017-06-29 ENCOUNTER — Encounter: Payer: Self-pay | Admitting: Family Medicine

## 2017-07-10 ENCOUNTER — Other Ambulatory Visit: Payer: Self-pay | Admitting: Family Medicine

## 2017-07-10 NOTE — Telephone Encounter (Signed)
Electronic refill request Last office visit 06/18/17 Refill request does not match medication list Please confirm directions

## 2017-07-12 NOTE — Telephone Encounter (Signed)
Updated sig, sent.  Thanks.  

## 2017-07-18 ENCOUNTER — Encounter: Payer: Self-pay | Admitting: Family Medicine

## 2017-07-18 ENCOUNTER — Encounter: Payer: Self-pay | Admitting: Cardiology

## 2017-07-20 ENCOUNTER — Other Ambulatory Visit: Payer: Self-pay | Admitting: *Deleted

## 2017-07-20 MED ORDER — ESCITALOPRAM OXALATE 5 MG PO TABS: 2.5000 mg | ORAL_TABLET | Freq: Two times a day (BID) | ORAL | 3 refills | Status: DC

## 2017-07-28 ENCOUNTER — Encounter: Payer: Self-pay | Admitting: Cardiology

## 2017-07-28 ENCOUNTER — Encounter: Payer: Self-pay | Admitting: Family Medicine

## 2017-08-09 ENCOUNTER — Encounter: Payer: Self-pay | Admitting: Family Medicine

## 2017-08-13 ENCOUNTER — Other Ambulatory Visit: Payer: Self-pay | Admitting: Family Medicine

## 2017-08-13 MED ORDER — TRAMADOL HCL 50 MG PO TABS: ORAL_TABLET | ORAL | 0 refills | Status: DC

## 2017-08-14 ENCOUNTER — Encounter: Payer: Self-pay | Admitting: Family Medicine

## 2017-08-30 ENCOUNTER — Encounter: Payer: Self-pay | Admitting: Family Medicine

## 2017-09-01 ENCOUNTER — Other Ambulatory Visit: Payer: Self-pay | Admitting: Family Medicine

## 2017-09-01 MED ORDER — TRAMADOL HCL 50 MG PO TABS: ORAL_TABLET | ORAL | 1 refills | Status: DC

## 2017-09-17 ENCOUNTER — Other Ambulatory Visit: Payer: Self-pay | Admitting: Family Medicine

## 2017-09-17 ENCOUNTER — Encounter: Payer: Self-pay | Admitting: Cardiology

## 2017-09-17 ENCOUNTER — Ambulatory Visit (INDEPENDENT_AMBULATORY_CARE_PROVIDER_SITE_OTHER): Payer: Medicare Other | Admitting: Cardiology

## 2017-09-17 VITALS — BP 106/64 | HR 68 | Ht 67.0 in | Wt 163.1 lb

## 2017-09-17 DIAGNOSIS — I2510 Atherosclerotic heart disease of native coronary artery without angina pectoris: Secondary | ICD-10-CM | POA: Diagnosis not present

## 2017-09-17 DIAGNOSIS — E785 Hyperlipidemia, unspecified: Secondary | ICD-10-CM

## 2017-09-17 DIAGNOSIS — E7800 Pure hypercholesterolemia, unspecified: Secondary | ICD-10-CM

## 2017-09-17 DIAGNOSIS — Z789 Other specified health status: Secondary | ICD-10-CM | POA: Diagnosis not present

## 2017-09-17 NOTE — Patient Instructions (Signed)
Medication Instructions:  The current medical regimen is effective;  continue present plan and medications.  You are being referred to our [facility] to discuss other treatment options for the management of your cholesterol.  Follow-Up: Follow up in 1 year with Dr. Marlou Porch.  You will receive a letter in the mail 2 months before you are due.  Please call us when you receive this letter to schedule your follow up appointment.  If you need a refill on your cardiac medications before your next appointment, please call your pharmacy.  Thank you for choosing [facility]!!

## 2017-09-17 NOTE — Progress Notes (Signed)
Cardiology Office Note    Date:  09/17/2017   ID:  [name], [name] 1945-07-21, MRN [id number]  PCP:  Tonia Ghent, MD  Cardiologist:   Candee Furbish, MD     History of Present Illness:  [name] is a 72 y.o. male with coronary artery disease status post LAD drug-eluting stent placement on 08/08/15 after abnormal stress test here for follow-up. Also has essential hypertension, hyperlipidemia, GERD.  Interestingly, in review of phone note from 09/11/15, about 5 weeks after cardiac catheterization he was feeling paranoid and suspicious. He was worried that he may be having a reaction to Crestor. He stopped taking Crestor, Plavix, Toprol resolved and when he does not take the medications he feels better. He was instructed to go back on the Plavix by Dr. [name].  On 11/20/15 he emailed Dr. [name] stating that emotionally he was not doing very well.  Since stopping the Crestor, he is feeling much better. Unfortunately his wife broke her wrist. Dr. Apolonio Schneiders.  Overall he is doing quite well, no chest pain, no shortness of breath, no fevers, no chills, no syncope.  Took the Zetia, LDL was down to 101. Makes him sick, diarrhea. Stop. Fruit. Vegs. He is very Patent attorney. He sometimes wears ankle brace and knee braces. He previously asked about Celebrex, should not take. No chest pain, no shortness of breath him a no syncope, no bleeding.  09/17/2017- overall been doing quite well, no syncope bleeding orthopnea PND chest pain. Dr. Onnie Graham. Shoulder likely needed replacement. Hips done.     Past Medical History:  Diagnosis Date  . Adjustment reaction with physical symptoms   . Cancer, skin, squamous cell    R shoulder, removed per derm 2012  . Coronary artery disease    a. 05/2015 c/p post-op surgery; b. 07/2015 MV EF 53%, basal inferoseptal, mid inferoseptal, and apical inferior mild ischemia;  c. 08/2015 Cath/PCI: LM nl, LAD 40m (3.5x20 Promus DES), 65m, LCX nl, RCA nl, EF 60%.  . Elevated  PSA    h/o with normal bx per URO  . GERD (gastroesophageal reflux disease)   . History of BPH   . Hyperlipidemia   . Hypertension   . Nephrolithiasis   . OA (osteoarthritis)    with r hip replacement  . Osteoarthrosis involving, or with mention of more than one site, but not specified as generalized, site unspecified(715.80)   . Syncope    negative cardiac w/u per Dr Irish Lack  . Tick bite 2011   likely tick associated illness, treated with doxy with resolution,  . Viral exanthem, unspecified     Past Surgical History:  Procedure Laterality Date  . ACHILLES TENDON REPAIR  1984  . CARDIAC CATHETERIZATION N/A 08/08/2015   Procedure: Left Heart Cath and Coronary Angiography;  Surgeon: Jerline Pain, MD;  Location: [facility];  Service: Cardiovascular;  Laterality: N/A;  . CARDIAC CATHETERIZATION N/A 08/08/2015   Procedure: Coronary Stent Intervention;  Surgeon: Jettie Booze, MD;  Location: [facility];  Service: Cardiovascular;  Laterality: N/A;  . CORONARY STENT PLACEMENT  08/08/2015   MID LAD WITH DES  . JOINT REPLACEMENT    . SHOULDER ARTHROSCOPY  2012   R rotator cuff (biceps tear not repaired)  . SKIN CANCER EXCISION     right shoulder  . TOTAL HIP ARTHROPLASTY     right  . TOTAL HIP ARTHROPLASTY Left 05/02/2015   Procedure: TOTAL LEFT HIP ARTHROPLASTY ANTERIOR APPROACH;  Surgeon:  Gaynelle Arabian, MD;  Location: [facility];  Service: Orthopedics;  Laterality: Left;    Current Medications: Outpatient Medications Prior to Visit  Medication Sig Dispense Refill  . Alcaftadine 0.25 % SOLN Place 1 drop into both eyes daily as needed (pain).    Marland Kitchen amLODipine (NORVASC) 10 MG tablet Take 1 tablet (10 mg total) by mouth daily. 90 tablet 3  . clopidogrel (PLAVIX) 75 MG tablet TAKE 1 TABLET BY MOUTH  DAILY WITH BREAKFAST 90 tablet 1  . diazepam (VALIUM) 5 MG tablet TAKE 1 TO 2 TABLETS BY  MOUTH EVERY 12 HOURS AS  NEEDED FOR ANXIETY AND FOR  INSOMNIA. 90 tablet 1  .  escitalopram (LEXAPRO) 5 MG tablet Take 0.5-1 tablets (2.5-5 mg total) by mouth 2 (two) times daily. 180 tablet 3  . hydrocortisone cream 1 % Apply 1 application topically daily as needed for itching.    . nitroGLYCERIN (NITROSTAT) 0.4 MG SL tablet Place 1 tablet (0.4 mg total) under the tongue every 5 (five) minutes as needed for chest pain. 25 tablet 3  . pantoprazole (PROTONIX) 40 MG tablet TAKE 1 TABLET BY MOUTH  DAILY 90 tablet 1  . rOPINIRole (REQUIP) 0.5 MG tablet TAKE 1 TABLET BY MOUTH AT  BEDTIME 90 tablet 1  . SIMPLY SALINE NA Place 1 spray into both nostrils daily.    . traMADol (ULTRAM) 50 MG tablet TAKE 1 TO 2 TABLETS BY  MOUTH EVERY 8 HOURS AS  NEEDED FOR MODERATE PAIN OR SEVERE PAIN. 180 tablet 1  . escitalopram (LEXAPRO) 5 MG tablet Take 0.5-1 tablets (2.5-5 mg total) by mouth 2 (two) times daily.     No facility-administered medications prior to visit.      Allergies:   Xarelto [rivaroxaban]; Alprazolam; Doxycycline; Haldol [haloperidol]; Ibuprofen; Lipitor [atorvastatin]; Morphine and related; Tamsulosin; Zolpidem tartrate; Crestor [rosuvastatin calcium]; and Zetia [ezetimibe]   Social History   Socioeconomic History  . Marital status: Married    Spouse name: Not on file  . Number of children: Not on file  . Years of education: Not on file  . Highest education level: Not on file  Occupational History  . Occupation: Lobbyist: [employer]  Social Needs  . Financial resource strain: Not on file  . Food insecurity:    Worry: Not on file    Inability: Not on file  . Transportation needs:    Medical: Not on file    Non-medical: Not on file  Tobacco Use  . Smoking status: Former Smoker    Packs/day: 1.00    Types: Cigarettes    Last attempt to quit: 04/12/1984    Years since quitting: 33.4  . Smokeless tobacco: Never Used  Substance and Sexual Activity  . Alcohol use: No    Alcohol/week: 0.0 oz  . Drug use: No  . Sexual activity: Yes    Lifestyle  . Physical activity:    Days per week: Not on file    Minutes per session: Not on file  . Stress: Not on file  Relationships  . Social connections:    Talks on phone: Not on file    Gets together: Not on file    Attends religious service: Not on file    Active member of club or organization: Not on file    Attends meetings of clubs or organizations: Not on file    Relationship status: Not on file  Other Topics Concern  . 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     Retired.     His youngest daughter is dead     Family History:  The patient's family history includes Alzheimer's disease in his father; Arrhythmia in his mother; Prostate cancer in his maternal uncle.   ROS:   Please see the history of present illness.    Review of Systems  All other systems reviewed and are negative.    PHYSICAL EXAM:   VS:  BP 106/64 (BP Location: Left Arm, Patient Position: Sitting, Cuff Size: Normal)   Pulse 68   Ht 5\' 7"  (1.702 m)   Wt 163 lb 2 oz (74 kg)   BMI 25.55 kg/m    GEN: Well nourished, well developed, in no acute distress  HEENT: normal  Neck: no JVD, carotid bruits, or masses Cardiac: RRR; no murmurs, rubs, or gallops,no edema  Respiratory:  clear to auscultation bilaterally, normal work of breathing GI: soft, nontender, nondistended, + BS MS: no deformity or atrophy  Skin: warm and dry, no rash Neuro:  Alert and Oriented x 3, Strength and sensation are intact Psych: euthymic mood, full affect   Wt Readings from Last 3 Encounters:  09/17/17 163 lb 2 oz (74 kg)  06/18/17 162 lb (73.5 kg)  01/21/17 152 lb (68.9 kg)      Studies/Labs Reviewed:   EKG: 09/17/2017-sinus rhythm 68 with no other abnormalities.   Personally viewed.  Recent Labs: 12/10/2016: ALT 10; BUN 7; Creatinine, Ser 0.88; Hemoglobin 12.5; Platelets 329.0; Potassium 4.3; Sodium 138   Lipid Panel    Component Value Date/Time   CHOL 171 12/10/2016 1341   TRIG 99.0 12/10/2016 1341   HDL 44.00 12/10/2016 1341   CHOLHDL 4 12/10/2016 1341   VLDL 19.8 12/10/2016 1341   LDLCALC 108 (H) 12/10/2016 1341   LDLDIRECT 135.6 09/10/2011 0835    Additional studies/ records that were reviewed today include:  Hospital notes, cardiac catheterization, EKGs reviewed.    ASSESSMENT:    1. Coronary artery disease involving native coronary artery of native heart without angina pectoris   2. Hyperlipidemia, unspecified hyperlipidemia type   3. Statin intolerance   4. Pure hypercholesterolemia      PLAN:  In order of problems listed above:  Coronary artery disease -LAD DES 08/08/2015.  Doing well, no anginal symptoms.  Very appreciative of Dr. Irish Lack and [facility] in the Cath Lab.  - is taking CBD oil for sleep.  I discussed the potential interactions with Plavix and SSRIs.  Preoperative cardiovascular risk assessment -When he needs his shoulder surgery, he may proceed with moderate risk from a cardiovascular perspective.  No further testing required.  He is doing quite well.  If he would need to hold his Plavix, he may do so for 5 days prior to surgery.  Hypertension - Overall good control, medications reviewed.  Hyperlipidemia - Statin intolerance to Crestor, Lipitor, pravastatin, Zetia as well-diarrhea.  Continue with diet and exercise.  PCSK9 inhibitors would be a potential option for him given his underlying coronary disease.  I will have him discuss with lipid clinic to see if he can qualify.  GERD -On proton pump inhibitor, Protonix.  Medication Adjustments/Labs and Tests Ordered: Current medicines are reviewed at  length with the patient today.  Concerns regarding medicines are outlined above.  Medication changes, Labs and  Tests ordered today are listed in the Patient Instructions below. Patient Instructions  Medication Instructions:  The current medical regimen is effective;  continue present plan and medications.  You are being referred to our [facility] to discuss other treatment options for the management of your cholesterol.  Follow-Up: Follow up in 1 year with Dr. Marlou Porch.  You will receive a letter in the mail 2 months before you are due.  Please call us when you receive this letter to schedule your follow up appointment.  If you need a refill on your cardiac medications before your next appointment, please call your pharmacy.  Thank you for choosing [facility]!!        Signed, Candee Furbish, MD  09/17/2017 3:44 PM    [address], [place], [place]  [address] Phone: [phone]; Fax: [phone]

## 2017-10-02 ENCOUNTER — Encounter: Payer: Self-pay | Admitting: Family Medicine

## 2017-10-08 ENCOUNTER — Encounter: Payer: Self-pay | Admitting: Family Medicine

## 2017-10-12 ENCOUNTER — Encounter: Payer: Self-pay | Admitting: Family Medicine

## 2017-10-12 ENCOUNTER — Telehealth: Payer: Self-pay | Admitting: Family Medicine

## 2017-10-12 ENCOUNTER — Ambulatory Visit (INDEPENDENT_AMBULATORY_CARE_PROVIDER_SITE_OTHER): Payer: Medicare Other | Admitting: Pharmacist

## 2017-10-12 DIAGNOSIS — E785 Hyperlipidemia, unspecified: Secondary | ICD-10-CM

## 2017-10-12 NOTE — Telephone Encounter (Signed)
See mychart message.  Please check with patient and pharmacy and see what the issue is with his tramadol rx.  Thanks.

## 2017-10-12 NOTE — Telephone Encounter (Signed)
Spoke with patient and advised that refill is remaining at the pharmacy but the medication is not in stock.  The pharmacist said they will order it and it will be ready after 4 pm tomorrow.  Patient advised.

## 2017-10-12 NOTE — Patient Instructions (Addendum)
It was great seeing you today!  After you discuss with Dr. [name], please let us know what your decision is about the [place]. The first dose of Repatha will come with a sharps container and instructions for the injection. When the sharps container becomes full, you can buy a new one, call the manufacturer, or use a laundry detergent or bleach bottle.   Please discard your current nitroglycerin bottle and get a new bottle from the pharmacy ([facility] on [address]).  After you have contacted Dr. [name] and if you have any questions, please call the clinic at [phone].

## 2017-10-12 NOTE — Progress Notes (Signed)
Patient ID: [name]                 DOB: [birth year]                    MRN: [id number]     HPI: [name] is a 72 y.o. male patient of Dr. Marlou Porch that presents today for lipid evaluation.  PMH includes coronary artery disease status post LAD drug-eluting stent placement on 08/08/15, HTN, HLD, GERD. Has most recently tried rosuvastatin in 2017 however stated he felt paranoid and suspicious and felt better once stopping the rosuvastatin. Also tried ezetimibe in 2017 however made him sick with diarrhea. Patient has history of self-discontinuing his medications as he states he feels better when he stops taking them.   Patient presents to clinic for lipid management. States that statins makes him "feel old" wants to "lay around". All statins have the same effect. Same effects with the ezetimibe. Patient agreeable to trying Repatha but he cannot give it to himself. Patient's wife could potentially give him the shot. Patient also noted that he has had his nitroglycerin bottle for years.   Risk Factors: CAD, HTN, HLD LDL Goal: <70 mg/dL  Current Medications: none Intolerances: ezetimibe- diarrhea, atorvastatin- joints ached, rosuvastatin- paranoia, pravastatin  Diet: breakfast usually at home (cheerios, fruit, coffee), lunch (sandwiches & chips with tea/sprite), dinner (from church- lasagna, pork or they go out to eat). Eats country style steak, likes pork tenderloins.   Exercise: rides bike for 30 minutes a few days a week. If grandchildren are over plays football, basketball, baseball.   Family History: mother- arrythmia  Social History: Former smoker- 1 ppd, quit in [place]: 12/10/2016: TC: 171, HDL 44, LDL 108, TG 99, non-HDL 127.45 (no therapy)  Past Medical History:  Diagnosis Date  . Adjustment reaction with physical symptoms   . Cancer, skin, squamous cell    R shoulder, removed per derm 2012  . Coronary artery disease    a. 05/2015 c/p post-op surgery; b. 07/2015 MV EF 53%,  basal inferoseptal, mid inferoseptal, and apical inferior mild ischemia;  c. 08/2015 Cath/PCI: LM nl, LAD 23m (3.5x20 Promus DES), 72m, LCX nl, RCA nl, EF 60%.  . Elevated PSA    h/o with normal bx per URO  . GERD (gastroesophageal reflux disease)   . History of BPH   . Hyperlipidemia   . Hypertension   . Nephrolithiasis   . OA (osteoarthritis)    with r hip replacement  . Osteoarthrosis involving, or with mention of more than one site, but not specified as generalized, site unspecified(715.80)   . Syncope    negative cardiac w/u per Dr Irish Lack  . Tick bite 2011   likely tick associated illness, treated with doxy with resolution,  . Viral exanthem, unspecified     Current Outpatient Medications on File Prior to Visit  Medication Sig Dispense Refill  . Alcaftadine 0.25 % SOLN Place 1 drop into both eyes daily as needed (pain).    Marland Kitchen amLODipine (NORVASC) 10 MG tablet Take 1 tablet (10 mg total) by mouth daily. 90 tablet 3  . clopidogrel (PLAVIX) 75 MG tablet TAKE 1 TABLET BY MOUTH  DAILY WITH BREAKFAST 90 tablet 1  . diazepam (VALIUM) 5 MG tablet TAKE 1 TO 2 TABLETS BY  MOUTH EVERY 12 HOURS AS  NEEDED FOR ANXIETY AND FOR  INSOMNIA. 90 tablet 1  . escitalopram (LEXAPRO) 5 MG tablet Take 0.5-1 tablets (2.5-5 mg total)  by mouth 2 (two) times daily. 180 tablet 3  . hydrocortisone cream 1 % Apply 1 application topically daily as needed for itching.    . nitroGLYCERIN (NITROSTAT) 0.4 MG SL tablet Place 1 tablet (0.4 mg total) under the tongue every 5 (five) minutes as needed for chest pain. 25 tablet 3  . pantoprazole (PROTONIX) 40 MG tablet TAKE 1 TABLET BY MOUTH  DAILY 90 tablet 1  . rOPINIRole (REQUIP) 0.5 MG tablet TAKE 1 TABLET BY MOUTH AT  BEDTIME 90 tablet 1  . SIMPLY SALINE NA Place 1 spray into both nostrils daily.    . traMADol (ULTRAM) 50 MG tablet TAKE 1 TO 2 TABLETS BY  MOUTH EVERY 8 HOURS AS  NEEDED FOR MODERATE PAIN OR SEVERE PAIN. 180 tablet 1   No current facility-administered  medications on file prior to visit.     Allergies  Allergen Reactions  . Xarelto [Rivaroxaban] Rash    Rash and itching with blisters.  . Alprazolam Other (See Comments)    abnormal dreams  . Doxycycline Other (See Comments)    GI upset  . Haldol [Haloperidol] Other (See Comments)    Worsens restless leg syndrome  . Ibuprofen Other (See Comments)    Cannot take with celebrex  . Lipitor [Atorvastatin] Other (See Comments)    Joints ached  . Morphine And Related Other (See Comments)    intolerant  . Tamsulosin Other (See Comments)    Malaise/ lethargy  . Zolpidem Tartrate Other (See Comments)    Terrible dreams   . Crestor [Rosuvastatin Calcium] Other (See Comments)    Paranoid   . Zetia [Ezetimibe] Diarrhea    Assessment/Plan: Hyperlipidemia: LDL currently not at goal (LDL<70). He is agreeable to starting the medication if it can improve his LDL. He wants to discuss Repatha with his PCP before he starts it. Will start prior authorization for Repatha. Provided medication education to patient about Repatha, including injection technique, side effects, and cost obligation. Educated patient on cholesterol goals. Encouraged patient to work on improving diet. Informed patient that he should refill his nitroglycerin.    Thank you,  Lelan Pons. Patterson Hammersmith, PharmD  [facility]  10/12/2017 9:33 AM  Patient seen with Danella Penton, PharmD Candidate and Claiborne Billings PharmD, PGY2 Cardiology Resident

## 2017-10-14 ENCOUNTER — Telehealth: Payer: Self-pay | Admitting: Pharmacist

## 2017-10-14 ENCOUNTER — Encounter: Payer: Self-pay | Admitting: Family Medicine

## 2017-10-14 MED ORDER — EVOLOCUMAB 140 MG/ML ~~LOC~~ SOAJ: 1.0000 "pen " | SUBCUTANEOUS | 11 refills | Status: DC

## 2017-10-14 NOTE — Telephone Encounter (Signed)
PA approved for Repatha through 04/16/18. Rx sent to specialty pharmacy to assess copay.

## 2017-10-15 NOTE — Telephone Encounter (Signed)
Copay affordable at $65 per month. Shipment being sent the first week in Sept per pt request.

## 2017-11-04 ENCOUNTER — Other Ambulatory Visit: Payer: Self-pay | Admitting: Family Medicine

## 2017-11-04 NOTE — Telephone Encounter (Signed)
Electronic refill request Last office visit 10/02/17 Last refill 04/13/17 #90/1

## 2017-11-04 NOTE — Telephone Encounter (Signed)
Sent. Thanks.   

## 2017-11-07 ENCOUNTER — Encounter: Payer: Self-pay | Admitting: Family Medicine

## 2017-11-09 ENCOUNTER — Encounter: Payer: Self-pay | Admitting: Family Medicine

## 2017-11-09 ENCOUNTER — Telehealth: Payer: Self-pay | Admitting: Pharmacist

## 2017-11-09 NOTE — Telephone Encounter (Signed)
Pt presents today for Repatha injection training. Pt successfully self administered Repatha into the abdomen. Pt sees PCP in mid October and will have lipids and LFTs checked at that time.

## 2017-11-11 ENCOUNTER — Encounter: Payer: Self-pay | Admitting: Family Medicine

## 2017-11-11 ENCOUNTER — Other Ambulatory Visit: Payer: Self-pay | Admitting: Family Medicine

## 2017-11-11 MED ORDER — TRAMADOL HCL 50 MG PO TABS: ORAL_TABLET | ORAL | 1 refills | Status: DC

## 2017-11-24 ENCOUNTER — Other Ambulatory Visit: Payer: Self-pay | Admitting: Family Medicine

## 2017-11-25 ENCOUNTER — Encounter: Payer: Self-pay | Admitting: Family Medicine

## 2017-11-25 NOTE — Telephone Encounter (Signed)
Name of Medication: Diazepam Name of Pharmacy: [facility] Last Fill or Written Date and Quantity: 06/24/17 #90/1 Last Office Visit and Type: 06/18/17 Next Office Visit and Type: 12/15/17 Medicare wellnes Last Controlled Substance Agreement Date: 10/22/12 Last UDS: 10/22/12

## 2017-11-26 NOTE — Telephone Encounter (Signed)
Sent. Thanks.   

## 2017-12-04 ENCOUNTER — Encounter: Payer: Self-pay | Admitting: Family Medicine

## 2017-12-11 ENCOUNTER — Other Ambulatory Visit: Payer: Self-pay | Admitting: *Deleted

## 2017-12-11 ENCOUNTER — Encounter: Payer: Self-pay | Admitting: Family Medicine

## 2017-12-11 NOTE — Telephone Encounter (Deleted)
Electronic refill request. Tramadol Last office visit:   06/18/17  Has an appt in 2 weeks Last Filled:    .

## 2017-12-15 ENCOUNTER — Ambulatory Visit: Payer: Medicare Other

## 2017-12-17 ENCOUNTER — Ambulatory Visit: Payer: Medicare Other

## 2017-12-17 ENCOUNTER — Other Ambulatory Visit: Payer: Self-pay | Admitting: Family Medicine

## 2017-12-17 DIAGNOSIS — Z125 Encounter for screening for malignant neoplasm of prostate: Secondary | ICD-10-CM

## 2017-12-17 DIAGNOSIS — I10 Essential (primary) hypertension: Secondary | ICD-10-CM

## 2017-12-22 ENCOUNTER — Ambulatory Visit: Payer: Medicare Other

## 2017-12-22 ENCOUNTER — Other Ambulatory Visit (INDEPENDENT_AMBULATORY_CARE_PROVIDER_SITE_OTHER): Payer: Medicare Other

## 2017-12-22 DIAGNOSIS — I10 Essential (primary) hypertension: Secondary | ICD-10-CM | POA: Diagnosis not present

## 2017-12-22 DIAGNOSIS — Z125 Encounter for screening for malignant neoplasm of prostate: Secondary | ICD-10-CM | POA: Diagnosis not present

## 2017-12-22 LAB — CBC WITH DIFFERENTIAL/PLATELET
BASOS PCT: 1.1 % (ref 0.0–3.0)
Basophils Absolute: 0.1 10*3/uL (ref 0.0–0.1)
EOS PCT: 2.4 % (ref 0.0–5.0)
Eosinophils Absolute: 0.2 10*3/uL (ref 0.0–0.7)
HEMATOCRIT: 39.5 % (ref 39.0–52.0)
HEMOGLOBIN: 13 g/dL (ref 13.0–17.0)
LYMPHS PCT: 30.8 % (ref 12.0–46.0)
Lymphs Abs: 2.2 10*3/uL (ref 0.7–4.0)
MCHC: 33 g/dL (ref 30.0–36.0)
MCV: 86.2 fl (ref 78.0–100.0)
Monocytes Absolute: 0.6 10*3/uL (ref 0.1–1.0)
Monocytes Relative: 8.2 % (ref 3.0–12.0)
NEUTROS ABS: 4.2 10*3/uL (ref 1.4–7.7)
Neutrophils Relative %: 57.5 % (ref 43.0–77.0)
PLATELETS: 322 10*3/uL (ref 150.0–400.0)
RBC: 4.58 Mil/uL (ref 4.22–5.81)
RDW: 14.4 % (ref 11.5–15.5)
WBC: 7.2 10*3/uL (ref 4.0–10.5)

## 2017-12-22 LAB — LIPID PANEL
Cholesterol: 101 mg/dL (ref 0–200)
HDL: 48.3 mg/dL (ref >39.00)
LDL CALC: 25 mg/dL (ref 0–99)
NONHDL: 52.68
Total CHOL/HDL Ratio: 2
Triglycerides: 139 mg/dL (ref 0.0–149.0)
VLDL: 27.8 mg/dL (ref 0.0–40.0)

## 2017-12-22 LAB — COMPREHENSIVE METABOLIC PANEL
ALBUMIN: 4.4 g/dL (ref 3.5–5.2)
ALK PHOS: 62 U/L (ref 39–117)
ALT: 11 U/L (ref 0–53)
AST: 16 U/L (ref 0–37)
BUN: 9 mg/dL (ref 6–23)
CO2: 29 mEq/L (ref 19–32)
CREATININE: 0.9 mg/dL (ref 0.40–1.50)
Calcium: 9.3 mg/dL (ref 8.4–10.5)
Chloride: 101 mEq/L (ref 96–112)
GFR: 88.03 mL/min (ref >60.00)
GLUCOSE: 98 mg/dL (ref 70–99)
Potassium: 4.3 mEq/L (ref 3.5–5.1)
SODIUM: 137 meq/L (ref 135–145)
TOTAL PROTEIN: 6.9 g/dL (ref 6.0–8.3)
Total Bilirubin: 0.5 mg/dL (ref 0.2–1.2)

## 2017-12-22 LAB — PSA, MEDICARE: PSA: 5.06 ng/ml — ABNORMAL HIGH (ref 0.10–4.00)

## 2017-12-24 ENCOUNTER — Ambulatory Visit (INDEPENDENT_AMBULATORY_CARE_PROVIDER_SITE_OTHER): Payer: Medicare Other | Admitting: Family Medicine

## 2017-12-24 ENCOUNTER — Encounter: Payer: Self-pay | Admitting: Family Medicine

## 2017-12-24 VITALS — BP 120/74 | HR 93 | Temp 98.0°F | Ht 67.0 in | Wt 160.5 lb

## 2017-12-24 DIAGNOSIS — G2581 Restless legs syndrome: Secondary | ICD-10-CM | POA: Diagnosis not present

## 2017-12-24 DIAGNOSIS — Z0000 Encounter for general adult medical examination without abnormal findings: Secondary | ICD-10-CM

## 2017-12-24 DIAGNOSIS — M158 Other polyosteoarthritis: Secondary | ICD-10-CM

## 2017-12-24 DIAGNOSIS — I10 Essential (primary) hypertension: Secondary | ICD-10-CM

## 2017-12-24 DIAGNOSIS — F419 Anxiety disorder, unspecified: Secondary | ICD-10-CM

## 2017-12-24 DIAGNOSIS — G4700 Insomnia, unspecified: Secondary | ICD-10-CM

## 2017-12-24 DIAGNOSIS — Z7189 Other specified counseling: Secondary | ICD-10-CM

## 2017-12-24 DIAGNOSIS — I2510 Atherosclerotic heart disease of native coronary artery without angina pectoris: Secondary | ICD-10-CM

## 2017-12-24 DIAGNOSIS — E785 Hyperlipidemia, unspecified: Secondary | ICD-10-CM

## 2017-12-24 NOTE — Patient Instructions (Addendum)
You should get a call about cardiac clearance prior to your surgery.   Don't change your meds for now.  Take care.  Glad to see you.  Update me as needed.

## 2017-12-24 NOTE — Progress Notes (Signed)
I have personally reviewed the Medicare Annual Wellness questionnaire and have noted 1. The patient's medical and social history 2. Their use of alcohol, tobacco or illicit drugs 3. Their current medications and supplements 4. The patient's functional ability including ADL's, fall risks, home safety risks and hearing or visual             impairment. 5. Diet and physical activities 6. Evidence for depression or mood disorders  The patients weight, height, BMI have been recorded in the chart and visual acuity is per eye clinic.  I have made referrals, counseling and provided education to the patient based review of the above and I have provided the pt with a written personalized care plan for preventive services.  Provider list updated- see scanned forms.  Routine anticipatory guidance given to patient.  See health maintenance. The possibility exists that previously documented standard health maintenance information may have been brought forward from a previous encounter into this note.  If needed, that same information has been updated to reflect the current situation based on today's encounter.    Flu to be done out of clinic.  Discussed with patient. Shingles 2009 PNA up-to-date Tetanus 2018 Colonoscopy 2018 Prostate cancer screening- PSA stable, with prev neg bx.   Advance directive-wife designated if patient were incapacitated. Cognitive function addressed- see scanned forms- and if abnormal then additional documentation follows.   Still taking tramadol as needed for joint pain, esp his back pain.  He is going to have L shoulder surgery in 03/2018.  He is using 15 degrees past horizontal on an inversion table with some relief of back pain.  Not lightheaded after use- he is careful about getting up slowly.  Cautions discussed with patient.  He doesn't have contact with his wife's daughter's kids.  He has been supportive but "it's in the hands of the Lord."   His step daughter cut off  contact.  He is trying to adjust to the situation.  He has gone to counseling in the meantime.  He gradually got of lexapro.  No SI/HI.    CAD.  Per cards.  S/p stent.  No CP, SOB, BLE edema.    Hypertension:    Using medication without problems or lightheadedness: yes Chest pain with exertion: no Edema:no Short of breath:no Labs d/w pt.   Taking diazepam at night for sleep w/o ADE.  requip helps with RLS sx. no adverse effect on medication.  Elevated Cholesterol: Using medications without problems: yes- mild fatigue and diarrhea after repatha use.   Muscle aches:  Not from repatha, he does have aches as described above that are likely not related to his current medications. Diet compliance: yes Exercise: as tolerated.    PMH and SH reviewed  Meds, vitals, and allergies reviewed.   ROS: Per HPI.  Unless specifically indicated otherwise in HPI, the patient denies:  General: fever. Eyes: acute vision changes ENT: sore throat Cardiovascular: chest pain Respiratory: SOB GI: vomiting GU: dysuria Musculoskeletal: acute back pain Derm: acute rash Neuro: acute motor dysfunction Psych: worsening mood Endocrine: polydipsia Heme: bleeding Allergy: hayfever  GEN: nad, alert and oriented HEENT: mucous membranes moist NECK: supple w/o LA CV: rrr. PULM: ctab, no inc wob ABD: soft, +bs EXT: no edema SKIN: no acute rash

## 2017-12-27 NOTE — Assessment & Plan Note (Signed)
No chest pain, shortness of breath, extremity edema.  Tolerating current medications.  Labs discussed with patient.  Continue with as is.  Discussed diet and exercise.

## 2017-12-27 NOTE — Assessment & Plan Note (Signed)
Advance directive- wife designated if patient were incapacitated.  

## 2017-12-27 NOTE — Assessment & Plan Note (Signed)
Improved with Requip.  No adverse effect on medication.  Reasonable to continue.  Discussed.

## 2017-12-27 NOTE — Assessment & Plan Note (Signed)
Using diazepam at night as needed.  He will update me as needed.  No adverse effect on medication.

## 2017-12-27 NOTE — Assessment & Plan Note (Signed)
Continue tramadol as needed.  He is trying to use an inversion table to help with some of his joint aches.  Update me as needed.  He will follow-up with orthopedics.

## 2017-12-27 NOTE — Assessment & Plan Note (Signed)
He does use diazepam as needed.  He was able to wean off Lexapro in the meantime.  He has significant social stressors.  Discussed.  He has gone to counseling previously.  He is trying to adjust to his current situation.

## 2017-12-27 NOTE — Assessment & Plan Note (Signed)
Flu to be done out of clinic.  Discussed with patient. Shingles 2009 PNA up-to-date Tetanus 2018 Colonoscopy 2018 Prostate cancer screening- PSA stable, with prev neg bx.   Advance directive-wife designated if patient were incapacitated. Cognitive function addressed- see scanned forms- and if abnormal then additional documentation follows.

## 2017-12-28 ENCOUNTER — Encounter: Payer: Self-pay | Admitting: Family Medicine

## 2018-01-07 ENCOUNTER — Encounter: Payer: Self-pay | Admitting: Family Medicine

## 2018-01-12 ENCOUNTER — Other Ambulatory Visit: Payer: Self-pay | Admitting: Family Medicine

## 2018-01-16 ENCOUNTER — Encounter: Payer: Self-pay | Admitting: Family Medicine

## 2018-01-20 ENCOUNTER — Encounter: Payer: Self-pay | Admitting: Family Medicine

## 2018-01-21 ENCOUNTER — Telehealth: Payer: Self-pay

## 2018-01-21 DIAGNOSIS — E785 Hyperlipidemia, unspecified: Secondary | ICD-10-CM

## 2018-01-22 NOTE — Telephone Encounter (Signed)
[name] scheduled labs for repatha 

## 2018-01-25 ENCOUNTER — Other Ambulatory Visit: Payer: Self-pay | Admitting: Family Medicine

## 2018-01-25 MED ORDER — TRAMADOL HCL 50 MG PO TABS: ORAL_TABLET | ORAL | 1 refills | Status: DC

## 2018-03-02 ENCOUNTER — Encounter: Payer: Self-pay | Admitting: Family Medicine

## 2018-03-05 ENCOUNTER — Telehealth: Payer: Self-pay | Admitting: *Deleted

## 2018-03-05 NOTE — Telephone Encounter (Signed)
   [facility] Pre-operative Risk Assessment    Request for surgical clearance:  1. What type of surgery is being performed? LEFT SHOULDER: TSA-TOTAL SHOULDER   2. When is this surgery scheduled? 03/18/18   3. What type of clearance is required (medical clearance vs. Pharmacy clearance to hold med vs. Both)? BOTH  4. Are there any medications that need to be held prior to surgery and how long? PLAVIX    5. Practice name and name of physician performing surgery?  [facility]; DR. Lennette Bihari SUPPLE   6. What is your office phone number [phone]    7.   What is your office fax number [phone]  8.   Anesthesia type (None, local, MAC, general) ? GENERAL   [name] 03/05/2018, 9:22 AM  _________________________________________________________________   (provider comments below)

## 2018-03-05 NOTE — Telephone Encounter (Signed)
Dr Marlou Porch -OK to hold Plavix 5-7 days pre op?  He is doing well, his PCI was July 2017.  Lurena Joiner

## 2018-03-05 NOTE — Telephone Encounter (Signed)
   Primary Cardiologist: Dr Marlou Porch  Chart reviewed and patient contacted by phone today as part of pre-operative protocol coverage. Based on ACC/AHA guidelines, NAVID LENZEN would be at acceptable risk for the planned procedure without further cardiovascular testing.   OK to hold Plavix 5-7 days pre op.  I will route this recommendation to the requesting party via Epic fax function and remove from pre-op pool.  Please call with questions.  Kerin Ransom, PA-C 03/05/2018, 4:44 PM

## 2018-03-05 NOTE — Telephone Encounter (Signed)
Ok to hold Plavix for up to 7 days prior to surgery. Thanks Candee Furbish, MD

## 2018-03-12 ENCOUNTER — Encounter (HOSPITAL_COMMUNITY): Payer: Self-pay

## 2018-03-12 NOTE — Patient Instructions (Addendum)
Your procedure is scheduled on: Thursday, Jan. 16, 2020   Surgery Time:  7:30AM-9:48 AM   Report to [facility] Main  Entrance    Report to admitting at 5:30  AM   Call this number if you have problems the morning of surgery [phone]   Do not eat food or drink liquids :After Midnight.   Brush your teeth the morning of surgery.   Do NOT smoke after Midnight   Take these medicines the morning of surgery with A SIP OF WATER: Amlodipine, Pantoprazole, Tramadol, Diazepam if needed   May use nasal spray and eye drops if needed                               You may not have any metal on your body including  jewelry, and body piercings             Do not wear lotions, powders, perfumes/cologne, or deodorant                           Men may shave face and neck.   Do not bring valuables to the hospital. [place].   Contacts, dentures or bridgework may not be worn into surgery.   Leave suitcase in the car. After surgery it may be brought to your room.    Special Instructions: Bring a copy of your healthcare power of attorney and living will documents         the day of surgery if you haven't scanned them in before.              Please read over the following fact sheets you were given:  [facility] - Preparing for Surgery Before surgery, you can play an important role.  Because skin is not sterile, your skin needs to be as free of germs as possible.  You can reduce the number of germs on your skin by washing with CHG (chlorahexidine gluconate) soap before surgery.  CHG is an antiseptic cleaner which kills germs and bonds with the skin to continue killing germs even after washing. Please DO NOT use if you have an allergy to CHG or antibacterial soaps.  If your skin becomes reddened/irritated stop using the CHG and inform your nurse when you arrive at Short Stay. Do not shave (including legs and underarms) for at least 48  hours prior to the first CHG shower.  You may shave your face/neck.  Please follow these instructions carefully:  1.  Shower with CHG Soap the night before surgery and the  morning of surgery.  2.  If you choose to wash your hair, wash your hair first as usual with your normal  shampoo.  3.  After you shampoo, rinse your hair and body thoroughly to remove the shampoo.                             4.  Use CHG as you would any other liquid soap.  You can apply chg directly to the skin and wash.  Gently with a scrungie or clean washcloth.  5.  Apply the CHG Soap to your body ONLY FROM THE NECK DOWN.   Do   not use on face/ open  Wound or open sores. Avoid contact with eyes, ears mouth and   genitals (private parts).                       Wash face,  Genitals (private parts) with your normal soap.             6.  Wash thoroughly, paying special attention to the area where your    surgery  will be performed.  7.  Thoroughly rinse your body with warm water from the neck down.  8.  DO NOT shower/wash with your normal soap after using and rinsing off the CHG Soap.                9.  Pat yourself dry with a clean towel.            10.  Wear clean pajamas.            11.  Place clean sheets on your bed the night of your first shower and do not  sleep with pets. Day of Surgery : Do not apply any lotions/deodorants the morning of surgery.  Please wear clean clothes to the hospital/surgery center.  FAILURE TO FOLLOW THESE INSTRUCTIONS MAY RESULT IN THE CANCELLATION OF YOUR SURGERY  PATIENT SIGNATURE_________________________________  NURSE SIGNATURE__________________________________  ________________________________________________________________________   Adam Phenix  An incentive spirometer is a tool that can help keep your lungs clear and active. This tool measures how well you are filling your lungs with each breath. Taking long deep breaths may help reverse or  decrease the chance of developing breathing (pulmonary) problems (especially infection) following:  A long period of time when you are unable to move or be active. BEFORE THE PROCEDURE   If the spirometer includes an indicator to show your best effort, your nurse or respiratory therapist will set it to a desired goal.  If possible, sit up straight or lean slightly forward. Try not to slouch.  Hold the incentive spirometer in an upright position. INSTRUCTIONS FOR USE  1. Sit on the edge of your bed if possible, or sit up as far as you can in bed or on a chair. 2. Hold the incentive spirometer in an upright position. 3. Breathe out normally. 4. Place the mouthpiece in your mouth and seal your lips tightly around it. 5. Breathe in slowly and as deeply as possible, raising the piston or the ball toward the top of the column. 6. Hold your breath for 3-5 seconds or for as long as possible. Allow the piston or ball to fall to the bottom of the column. 7. Remove the mouthpiece from your mouth and breathe out normally. 8. Rest for a few seconds and repeat Steps 1 through 7 at least 10 times every 1-2 hours when you are awake. Take your time and take a few normal breaths between deep breaths. 9. The spirometer may include an indicator to show your best effort. Use the indicator as a goal to work toward during each repetition. 10. After each set of 10 deep breaths, practice coughing to be sure your lungs are clear. If you have an incision (the cut made at the time of surgery), support your incision when coughing by placing a pillow or rolled up towels firmly against it. Once you are able to get out of bed, walk around indoors and cough well. You may stop using the incentive spirometer when instructed by your caregiver.  RISKS AND COMPLICATIONS  Take your time  so you do not get dizzy or light-headed.  If you are in pain, you may need to take or ask for pain medication before doing incentive spirometry.  It is harder to take a deep breath if you are having pain. AFTER USE  Rest and breathe slowly and easily.  It can be helpful to keep track of a log of your progress. Your caregiver can provide you with a simple table to help with this. If you are using the spirometer at home, follow these instructions: [place] IF:   You are having difficultly using the spirometer.  You have trouble using the spirometer as often as instructed.  Your pain medication is not giving enough relief while using the spirometer.  You develop fever of 100.5 F (38.1 C) or higher. SEEK IMMEDIATE MEDICAL CARE IF:   You cough up bloody sputum that had not been present before.  You develop fever of 102 F (38.9 C) or greater.  You develop worsening pain at or near the incision site. MAKE SURE YOU:   Understand these instructions.  Will watch your condition.  Will get help right away if you are not doing well or get worse. Document Released: 06/30/2006 Document Revised: 05/12/2011 Document Reviewed: 08/31/2006 [facility] Patient Information 2014 [place], [place].   ________________________________________________________________________

## 2018-03-12 NOTE — Pre-Procedure Instructions (Signed)
The following are in epic: Cardiac clearance Dr. Marlou Porch telephone encounter 03/05/2018 EKG 09/17/2017

## 2018-03-15 ENCOUNTER — Encounter (HOSPITAL_COMMUNITY): Payer: Self-pay

## 2018-03-15 ENCOUNTER — Encounter (HOSPITAL_COMMUNITY)
Admission: RE | Admit: 2018-03-15 | Discharge: 2018-03-15 | Disposition: A | Discharge status: Home / Self Care | Payer: Medicare Other | Source: Ambulatory Visit | Attending: Orthopedic Surgery | Admitting: Orthopedic Surgery

## 2018-03-15 ENCOUNTER — Other Ambulatory Visit: Payer: Self-pay

## 2018-03-15 DIAGNOSIS — Z01812 Encounter for preprocedural laboratory examination: Secondary | ICD-10-CM

## 2018-03-15 DIAGNOSIS — M19012 Primary osteoarthritis, left shoulder: Secondary | ICD-10-CM | POA: Insufficient documentation

## 2018-03-15 HISTORY — DX: Personal history of urinary calculi: Z87.442

## 2018-03-15 HISTORY — DX: Restless legs syndrome: G25.81

## 2018-03-15 HISTORY — DX: Other intervertebral disc displacement, lumbar region: M51.26

## 2018-03-15 HISTORY — DX: Diverticulosis of intestine, part unspecified, without perforation or abscess without bleeding: K57.90

## 2018-03-15 HISTORY — DX: Unspecified hearing loss, unspecified ear: H91.90

## 2018-03-15 HISTORY — DX: Tinnitus, unspecified ear: H93.19

## 2018-03-15 HISTORY — DX: Unspecified hydronephrosis: N13.30

## 2018-03-15 HISTORY — DX: Anxiety disorder, unspecified: F41.9

## 2018-03-15 LAB — CBC
HEMATOCRIT: 41.3 % (ref 39.0–52.0)
HEMOGLOBIN: 12.5 g/dL — AB (ref 13.0–17.0)
MCH: 27.1 pg (ref 26.0–34.0)
MCHC: 30.3 g/dL (ref 30.0–36.0)
MCV: 89.6 fL (ref 80.0–100.0)
Platelets: 295 10*3/uL (ref 150–400)
RBC: 4.61 MIL/uL (ref 4.22–5.81)
RDW: 13.9 % (ref 11.5–15.5)
WBC: 6.7 10*3/uL (ref 4.0–10.5)
nRBC: 0 % (ref 0.0–0.2)

## 2018-03-15 LAB — BASIC METABOLIC PANEL
ANION GAP: 8 (ref 5–15)
BUN: 9 mg/dL (ref 8–23)
CHLORIDE: 103 mmol/L (ref 98–111)
CO2: 28 mmol/L (ref 22–32)
Calcium: 8.9 mg/dL (ref 8.9–10.3)
Creatinine, Ser: 0.85 mg/dL (ref 0.61–1.24)
GFR calc Af Amer: >60 mL/min (ref >60)
GLUCOSE: 91 mg/dL (ref 70–99)
POTASSIUM: 4.4 mmol/L (ref 3.5–5.1)
Sodium: 139 mmol/L (ref 135–145)

## 2018-03-15 LAB — SURGICAL PCR SCREEN
MRSA, PCR: NEGATIVE
STAPHYLOCOCCUS AUREUS: NEGATIVE

## 2018-03-16 NOTE — Progress Notes (Signed)
Anesthesia Chart Review   Case:  [id number] Date/Time:  03/18/18 0715   Procedure:  TOTAL SHOULDER ARTHROPLASTY (Left ) - 131min   Anesthesia type:  General   Pre-op diagnosis:  left shoulder osteoarthritis   Location:  [facility] ROOM 07 / [facility]   Surgeon:  Justice Britain, MD      DISCUSSION:72 yo former smoker (quit 04/12/84) with h/o GERD, HLD, CAD, HTN, anxiety scheduled for above surgery on 03/18/18 with Dr. Onnie Graham.    S/p DES to LAD 08/08/2015.  Continues with plavix.  Followed by cardiology, Dr. Candee Furbish.  He was last seen on 09/17/17, doing well at this time.  Advised to follow up in 1 year. Dr. Kerin Ransom, PA-C with cardiology on 03/05/2018, "Based on ACC/AHA guidelines, WHITLEY STRYCHARZ would be at acceptable risk for the planned procedure without further cardiovascular testing.  Ok to hold Plavix 5-7 days pre op."  Pt can proceed with planned procedure barring acute status change.  VS: BP 118/78   Pulse 65   Temp 37.3 C (Oral)   Resp 16   Ht 5' 7.5" (1.715 m)   Wt 73.9 kg   SpO2 98%   BMI 25.15 kg/m   PROVIDERS: Tonia Ghent, MD is PCP last seen 12/27/17  Candee Furbish, MD is Cardiologist LABS: Labs reviewed: Acceptable for surgery. (all labs ordered are listed, but only abnormal results are displayed)  Labs Reviewed  CBC - Abnormal; Notable for the following components:      Result Value   Hemoglobin 12.5 (*)    All other components within normal limits  SURGICAL PCR SCREEN  BASIC METABOLIC PANEL     IMAGES:   EKG: 09/17/17  Rate 68 bpm Normal sinus rhythm  Normal ECG  CV: Stress Test 07/17/15  Nuclear stress EF: 53%.  There was no ST segment deviation noted during stress.  No T wave inversion was noted during stress.  Defect 1: There is a medium defect of mild severity present in the basal inferoseptal, mid inferoseptal and apical inferior location.  Findings consistent with mild ischemia.  This is an intermediate risk study.  The left ventricular  ejection fraction is mildly decreased (45-54%).  Cardiac Cath 08/08/2015 Mid LAD-1 lesion, 90% stenosed. Post intervention with a 3.5 x 20 Promus drug-eluting stent, postdilated to 3.6 mm, there is a 0% residual stenosis. Past Medical History:  Diagnosis Date  . Adjustment reaction with physical symptoms   . Anxiety   . Cancer, skin, squamous cell    R shoulder, removed per derm 2012 and chest  . Coronary artery disease    a. 05/2015 c/p post-op surgery; b. 07/2015 MV EF 53%, basal inferoseptal, mid inferoseptal, and apical inferior mild ischemia;  c. 08/2015 Cath/PCI: LM nl, LAD 61m (3.5x20 Promus DES), 45m, LCX nl, RCA nl, EF 60%.  . Diverticulosis 2018   Noted on colonoscopy  . Ear ringing    Left  . Elevated PSA    h/o with normal bx per URO  . GERD (gastroesophageal reflux disease)   . Hearing loss    Slight left ear  . History of BPH   . History of kidney stones   . Hydronephrosis    Left, urteral obstruction, 2-3 cm UVJ  . Hyperlipidemia   . Hypertension   . Nephrolithiasis   . OA (osteoarthritis)    with r hip replacement  . Osteoarthrosis involving, or with mention of more than one site, but not specified as generalized, site unspecified(715.80)   .  Restless legs syndrome (RLS)   . Ruptured lumbar disc   . Syncope    negative cardiac w/u per Dr Irish Lack  . Tick bite 2011   likely tick associated illness, treated with doxy with resolution,  . Viral exanthem, unspecified     Past Surgical History:  Procedure Laterality Date  . ACHILLES TENDON REPAIR Left 1984  . CARDIAC CATHETERIZATION N/A 08/08/2015   Procedure: Left Heart Cath and Coronary Angiography;  Surgeon: Jerline Pain, MD;  Location: [facility];  Service: Cardiovascular;  Laterality: N/A;  . CARDIAC CATHETERIZATION N/A 08/08/2015   Procedure: Coronary Stent Intervention;  Surgeon: Jettie Booze, MD;  Location: [facility];  Service: Cardiovascular;  Laterality: N/A;  . COLONOSCOPY  2018   . CORONARY STENT PLACEMENT  08/08/2015   MID LAD WITH DES  . JOINT REPLACEMENT    . SHOULDER ARTHROSCOPY  2012   R rotator cuff (biceps tear not repaired)  . SKIN CANCER EXCISION     right shoulder and mid chest  . TOTAL HIP ARTHROPLASTY     right  . TOTAL HIP ARTHROPLASTY Left 05/02/2015   Procedure: TOTAL LEFT HIP ARTHROPLASTY ANTERIOR APPROACH;  Surgeon: Gaynelle Arabian, MD;  Location: [facility];  Service: Orthopedics;  Laterality: Left;    MEDICATIONS: . Alcaftadine 0.25 % SOLN  . amLODipine (NORVASC) 10 MG tablet  . clopidogrel (PLAVIX) 75 MG tablet  . diazepam (VALIUM) 5 MG tablet  . Evolocumab (REPATHA SURECLICK) 671 MG/ML SOAJ  . hydrocortisone cream 1 %  . Liniments (SALONPAS PAIN RELIEF PATCH EX)  . nitroGLYCERIN (NITROSTAT) 0.4 MG SL tablet  . OVER THE COUNTER MEDICATION  . pantoprazole (PROTONIX) 40 MG tablet  . rOPINIRole (REQUIP) 0.5 MG tablet  . SIMPLY SALINE NA  . traMADol (ULTRAM) 50 MG tablet   No current facility-administered medications for this encounter.     Maia Plan [facility] Pre-Surgical Testing [phone] 03/16/18 4:15 PM

## 2018-03-16 NOTE — Anesthesia Preprocedure Evaluation (Addendum)
Anesthesia Evaluation  Patient identified by MRN, date of birth, ID band Patient awake    Reviewed: Allergy & Precautions, NPO status , Patient's Chart, lab work & pertinent test results  History of Anesthesia Complications Negative for: history of anesthetic complications  Airway Mallampati: III  TM Distance: <3 FB Neck ROM: Full    Dental   Pulmonary former smoker,    breath sounds clear to auscultation       Cardiovascular hypertension, (-) angina+ CAD and + Cardiac Stents   Rhythm:Regular     Neuro/Psych PSYCHIATRIC DISORDERS Anxiety negative neurological ROS     GI/Hepatic Neg liver ROS, GERD  Medicated and Controlled,  Endo/Other  negative endocrine ROS  Renal/GU Renal disease     Musculoskeletal  (+) Arthritis ,   Abdominal   Peds  Hematology negative hematology ROS (+)   Anesthesia Other Findings  Mid LAD lesion, 85% stenosed. 20% lesion approximate 20 mm past focal mid LAD lesion as described above.  Normal left ventricular function, EF 60% no wall motion abnormality  No aortic stenosis  Normal appearing coronary arteries otherwise. -- stented  Reproductive/Obstetrics                           Anesthesia Physical Anesthesia Plan  ASA: II  Anesthesia Plan: General and Regional   Post-op Pain Management:  Regional for Post-op pain   Induction: Intravenous  PONV Risk Score and Plan: 2 and Ondansetron and Dexamethasone  Airway Management Planned: Oral ETT  Additional Equipment: None  Intra-op Plan:   Post-operative Plan: Extubation in OR  Informed Consent: I have reviewed the patients History and Physical, chart, labs and discussed the procedure including the risks, benefits and alternatives for the proposed anesthesia with the patient or authorized representative who has indicated his/her understanding and acceptance.     Dental advisory given  Plan Discussed  with: CRNA and Surgeon  Anesthesia Plan Comments: (See PST note 03/15/18, Konrad Felix, PA-C)       Anesthesia Quick Evaluation

## 2018-03-18 ENCOUNTER — Encounter (HOSPITAL_COMMUNITY)
Admission: RE | Disposition: A | Discharge status: Home / Self Care | Payer: Self-pay | Source: Home / Self Care | Attending: Orthopedic Surgery

## 2018-03-18 ENCOUNTER — Inpatient Hospital Stay (HOSPITAL_COMMUNITY): Payer: Medicare Other | Admitting: Certified Registered"

## 2018-03-18 ENCOUNTER — Encounter (HOSPITAL_COMMUNITY): Payer: Self-pay | Admitting: Emergency Medicine

## 2018-03-18 ENCOUNTER — Inpatient Hospital Stay (HOSPITAL_COMMUNITY): Payer: Medicare Other | Admitting: Physician Assistant

## 2018-03-18 ENCOUNTER — Other Ambulatory Visit: Payer: Self-pay

## 2018-03-18 ENCOUNTER — Inpatient Hospital Stay (HOSPITAL_COMMUNITY)
Admission: RE | Admit: 2018-03-18 | Discharge: 2018-03-19 | DRG: 483 | Disposition: A | Discharge status: Home / Self Care | Payer: Medicare Other | Attending: Orthopedic Surgery | Admitting: Orthopedic Surgery

## 2018-03-18 DIAGNOSIS — M19012 Primary osteoarthritis, left shoulder: Secondary | ICD-10-CM | POA: Diagnosis not present

## 2018-03-18 DIAGNOSIS — G2581 Restless legs syndrome: Secondary | ICD-10-CM | POA: Diagnosis not present

## 2018-03-18 DIAGNOSIS — K219 Gastro-esophageal reflux disease without esophagitis: Secondary | ICD-10-CM | POA: Diagnosis present

## 2018-03-18 DIAGNOSIS — Z7902 Long term (current) use of antithrombotics/antiplatelets: Secondary | ICD-10-CM | POA: Diagnosis not present

## 2018-03-18 DIAGNOSIS — Z955 Presence of coronary angioplasty implant and graft: Secondary | ICD-10-CM

## 2018-03-18 DIAGNOSIS — Z87891 Personal history of nicotine dependence: Secondary | ICD-10-CM

## 2018-03-18 DIAGNOSIS — Z85828 Personal history of other malignant neoplasm of skin: Secondary | ICD-10-CM

## 2018-03-18 DIAGNOSIS — E785 Hyperlipidemia, unspecified: Secondary | ICD-10-CM | POA: Diagnosis not present

## 2018-03-18 DIAGNOSIS — Z79899 Other long term (current) drug therapy: Secondary | ICD-10-CM | POA: Diagnosis not present

## 2018-03-18 DIAGNOSIS — N400 Enlarged prostate without lower urinary tract symptoms: Secondary | ICD-10-CM | POA: Diagnosis not present

## 2018-03-18 DIAGNOSIS — Z96612 Presence of left artificial shoulder joint: Secondary | ICD-10-CM

## 2018-03-18 DIAGNOSIS — I2510 Atherosclerotic heart disease of native coronary artery without angina pectoris: Secondary | ICD-10-CM | POA: Diagnosis not present

## 2018-03-18 DIAGNOSIS — I10 Essential (primary) hypertension: Secondary | ICD-10-CM | POA: Diagnosis not present

## 2018-03-18 DIAGNOSIS — Z96641 Presence of right artificial hip joint: Secondary | ICD-10-CM | POA: Diagnosis present

## 2018-03-18 HISTORY — PX: TOTAL SHOULDER ARTHROPLASTY: SHX126

## 2018-03-18 SURGERY — ARTHROPLASTY, SHOULDER, TOTAL
Anesthesia: Regional | Site: Shoulder | Laterality: Left

## 2018-03-18 MED ORDER — 0.9 % SODIUM CHLORIDE (POUR BTL) OPTIME
TOPICAL | Status: DC | PRN
  Administered 2018-03-18: 1000 mL

## 2018-03-18 MED ORDER — CLOPIDOGREL BISULFATE 75 MG PO TABS
75.0000 mg | ORAL_TABLET | Freq: Every day | ORAL | Status: DC
  Administered 2018-03-19: 75 mg via ORAL
  Filled 2018-03-18: qty 1

## 2018-03-18 MED ORDER — ONDANSETRON HCL 4 MG PO TABS: 4.0000 mg | ORAL_TABLET | Freq: Four times a day (QID) | ORAL | Status: DC | PRN

## 2018-03-18 MED ORDER — FENTANYL CITRATE (PF) 100 MCG/2ML IJ SOLN: 25.0000 ug | INTRAMUSCULAR | Status: DC | PRN

## 2018-03-18 MED ORDER — PROPOFOL 10 MG/ML IV BOLUS
INTRAVENOUS | Status: DC | PRN
  Administered 2018-03-18: 120 mg via INTRAVENOUS
  Administered 2018-03-18: 40 mg via INTRAVENOUS

## 2018-03-18 MED ORDER — METHOCARBAMOL 500 MG PO TABS
500.0000 mg | ORAL_TABLET | Freq: Four times a day (QID) | ORAL | Status: DC | PRN
  Administered 2018-03-19: 500 mg via ORAL
  Filled 2018-03-18: qty 1

## 2018-03-18 MED ORDER — PANTOPRAZOLE SODIUM 40 MG PO TBEC
40.0000 mg | DELAYED_RELEASE_TABLET | Freq: Every day | ORAL | Status: DC
  Administered 2018-03-19: 40 mg via ORAL
  Filled 2018-03-18: qty 1

## 2018-03-18 MED ORDER — OXYCODONE HCL 5 MG/5ML PO SOLN
5.0000 mg | Freq: Once | ORAL | Status: DC | PRN
  Filled 2018-03-18: qty 5

## 2018-03-18 MED ORDER — EPHEDRINE 5 MG/ML INJ
INTRAVENOUS | Status: AC
  Filled 2018-03-18: qty 10

## 2018-03-18 MED ORDER — ROPINIROLE HCL 0.5 MG PO TABS
0.5000 mg | ORAL_TABLET | Freq: Every day | ORAL | Status: DC
  Administered 2018-03-18: 0.5 mg via ORAL
  Filled 2018-03-18: qty 1

## 2018-03-18 MED ORDER — HYDROMORPHONE HCL 1 MG/ML IJ SOLN: 0.5000 mg | INTRAMUSCULAR | Status: DC | PRN

## 2018-03-18 MED ORDER — OXYCODONE HCL 5 MG PO TABS: 10.0000 mg | ORAL_TABLET | ORAL | Status: DC | PRN

## 2018-03-18 MED ORDER — ONDANSETRON HCL 4 MG/2ML IJ SOLN: 4.0000 mg | Freq: Four times a day (QID) | INTRAMUSCULAR | Status: DC | PRN

## 2018-03-18 MED ORDER — BUPIVACAINE HCL (PF) 0.5 % IJ SOLN
INTRAMUSCULAR | Status: DC | PRN
  Administered 2018-03-18: 15 mL via PERINEURAL

## 2018-03-18 MED ORDER — KETOROLAC TROMETHAMINE 15 MG/ML IJ SOLN
7.5000 mg | Freq: Four times a day (QID) | INTRAMUSCULAR | Status: AC
  Administered 2018-03-18 – 2018-03-19 (×4): 7.5 mg via INTRAVENOUS
  Filled 2018-03-18 (×3): qty 1

## 2018-03-18 MED ORDER — ONDANSETRON HCL 4 MG/2ML IJ SOLN
INTRAMUSCULAR | Status: AC
  Filled 2018-03-18: qty 2

## 2018-03-18 MED ORDER — STERILE WATER FOR IRRIGATION IR SOLN
Status: DC | PRN
  Administered 2018-03-18: 2000 mL

## 2018-03-18 MED ORDER — EPHEDRINE SULFATE-NACL 50-0.9 MG/10ML-% IV SOSY
PREFILLED_SYRINGE | INTRAVENOUS | Status: DC | PRN
  Administered 2018-03-18: 5 mg via INTRAVENOUS

## 2018-03-18 MED ORDER — MENTHOL 3 MG MT LOZG: 1.0000 | LOZENGE | OROMUCOSAL | Status: DC | PRN

## 2018-03-18 MED ORDER — DEXAMETHASONE SODIUM PHOSPHATE 10 MG/ML IJ SOLN
INTRAMUSCULAR | Status: DC | PRN
  Administered 2018-03-18: 4 mg via INTRAVENOUS

## 2018-03-18 MED ORDER — ONDANSETRON HCL 4 MG/2ML IJ SOLN
INTRAMUSCULAR | Status: DC | PRN
  Administered 2018-03-18: 4 mg via INTRAVENOUS

## 2018-03-18 MED ORDER — DOCUSATE SODIUM 100 MG PO CAPS
100.0000 mg | ORAL_CAPSULE | Freq: Two times a day (BID) | ORAL | Status: DC
  Administered 2018-03-18 – 2018-03-19 (×2): 100 mg via ORAL
  Filled 2018-03-18 (×2): qty 1

## 2018-03-18 MED ORDER — PHENYLEPHRINE 40 MCG/ML (10ML) SYRINGE FOR IV PUSH (FOR BLOOD PRESSURE SUPPORT)
PREFILLED_SYRINGE | INTRAVENOUS | Status: AC
  Filled 2018-03-18: qty 20

## 2018-03-18 MED ORDER — TRAMADOL HCL 50 MG PO TABS
50.0000 mg | ORAL_TABLET | Freq: Four times a day (QID) | ORAL | Status: DC | PRN
  Administered 2018-03-19 (×2): 50 mg via ORAL
  Filled 2018-03-18 (×2): qty 1

## 2018-03-18 MED ORDER — FENTANYL CITRATE (PF) 250 MCG/5ML IJ SOLN
INTRAMUSCULAR | Status: DC | PRN
  Administered 2018-03-18 (×2): 50 ug via INTRAVENOUS

## 2018-03-18 MED ORDER — FLEET ENEMA 7-19 GM/118ML RE ENEM: 1.0000 | ENEMA | Freq: Once | RECTAL | Status: DC | PRN

## 2018-03-18 MED ORDER — ACETAMINOPHEN 160 MG/5ML PO SOLN: 1000.0000 mg | Freq: Once | ORAL | Status: DC | PRN

## 2018-03-18 MED ORDER — ALUM & MAG HYDROXIDE-SIMETH 200-200-20 MG/5ML PO SUSP: 30.0000 mL | ORAL | Status: DC | PRN

## 2018-03-18 MED ORDER — SODIUM CHLORIDE 0.9 % IV SOLN
INTRAVENOUS | Status: DC | PRN
  Administered 2018-03-18: 30 ug/min via INTRAVENOUS

## 2018-03-18 MED ORDER — PHENOL 1.4 % MT LIQD
1.0000 | OROMUCOSAL | Status: DC | PRN
  Filled 2018-03-18: qty 177

## 2018-03-18 MED ORDER — LIDOCAINE 2% (20 MG/ML) 5 ML SYRINGE
INTRAMUSCULAR | Status: DC | PRN
  Administered 2018-03-18: 80 mg via INTRAVENOUS

## 2018-03-18 MED ORDER — METHOCARBAMOL 500 MG IVPB - SIMPLE MED
500.0000 mg | Freq: Four times a day (QID) | INTRAVENOUS | Status: DC | PRN
  Filled 2018-03-18: qty 50

## 2018-03-18 MED ORDER — PHENYLEPHRINE 40 MCG/ML (10ML) SYRINGE FOR IV PUSH (FOR BLOOD PRESSURE SUPPORT)
PREFILLED_SYRINGE | INTRAVENOUS | Status: DC | PRN
  Administered 2018-03-18: 120 ug via INTRAVENOUS
  Administered 2018-03-18: 40 ug via INTRAVENOUS
  Administered 2018-03-18 (×2): 120 ug via INTRAVENOUS
  Administered 2018-03-18: 80 ug via INTRAVENOUS

## 2018-03-18 MED ORDER — NITROGLYCERIN 0.4 MG SL SUBL: 0.4000 mg | SUBLINGUAL_TABLET | SUBLINGUAL | Status: DC | PRN

## 2018-03-18 MED ORDER — PHENYLEPHRINE HCL 10 MG/ML IJ SOLN
INTRAMUSCULAR | Status: AC
  Filled 2018-03-18: qty 2

## 2018-03-18 MED ORDER — LIDOCAINE 2% (20 MG/ML) 5 ML SYRINGE
INTRAMUSCULAR | Status: AC
  Filled 2018-03-18: qty 5

## 2018-03-18 MED ORDER — TRANEXAMIC ACID-NACL 1000-0.7 MG/100ML-% IV SOLN
1000.0000 mg | INTRAVENOUS | Status: AC
  Administered 2018-03-18: 1000 mg via INTRAVENOUS
  Filled 2018-03-18: qty 100

## 2018-03-18 MED ORDER — FENTANYL CITRATE (PF) 250 MCG/5ML IJ SOLN
INTRAMUSCULAR | Status: AC
  Filled 2018-03-18: qty 5

## 2018-03-18 MED ORDER — MIDAZOLAM HCL 2 MG/2ML IJ SOLN
INTRAMUSCULAR | Status: AC
  Filled 2018-03-18: qty 2

## 2018-03-18 MED ORDER — ACETAMINOPHEN 10 MG/ML IV SOLN
INTRAVENOUS | Status: AC
  Administered 2018-03-18: 1000 mg via INTRAVENOUS
  Filled 2018-03-18: qty 100

## 2018-03-18 MED ORDER — ROCURONIUM BROMIDE 10 MG/ML (PF) SYRINGE
PREFILLED_SYRINGE | INTRAVENOUS | Status: AC
  Filled 2018-03-18: qty 10

## 2018-03-18 MED ORDER — DEXAMETHASONE SODIUM PHOSPHATE 10 MG/ML IJ SOLN
INTRAMUSCULAR | Status: AC
  Filled 2018-03-18: qty 1

## 2018-03-18 MED ORDER — CEFAZOLIN SODIUM-DEXTROSE 2-4 GM/100ML-% IV SOLN
2.0000 g | INTRAVENOUS | Status: AC
  Administered 2018-03-18: 2 g via INTRAVENOUS
  Filled 2018-03-18: qty 100

## 2018-03-18 MED ORDER — CHLORHEXIDINE GLUCONATE 4 % EX LIQD: 60.0000 mL | Freq: Once | CUTANEOUS | Status: DC

## 2018-03-18 MED ORDER — ACETAMINOPHEN 500 MG PO TABS: 1000.0000 mg | ORAL_TABLET | Freq: Once | ORAL | Status: DC | PRN

## 2018-03-18 MED ORDER — PROPOFOL 10 MG/ML IV BOLUS
INTRAVENOUS | Status: AC
  Filled 2018-03-18: qty 20

## 2018-03-18 MED ORDER — ACETAMINOPHEN 10 MG/ML IV SOLN
1000.0000 mg | Freq: Once | INTRAVENOUS | Status: DC | PRN
  Administered 2018-03-18: 1000 mg via INTRAVENOUS

## 2018-03-18 MED ORDER — SUGAMMADEX SODIUM 200 MG/2ML IV SOLN
INTRAVENOUS | Status: AC
  Filled 2018-03-18: qty 2

## 2018-03-18 MED ORDER — LACTATED RINGERS IV SOLN
INTRAVENOUS | Status: DC
  Administered 2018-03-18 – 2018-03-19 (×2): via INTRAVENOUS

## 2018-03-18 MED ORDER — AMLODIPINE BESYLATE 10 MG PO TABS
10.0000 mg | ORAL_TABLET | Freq: Every day | ORAL | Status: DC
  Administered 2018-03-19: 10 mg via ORAL
  Filled 2018-03-18: qty 1

## 2018-03-18 MED ORDER — LACTATED RINGERS IV SOLN
INTRAVENOUS | Status: DC | PRN
  Administered 2018-03-18: 08:00:00 via INTRAVENOUS

## 2018-03-18 MED ORDER — METOCLOPRAMIDE HCL 5 MG/ML IJ SOLN: 5.0000 mg | Freq: Three times a day (TID) | INTRAMUSCULAR | Status: DC | PRN

## 2018-03-18 MED ORDER — MIDAZOLAM HCL 2 MG/2ML IJ SOLN
INTRAMUSCULAR | Status: DC | PRN
  Administered 2018-03-18: 2 mg via INTRAVENOUS

## 2018-03-18 MED ORDER — OXYCODONE HCL 5 MG PO TABS
5.0000 mg | ORAL_TABLET | ORAL | Status: DC | PRN
  Administered 2018-03-19: 5 mg via ORAL
  Filled 2018-03-18: qty 1

## 2018-03-18 MED ORDER — TEMAZEPAM 15 MG PO CAPS: 15.0000 mg | ORAL_CAPSULE | Freq: Every evening | ORAL | Status: DC | PRN

## 2018-03-18 MED ORDER — BUPIVACAINE LIPOSOME 1.3 % IJ SUSP
INTRAMUSCULAR | Status: DC | PRN
  Administered 2018-03-18: 133 mg via PERINEURAL

## 2018-03-18 MED ORDER — ROCURONIUM BROMIDE 10 MG/ML (PF) SYRINGE
PREFILLED_SYRINGE | INTRAVENOUS | Status: DC | PRN
  Administered 2018-03-18: 50 mg via INTRAVENOUS

## 2018-03-18 MED ORDER — OXYCODONE HCL 5 MG PO TABS: 5.0000 mg | ORAL_TABLET | Freq: Once | ORAL | Status: DC | PRN

## 2018-03-18 MED ORDER — ACETAMINOPHEN 325 MG PO TABS
325.0000 mg | ORAL_TABLET | Freq: Four times a day (QID) | ORAL | Status: DC | PRN
  Filled 2018-03-18: qty 2

## 2018-03-18 MED ORDER — POLYETHYLENE GLYCOL 3350 17 G PO PACK: 17.0000 g | PACK | Freq: Every day | ORAL | Status: DC | PRN

## 2018-03-18 MED ORDER — SUGAMMADEX SODIUM 200 MG/2ML IV SOLN
INTRAVENOUS | Status: DC | PRN
  Administered 2018-03-18: 150 mg via INTRAVENOUS

## 2018-03-18 MED ORDER — BISACODYL 5 MG PO TBEC: 5.0000 mg | DELAYED_RELEASE_TABLET | Freq: Every day | ORAL | Status: DC | PRN

## 2018-03-18 MED ORDER — METOCLOPRAMIDE HCL 5 MG PO TABS: 5.0000 mg | ORAL_TABLET | Freq: Three times a day (TID) | ORAL | Status: DC | PRN

## 2018-03-18 SURGICAL SUPPLY — 61 items
BAG ZIPLOCK 12X15 (MISCELLANEOUS) ×2 IMPLANT
BIT DRILL 2.0 (BIT) ×2
BIT DRILL 2.0X110 (BIT) ×1 IMPLANT
BIT DRILL 2.0X128 (BIT) ×2 IMPLANT
BLADE SAW SGTL 83.5X18.5 (BLADE) ×2 IMPLANT
CEMENT BONE DEPUY (Cement) ×2 IMPLANT
COVER SURGICAL LIGHT HANDLE (MISCELLANEOUS) ×2 IMPLANT
COVER WAND RF STERILE (DRAPES) IMPLANT
DERMABOND ADVANCED (GAUZE/BANDAGES/DRESSINGS) ×1
DERMABOND ADVANCED .7 DNX12 (GAUZE/BANDAGES/DRESSINGS) ×1 IMPLANT
DRAPE ORTHO SPLIT 77X108 STRL (DRAPES) ×2
DRAPE POUCH INSTRU U-SHP 10X18 (DRAPES) ×2 IMPLANT
DRAPE SURG 17X11 SM STRL (DRAPES) ×2 IMPLANT
DRAPE SURG ORHT 6 SPLT 77X108 (DRAPES) ×2 IMPLANT
DRAPE U-SHAPE 47X51 STRL (DRAPES) ×2 IMPLANT
DRSG AQUACEL AG ADV 3.5X10 (GAUZE/BANDAGES/DRESSINGS) ×2 IMPLANT
DURAPREP 26ML APPLICATOR (WOUND CARE) ×4 IMPLANT
ELECT BLADE TIP CTD 4 INCH (ELECTRODE) ×2 IMPLANT
ELECT REM PT RETURN 15FT ADLT (MISCELLANEOUS) ×2 IMPLANT
FACESHIELD WRAPAROUND (MASK) ×6 IMPLANT
FACESHIELD WRAPAROUND OR TEAM (MASK) ×3 IMPLANT
GLENOID UNI VAULTLOCK LRG (Shoulder) ×1 IMPLANT
GLOVE BIO SURGEON STRL SZ7.5 (GLOVE) ×2 IMPLANT
GLOVE BIO SURGEON STRL SZ8 (GLOVE) ×2 IMPLANT
GLOVE SS BIOGEL STRL SZ 7 (GLOVE) ×1 IMPLANT
GLOVE SS BIOGEL STRL SZ 7.5 (GLOVE) ×1 IMPLANT
GLOVE SUPERSENSE BIOGEL SZ 7 (GLOVE) ×1
GLOVE SUPERSENSE BIOGEL SZ 7.5 (GLOVE) ×1
GOWN STRL REUS W/ TWL XL LVL3 (GOWN DISPOSABLE) ×2 IMPLANT
GOWN STRL REUS W/TWL XL LVL3 (GOWN DISPOSABLE) ×2
HEAD HUMERAL 19X50 UNIVERS (Joint) ×1 IMPLANT
KIT BASIN OR (CUSTOM PROCEDURE TRAY) ×2 IMPLANT
KIT SET UNIVERSAL (KITS) ×1 IMPLANT
MANIFOLD NEPTUNE II (INSTRUMENTS) ×2 IMPLANT
MARKER SKIN DUAL TIP RULER LAB (MISCELLANEOUS) ×2 IMPLANT
NDL TAPERED W/ NITINOL LOOP (MISCELLANEOUS) ×1 IMPLANT
NEEDLE TAPERED W/ NITINOL LOOP (MISCELLANEOUS) ×2 IMPLANT
NS IRRIG 1000ML POUR BTL (IV SOLUTION) ×2 IMPLANT
PACK GENERAL/GYN (CUSTOM PROCEDURE TRAY) ×2 IMPLANT
PROTECTOR NERVE ULNAR (MISCELLANEOUS) ×2 IMPLANT
SLING ARM FOAM STRAP LRG (SOFTGOODS) ×2 IMPLANT
SLING ARM FOAM STRAP MED (SOFTGOODS) ×2 IMPLANT
SMARTMIX MINI TOWER (MISCELLANEOUS)
SPONGE LAP 4X18 RFD (DISPOSABLE) IMPLANT
STAPLER VISISTAT 35W (STAPLE) IMPLANT
STEM HUMERAL APEX UNI 12MM (Stem) ×1 IMPLANT
SUCTION FRAZIER HANDLE 12FR (TUBING) ×1
SUCTION TUBE FRAZIER 12FR DISP (TUBING) ×1 IMPLANT
SUT FIBERWIRE #2 38 T-5 BLUE (SUTURE) ×4
SUT MNCRL AB 3-0 PS2 18 (SUTURE) ×2 IMPLANT
SUT MON AB 2-0 CT1 36 (SUTURE) ×2 IMPLANT
SUT VIC AB 1 CT1 36 (SUTURE) ×4 IMPLANT
SUT VIC AB 2-0 CT1 27 (SUTURE) ×1
SUT VIC AB 2-0 CT1 TAPERPNT 27 (SUTURE) ×1 IMPLANT
SUTURE FIBERWR #2 38 T-5 BLUE (SUTURE) ×2 IMPLANT
SUTURE TAPE 1.3 40 TPR END (SUTURE) ×5 IMPLANT
SUTURETAPE 1.3 40 TPR END (SUTURE) ×6
TOWEL OR 17X26 10 PK STRL BLUE (TOWEL DISPOSABLE) ×2 IMPLANT
TOWEL OR NON WOVEN STRL DISP B (DISPOSABLE) ×2 IMPLANT
TOWER SMARTMIX MINI (MISCELLANEOUS) IMPLANT
WATER STERILE IRR 1000ML POUR (IV SOLUTION) ×4 IMPLANT

## 2018-03-18 NOTE — Op Note (Signed)
03/18/2018  9:38 AM  PATIENT:   [name].  73 y.o. male  PRE-OPERATIVE DIAGNOSIS:  left shoulder osteoarthritis  POST-OPERATIVE DIAGNOSIS: Same  PROCEDURE: Left anatomic total shoulder arthroplasty utilizing a press-fit size 12 Arthrex stem, a 50 x 19 concentric head, and a large glenoid  SURGEON:  Abdiaziz Klahn, Metta Clines M.D.  ASSISTANTS: Jenetta Loges, PA-C  ANESTHESIA:   General endotracheal as well as interscalene block with Exparel  EBL: 150 cc  SPECIMEN: None  Drains: None   PATIENT DISPOSITION:  PACU - hemodynamically stable.    PLAN OF CARE: Discharge to home after PACU  Brief history:  Is a 73 year old gentleman whose had chronic and progressively increasing left shoulder pain related to end stage osteoarthritis.  Plain radiographs confirm complete obliteration of joint space with subchondral sclerosis and peripheral osteophyte formation.  Due to his increasing pain and functional rotations he is brought to the operating this time for planned left total shoulder arthroplasty  Preoperatively I had counseled Mr. [name] regarding treatment options as well as the potential risks versus benefits thereof.  Possible surgical complications were reviewed including bleeding, infection, neurovascular injury, persistent pain, loss of motion, anesthetic complication, failure the implant, and possible need for additional surgery.  He understands and accepts and agrees with her planned procedure.  Procedure in detail:  After undergoing routine preop evaluation patient received prophylactic antibiotics and interscalene block was established in the holding area by the anesthesia department.  Placed supine on the op table underwent smooth induction of a general endotracheal anesthesia.  Placed into the beachchair position and appropriate padding protected.  Left shoulder girdle region was sterilely prepped and draped in standard fashion.  Timeout was called.  An anterior deltopectoral  approach left shoulder was made through an 8 cm incision.  Skin flaps were elevated dissection carried deeply deltopectoral interval was developed from proximal distal with a vein taken laterally.  Upper centimeter the pectoralis major tendon was tenotomized for exposure the conjoined tendon was mobilized and retracted medially.  Long head bicep tendon was then tenodesed at the upper border the pectoralis major tendon was then tenotomized proximally unroofed and excised.  We then outlined the insertion of the subscapularis to the lesser tuberosity and then performed a lesser tuberosity osteotomy using oscillating saw and then mobilized the capsule attachments from the anterior infra margins of the humeral neck allowing deliver the humeral head to the wound.  We outlined our proposed humeral head resection with the extra medullary guide and this was then performed with an oscillating saw taking care to protect the rotator cuff superiorly and posteriorly.  Rondure was then used to remove the osteophytes from the margins of the humeral neck.  The humeral canal was then prepared with hand reaming up to size 7 and then broaching up to size 12 with subsequently a size 11 stem with metal cap placed into the canal for protection.  We then exposed the glenoid with combination of Fukuda, pitchfork, inspecting retractors.  The proximal biceps stump as well as the labrum was excised in its entirety gaining complete visualization the periphery of the glenoid.  This was then sized at a large.  A guidepin was then placed into the center of the glenoid and the glenoid was reamed to a stable subchondral bony bed and our central drill hole was placed using the guide to then place the superior and inferior peg and slot respectively.  Glenoid was then broached and trial reduction showed good fit.  The glenoid was then meticulously cleaned and dried.  This point cement was then introduced into the superior and inferior peg and slot  respectively.  We placed some morselized bone around the central peg of the glenoid.  The glenoid was then impacted with excellent fit and fixation.  We then returned our attention to the proximal humerus where drill holes were placed through the metaphysis one medial to lateral to the LTO.  Passing suture was then placed through these drill holes.  At this point then our implant was provisionally seated we passed the sutures for our LTL repair through the drill holes and then terminal seating of the implant was achieved with excellent fit and fixation and the proximal locking screws were then tightened.  We then performed a series of trial reductions and ultimately found that the 50 x 19 concentric head gave [place] excellent soft tissue balance good stability and approximate 50% translation of the humeral head on the glenoid.  Our trial was then removed the [facility] taper was cleaned and dried the final implant was then impacted into position.  Final reduction was performed again very pleased with the overall soft tissue balance.  Final reduction was then completed the subscapularis and the LTO we then brought back into the position and our suture limbs were then passed medial to the bone fragment at the bone tendon junction and we used our standard construct allowing [place] to horizontal and 2 crossing sutures allowing excellent re-apposition of the LTO to the lesser tuberosity donor site.  The arm then easily achieved 45 degrees of external rotation without excessive tension on the repair.  We then closed the rotator interval with a series of three #2 FiberWire sutures.  The overall soft tissue balance was much to our satisfaction.  Wound was then irrigated.  Final hemostasis was obtained.  Should mention that we did ligate the cephalic vein due to compromise identified during surgery.  At the completion of the case and there was excellent hemostasis.  The deltopectoral interval was then reapproximated with a series of  figure-of-eight number Vicryl sutures.  2-0 Monocryl used for the subcu layer and intracuticular 3 Monocryl for the skin followed by Dermabond and an Aquasol dressing left arm was placed into a sling immobilizer and the patient was awakened extubated taken to the recovery room in stable condition  Jenetta Loges, PA-C was used as an Environmental consultant throughout this case essential for help with positioning of the patient, position extremity, tissue ablation, implantation of the prosthesis, wound closure, and intraoperative decision-making.  Metta Clines Kynslei Art MD   Contact # [phone]

## 2018-03-18 NOTE — Transfer of Care (Signed)
Immediate Anesthesia Transfer of Care Note  Patient: [name].  Procedure(s) Performed: TOTAL SHOULDER ARTHROPLASTY (Left Shoulder)  Patient Location: PACU  Anesthesia Type:General  Level of Consciousness: awake, alert  and oriented  Airway & Oxygen Therapy: Patient Spontanous Breathing and Patient connected to face mask oxygen  Post-op Assessment: Report given to RN and Post -op Vital signs reviewed and stable  Post vital signs: Reviewed and stable  Last Vitals:  Vitals Value Taken Time  BP 124/95 03/18/2018  9:47 AM  Temp    Pulse    Resp    SpO2    Vitals shown include unvalidated device data.  Last Pain:  Vitals:   03/18/18 0607  TempSrc: Oral  PainSc:       Patients Stated Pain Goal: 4 (52/48/18 5909)  Complications: No apparent anesthesia complications

## 2018-03-18 NOTE — Anesthesia Procedure Notes (Signed)
Procedure Name: Intubation Date/Time: 03/18/2018 7:45 AM Performed by: Niel Hummer, CRNA Pre-anesthesia Checklist: Patient being monitored, Suction available, Emergency Drugs available and Patient identified Patient Re-evaluated:Patient Re-evaluated prior to induction Oxygen Delivery Method: Circle system utilized Preoxygenation: Pre-oxygenation with 100% oxygen Induction Type: IV induction Ventilation: Mask ventilation without difficulty and Oral airway inserted - appropriate to patient size Laryngoscope Size: Mac and 4 Grade View: Grade I Tube type: Oral Tube size: 7.5 mm Number of attempts: 1 Airway Equipment and Method: Stylet Placement Confirmation: ETT inserted through vocal cords under direct vision,  positive ETCO2 and breath sounds checked- equal and bilateral Secured at: 22 cm Tube secured with: Tape Dental Injury: Teeth and Oropharynx as per pre-operative assessment

## 2018-03-18 NOTE — H&P (Signed)
[name].    Chief Complaint: left shoulder osteoarthritis HPI: The patient is a 73 y.o. male with end stage lef shoulder OA  Past Medical History:  Diagnosis Date  . Adjustment reaction with physical symptoms   . Anxiety   . Cancer, skin, squamous cell    R shoulder, removed per derm 2012 and chest  . Coronary artery disease    a. 05/2015 c/p post-op surgery; b. 07/2015 MV EF 53%, basal inferoseptal, mid inferoseptal, and apical inferior mild ischemia;  c. 08/2015 Cath/PCI: LM nl, LAD 75m (3.5x20 Promus DES), 35m, LCX nl, RCA nl, EF 60%.  . Diverticulosis 2018   Noted on colonoscopy  . Ear ringing    Left  . Elevated PSA    h/o with normal bx per URO  . GERD (gastroesophageal reflux disease)   . Hearing loss    Slight left ear  . History of BPH   . History of kidney stones   . Hydronephrosis    Left, urteral obstruction, 2-3 cm UVJ  . Hyperlipidemia   . Hypertension   . Nephrolithiasis   . OA (osteoarthritis)    with r hip replacement  . Osteoarthrosis involving, or with mention of more than one site, but not specified as generalized, site unspecified(715.80)   . Restless legs syndrome (RLS)   . Ruptured lumbar disc   . Syncope    negative cardiac w/u per Dr Irish Lack  . Tick bite 2011   likely tick associated illness, treated with doxy with resolution,  . Viral exanthem, unspecified     Past Surgical History:  Procedure Laterality Date  . ACHILLES TENDON REPAIR Left 1984  . CARDIAC CATHETERIZATION N/A 08/08/2015   Procedure: Left Heart Cath and Coronary Angiography;  Surgeon: Jerline Pain, MD;  Location: [facility];  Service: Cardiovascular;  Laterality: N/A;  . CARDIAC CATHETERIZATION N/A 08/08/2015   Procedure: Coronary Stent Intervention;  Surgeon: Jettie Booze, MD;  Location: [facility];  Service: Cardiovascular;  Laterality: N/A;  . COLONOSCOPY  2018  . CORONARY STENT PLACEMENT  08/08/2015   MID LAD WITH DES  . JOINT REPLACEMENT    .  SHOULDER ARTHROSCOPY  2012   R rotator cuff (biceps tear not repaired)  . SKIN CANCER EXCISION     right shoulder and mid chest  . TOTAL HIP ARTHROPLASTY     right  . TOTAL HIP ARTHROPLASTY Left 05/02/2015   Procedure: TOTAL LEFT HIP ARTHROPLASTY ANTERIOR APPROACH;  Surgeon: Gaynelle Arabian, MD;  Location: [facility];  Service: Orthopedics;  Laterality: Left;    Family History  Problem Relation Age of Onset  . Alzheimer's disease Father   . Arrhythmia Mother   . Prostate cancer Maternal Uncle   . Colon cancer Neg Hx   . Heart attack Neg Hx   . Hypertension Neg Hx     Social History:  reports that he quit smoking about 33 years ago. His smoking use included cigarettes. He smoked 1.00 pack per day. He has never used smokeless tobacco. He reports that he does not drink alcohol or use drugs.   Medications Prior to Admission  Medication Sig Dispense Refill  . Alcaftadine 0.25 % SOLN Place 1 drop into both eyes daily as needed (LASTACAFT - itchy eyes).     Marland Kitchen amLODipine (NORVASC) 10 MG tablet TAKE 1 TABLET BY MOUTH  DAILY 90 tablet 3  . clopidogrel (PLAVIX) 75 MG tablet TAKE 1 TABLET BY MOUTH  DAILY  WITH BREAKFAST 90 tablet 3  . diazepam (VALIUM) 5 MG tablet TAKE 1 TO 2 TABLETS BY  MOUTH EVERY 12 HOURS AS  NEEDED FOR ANXIETY AND FOR  INSOMNIA (Patient taking differently: Take 2.5 mg by mouth at bedtime as needed for anxiety (sleep). ) 90 tablet 1  . Evolocumab (REPATHA SURECLICK) 449 MG/ML SOAJ Inject 1 pen into the skin every 14 (fourteen) days. 2 pen 11  . hydrocortisone cream 1 % Apply 1 application topically daily as needed for itching.    . Liniments (SALONPAS PAIN RELIEF PATCH EX) Apply 1 patch topically daily as needed (pain).    . nitroGLYCERIN (NITROSTAT) 0.4 MG SL tablet Place 1 tablet (0.4 mg total) under the tongue every 5 (five) minutes as needed for chest pain. 25 tablet 3  . OVER THE COUNTER MEDICATION Apply 1 application topically daily as needed (pain - PENETREX Cream).    .  pantoprazole (PROTONIX) 40 MG tablet TAKE 1 TABLET BY MOUTH  DAILY 90 tablet 1  . rOPINIRole (REQUIP) 0.5 MG tablet TAKE 1 TABLET BY MOUTH AT  BEDTIME 90 tablet 1  . SIMPLY SALINE NA Place 1 spray into both nostrils 2 (two) times daily.     . traMADol (ULTRAM) 50 MG tablet TAKE 1 TO 2 TABLETS BY  MOUTH EVERY 8 HOURS AS  NEEDED FOR MODERATE PAIN OR SEVERE PAIN. (Patient taking differently: Take 50-100 mg by mouth every 8 (eight) hours as needed (pain). TAKE 1 TO 2 TABLETS BY  MOUTH EVERY 8 HOURS AS  NEEDED FOR MODERATE PAIN OR SEVERE PAIN.) 180 tablet 1     Physical Exam: left shoulder with painful and restricted motion as noted at recent office visits  Vitals     Assessment/Plan  Impression: left shoulder osteoarthritis  Plan of Action: Procedure(s): TOTAL SHOULDER ARTHROPLASTY  Winfrey Chillemi M Pranay Hilbun 03/18/2018, 6:00 AM Contact # [phone]

## 2018-03-18 NOTE — Anesthesia Procedure Notes (Signed)
Anesthesia Regional Block: Interscalene brachial plexus block   Pre-Anesthetic Checklist: ,, timeout performed, Correct Patient, Correct Site, Correct Laterality, Correct Procedure, Correct Position, site marked, Risks and benefits discussed,  Surgical consent,  Pre-op evaluation,  At surgeon's request and post-op pain management  Laterality: Left and Upper  Prep: chloraprep       Needles:  Injection technique: Single-shot     Needle Length: 5cm  Needle Gauge: 22     Additional Needles: Arrow StimuQuik ECHO Echogenic Stimulating PNB Needle  Procedures:,,,, ultrasound used (permanent image in chart),,,,  Narrative:  Start time: 03/18/2018 7:18 AM End time: 03/18/2018 7:24 AM Injection made incrementally with aspirations every 5 mL.  Performed by: Personally  Anesthesiologist: Oleta Mouse, MD

## 2018-03-19 MED ORDER — CYCLOBENZAPRINE HCL 10 MG PO TABS: 10.0000 mg | ORAL_TABLET | Freq: Three times a day (TID) | ORAL | 1 refills | Status: DC | PRN

## 2018-03-19 MED ORDER — TRAMADOL HCL 50 MG PO TABS: 50.0000 mg | ORAL_TABLET | Freq: Four times a day (QID) | ORAL | 0 refills | Status: DC | PRN

## 2018-03-19 MED ORDER — OXYCODONE-ACETAMINOPHEN 5-325 MG PO TABS: 1.0000 | ORAL_TABLET | ORAL | 0 refills | Status: DC | PRN

## 2018-03-19 MED ORDER — ONDANSETRON HCL 4 MG PO TABS: 4.0000 mg | ORAL_TABLET | Freq: Three times a day (TID) | ORAL | 0 refills | Status: DC | PRN

## 2018-03-19 NOTE — Progress Notes (Signed)
Patient discharged to home w/ family. Given all belongings, instructions, prescriptions, equipment. Escorted to pov via w/c.

## 2018-03-19 NOTE — Discharge Instructions (Signed)
Metta Clines. Supple, M.D., F.A.A.O.S. Orthopaedic Surgery Specializing in Arthroscopic and Reconstructive Surgery of the Shoulder and Knee [phone] [address] [place], [place] [address] - Fax [phone]   POST-OP TOTAL SHOULDER REPLACEMENT INSTRUCTIONS  1. Call the office at [phone] to schedule your first post-op appointment 10-14 days from the date of your surgery.  2. The bandage over your incision is waterproof. You may begin showering with this dressing on. You may leave this dressing on until first follow up appointment within 2 weeks. We prefer you leave this dressing in place until follow up however after 5-7 days if you are having itching or skin irritation and would like to remove it you may do so. Go slow and tug at the borders gently to break the bond the dressing has with the skin. At this point if there is no drainage it is okay to go without a bandage or you may cover it with a light guaze and tape. You can also expect significant bruising around your shoulder that will drift down your arm and into your chest wall. This is very normal and should resolve over several days.   3. Wear your sling/immobilizer at all times except to perform the exercises below or to occasionally let your arm dangle by your side to stretch your elbow. You also need to sleep in your sling immobilizer until instructed otherwise. It is ok to remove your sling if you are sitting in a controlled environment and allow your arm to rest in a position of comfort by your side or on your lap with pillows to give your neck and skin a break from the sling. You may remove it to allow arm to dangle by side to shower. If you are up walking around and when you go to sleep at night you need to wear it.  4. Range of motion to your elbow, wrist, and hand are encouraged 3-5 times daily. Exercise to your hand and fingers helps to reduce swelling you may experience.  5. Utilize ice to the shoulder 3-5  times minimum a day and additionally if you are experiencing pain.  6. Prescriptions for a pain medication and a muscle relaxant are provided for you. It is recommended that if you are experiencing pain that you pain medication alone is not controlling, add the muscle relaxant along with the pain medication which can give additional pain relief. The first 1-2 days is generally the most severe of your pain and then should gradually decrease. As your pain lessens it is recommended that you decrease your use of the pain medications to an "as needed basis'" only and to always comply with the recommended dosages of the pain medications.  7. Pain medications can produce constipation along with their use. If you experience this, the use of an over the counter stool softener or laxative daily is recommended.   8. For additional questions or concerns, please do not hesitate to call the office. If after hours there is an answering service to forward your concerns to the physician on call.  9.Pain control following an exparel block  To help control your post-operative pain you received a nerve block  performed with Exparel which is a long acting anesthetic (numbing agent) which can provide pain relief and sensations of numbness (and relief of pain) in the operative shoulder and arm for up to 3 days. Sometimes it provides mixed relief, meaning you may still have numbness in certain areas of the arm but  can still be able to move  parts of that arm, hand, and fingers. We recommend that your prescribed pain medications  be used as needed. We do not feel it is necessary to "pre medicate" and "stay ahead" of pain.  Taking narcotic pain medications when you are not having any pain can lead to unnecessary and potentially dangerous side effects.    POST-OP EXERCISES  Pendulum Exercises  Perform pendulum exercises while standing and bending at the waist. Support your uninvolved arm on a table or chair and allow your  operated arm to hang freely. Make sure to do these exercises passively - not using you shoulder muscles.  Repeat 20 times. Do 3 sessions per day.

## 2018-03-19 NOTE — Plan of Care (Signed)
  Problem: Clinical Measurements: Goal: Respiratory complications will improve Outcome: Progressing   Problem: Clinical Measurements: Goal: Cardiovascular complication will be avoided Outcome: Progressing   Problem: Elimination: Goal: Will not experience complications related to bowel motility Outcome: Progressing   Problem: Pain Managment: Goal: General experience of comfort will improve Outcome: Progressing   

## 2018-03-19 NOTE — Evaluation (Signed)
Occupational Therapy Evaluation Patient Details Name: [name]. MRN: [id number] DOB: [birth year] Today's Date: 03/19/2018    History of Present Illness s/p L TSA, h/o anxiety, CAD, ruptured lumbar disk   Clinical Impression   This 73 year old man was admitted for the above. At baseline, he is independent with adls.  All education was completed; however, wife was not present. She has used a sling immobilizer herself in the past.  I will stop back for further education with her, if needed.    Follow Up Recommendations  Follow surgeon's recommendation for DC plan and follow-up therapies    Equipment Recommendations  None recommended by OT    Recommendations for Other Services       Precautions / Restrictions Precautions Precautions: Shoulder Type of Shoulder Precautions: may come out of sling in controlled environment, may use arm for adls within parameters of PROM 20 ER, 45 ABD, and 60 FF.  May perform gentle pendulums and exercise elbow, wrist and fingers Required Braces or Orthoses: (sling immobilizer) Restrictions Weight Bearing Restrictions: Yes LUE Weight Bearing: Non weight bearing Other Position/Activity Restrictions: L NWB      Mobility Bed Mobility Overal bed mobility: Modified Independent             General bed mobility comments: hob raised  Transfers Overall transfer level: Independent                    Balance                                           ADL either performed or assessed with clinical judgement   ADL Overall ADL's : Needs assistance/impaired Eating/Feeding: Independent   Grooming: Set up   Upper Body Bathing: Minimal assistance   Lower Body Bathing: Minimal assistance   Upper Body Dressing : Moderate assistance   Lower Body Dressing: Moderate assistance   Toilet Transfer: Independent   Toileting- Clothing Manipulation and Hygiene: Supervision/safety         General ADL Comments: performed  ADL; able to use LUE to assist passively. Wife can assist at home.       Vision         Perception     Praxis      Pertinent Vitals/Pain Pain Assessment: 0-10 Pain Score: 6  Pain Location: L TSA Pain Descriptors / Indicators: Aching Pain Intervention(s): Monitored during session;Limited activity within patient's tolerance;Repositioned;Patient requesting pain meds-RN notified;Premedicated before session;Ice applied     Hand Dominance  mostly R   Extremity/Trunk Assessment Upper Extremity Assessment Upper Extremity Assessment: LUE deficits/detail LUE Deficits / Details: worked passively within parameters for adls; performed pendulums with cues for passively.  Pt has back issues, but able to perform without increased pain  Block partially in effect           Communication Communication Communication: No difficulties   Cognition Arousal/Alertness: Awake/alert Behavior During Therapy: WFL for tasks assessed/performed Overall Cognitive Status: Within Functional Limits for tasks assessed                                     General Comments       Exercises Exercises: (performed pendulums, elbow-finger ROM)  Educated on seated, cradling pendulums if back is aggravated with standing pendulums   Shoulder  Instructions      Home Living Family/patient expects to be discharged to:: Private residence Living Arrangements: Spouse/significant other                 Bathroom Shower/Tub: Occupational psychologist: Handicapped height                Prior Functioning/Environment Level of Independence: Independent                 OT Problem List: Pain;Decreased range of motion;Decreased strength      OT Treatment/Interventions: Self-care/ADL training;DME and/or AE instruction;Patient/family education;Therapeutic activities    OT Goals(Current goals can be found in the care plan section) Acute Rehab OT Goals Patient Stated Goal: none  stated OT Goal Formulation: With patient Time For Goal Achievement: 03/26/18 Potential to Achieve Goals: Good ADL Goals Additional ADL Goal #1: wife will verbalize/demonstrate sling application and assistance for adls  OT Frequency: Min 2X/week   Barriers to D/C:            Co-evaluation              AM-PAC OT "6 Clicks" Daily Activity     Outcome Measure Help from another person eating meals?: None Help from another person taking care of personal grooming?: A Little Help from another person toileting, which includes using toliet, bedpan, or urinal?: A Little Help from another person bathing (including washing, rinsing, drying)?: A Little Help from another person to put on and taking off regular upper body clothing?: A Little Help from another person to put on and taking off regular lower body clothing?: A Lot 6 Click Score: 18   End of Session    Activity Tolerance: Patient tolerated treatment well Patient left: in bed;with call bell/phone within reach  OT Visit Diagnosis: Pain Pain - Right/Left: Left Pain - part of body: Shoulder                Time: 1610-9604 OT Time Calculation (min): 44 min Charges:  OT General Charges $OT Visit: 1 Visit OT Evaluation $OT Eval Low Complexity: 1 Low OT Treatments $Self Care/Home Management : 8-22 mins $Therapeutic Exercise: 8-22 mins  Lesle Chris, OTR/L Acute Rehabilitation Services [phone] [facility] pager [phone] office 03/19/2018  [place] 03/19/2018, 10:42 AM

## 2018-03-23 ENCOUNTER — Encounter: Payer: Self-pay | Admitting: Family Medicine

## 2018-03-23 ENCOUNTER — Encounter (HOSPITAL_COMMUNITY): Payer: Self-pay | Admitting: Orthopedic Surgery

## 2018-03-24 NOTE — Discharge Summary (Signed)
PATIENT ID:      [name].  MRN:     [id number] DOB/AGE:    [birth year] / 73 y.o.     DISCHARGE SUMMARY  ADMISSION DATE:    03/18/2018 DISCHARGE DATE:  03/19/2018  ADMISSION DIAGNOSIS: left shoulder osteoarthritis Past Medical History:  Diagnosis Date  . Adjustment reaction with physical symptoms   . Anxiety   . Cancer, skin, squamous cell    R shoulder, removed per derm 2012 and chest  . Coronary artery disease    a. 05/2015 c/p post-op surgery; b. 07/2015 MV EF 53%, basal inferoseptal, mid inferoseptal, and apical inferior mild ischemia;  c. 08/2015 Cath/PCI: LM nl, LAD 29m (3.5x20 Promus DES), 40m, LCX nl, RCA nl, EF 60%.  . Diverticulosis 2018   Noted on colonoscopy  . Ear ringing    Left  . Elevated PSA    h/o with normal bx per URO  . GERD (gastroesophageal reflux disease)   . Hearing loss    Slight left ear  . History of BPH   . History of kidney stones   . Hydronephrosis    Left, urteral obstruction, 2-3 cm UVJ  . Hyperlipidemia   . Hypertension   . Nephrolithiasis   . OA (osteoarthritis)    with r hip replacement  . Osteoarthrosis involving, or with mention of more than one site, but not specified as generalized, site unspecified(715.80)   . Restless legs syndrome (RLS)   . Ruptured lumbar disc   . Syncope    negative cardiac w/u per Dr Irish Lack  . Tick bite 2011   likely tick associated illness, treated with doxy with resolution,  . Viral exanthem, unspecified     DISCHARGE DIAGNOSIS:   Active Problems:   S/P shoulder replacement, left   PROCEDURE: Procedure(s): TOTAL SHOULDER ARTHROPLASTY on 03/18/2018  CONSULTS:    HISTORY:  See H&P in chart.  HOSPITAL COURSE:  [name]. is a 73 y.o. admitted on 03/18/2018 with a diagnosis of left shoulder osteoarthritis.  They were brought to the operating room on 03/18/2018 and underwent Procedure(s): TOTAL SHOULDER ARTHROPLASTY.    They were given perioperative antibiotics:  Anti-infectives (From  admission, onward)   Start     Dose/Rate Route Frequency Ordered Stop   03/18/18 0600  ceFAZolin (ANCEF) IVPB 2g/100 mL premix     2 g 200 mL/hr over 30 Minutes Intravenous On call to O.R. 03/18/18 4098 03/18/18 0817    .  Patient underwent the above named procedure and tolerated it well. The following day they were hemodynamically stable and pain was controlled on oral analgesics. They were neurovascularly intact to the operative extremity. OT was ordered and worked with patient per protocol. They were medically and orthopaedically stable for discharge on 03/19/2018.     DIAGNOSTIC STUDIES:  RECENT RADIOGRAPHIC STUDIES :  No results found.  RECENT VITAL SIGNS:  No data found.Marland Kitchen  RECENT EKG RESULTS:    Orders placed or performed in visit on 09/17/17  . EKG 12-Lead    DISCHARGE INSTRUCTIONS:    DISCHARGE MEDICATIONS:   Allergies as of 03/19/2018      Reactions   Xarelto [rivaroxaban] Rash   Rash and itching with blisters.   Alprazolam Other (See Comments)   abnormal dreams   Benadryl Allergy [diphenhydramine Hcl]    "makes me nuts" , hyper    Doxycycline Other (See Comments)   GI upset   Haldol [haloperidol] Other (See Comments)   Worsens restless leg  syndrome   Lipitor [atorvastatin] Other (See Comments)   Joints ached   Morphine And Related Other (See Comments)   intolerant   Tamsulosin Other (See Comments)   Malaise/ lethargy   Zolpidem Tartrate Other (See Comments)   Terrible dreams    Crestor [rosuvastatin Calcium] Other (See Comments)   Paranoid    Zetia [ezetimibe] Diarrhea      Medication List    TAKE these medications   Alcaftadine 0.25 % Soln Place 1 drop into both eyes daily as needed (LASTACAFT - itchy eyes).   amLODipine 10 MG tablet Commonly known as:  NORVASC TAKE 1 TABLET BY MOUTH  DAILY   clopidogrel 75 MG tablet Commonly known as:  PLAVIX TAKE 1 TABLET BY MOUTH  DAILY WITH BREAKFAST   cyclobenzaprine 10 MG tablet Commonly known as:   FLEXERIL Take 1 tablet (10 mg total) by mouth 3 (three) times daily as needed for muscle spasms.   diazepam 5 MG tablet Commonly known as:  VALIUM TAKE 1 TO 2 TABLETS BY  MOUTH EVERY 12 HOURS AS  NEEDED FOR ANXIETY AND FOR  INSOMNIA What changed:  See the new instructions.   Evolocumab 140 MG/ML Soaj Commonly known as:  REPATHA SURECLICK Inject 1 pen into the skin every 14 (fourteen) days.   hydrocortisone cream 1 % Apply 1 application topically daily as needed for itching.   nitroGLYCERIN 0.4 MG SL tablet Commonly known as:  NITROSTAT Place 1 tablet (0.4 mg total) under the tongue every 5 (five) minutes as needed for chest pain.   ondansetron 4 MG tablet Commonly known as:  ZOFRAN Take 1 tablet (4 mg total) by mouth every 8 (eight) hours as needed for nausea or vomiting.   OVER THE COUNTER MEDICATION Apply 1 application topically daily as needed (pain - PENETREX Cream).   oxyCODONE-acetaminophen 5-325 MG tablet Commonly known as:  PERCOCET Take 1 tablet by mouth every 4 (four) hours as needed for severe pain (max 6 q).   pantoprazole 40 MG tablet Commonly known as:  PROTONIX TAKE 1 TABLET BY MOUTH  DAILY   rOPINIRole 0.5 MG tablet Commonly known as:  REQUIP TAKE 1 TABLET BY MOUTH AT  BEDTIME   SALONPAS PAIN RELIEF PATCH EX Apply 1 patch topically daily as needed (pain).   SIMPLY SALINE NA Place 1 spray into both nostrils 2 (two) times daily.   traMADol 50 MG tablet Commonly known as:  ULTRAM Take 1 tablet (50 mg total) by mouth every 6 (six) hours as needed (prn pain1-4). What changed:    how much to take  how to take this  when to take this  reasons to take this  additional instructions       FOLLOW UP VISIT:   Follow-up Information    Justice Britain, MD.   Specialty:  Orthopedic Surgery Why:  call to be seen in 10-14 days Contact information: [address] [place] [address] [phone]           DISCHARGE  JS:EGBT   DISCHARGE CONDITION:  [name] for Dr. Justice Britain 03/24/2018, 11:33 AM

## 2018-03-29 NOTE — Anesthesia Postprocedure Evaluation (Signed)
Anesthesia Post Note  Patient: Kenyetta Fife.  Procedure(s) Performed: TOTAL SHOULDER ARTHROPLASTY (Left Shoulder)     Patient location during evaluation: PACU Anesthesia Type: Regional and General Level of consciousness: awake and alert Pain management: pain level controlled Vital Signs Assessment: post-procedure vital signs reviewed and stable Respiratory status: spontaneous breathing, nonlabored ventilation, respiratory function stable and patient connected to nasal cannula oxygen Cardiovascular status: blood pressure returned to baseline and stable Postop Assessment: no apparent nausea or vomiting Anesthetic complications: no    Last Vitals:  Vitals:   03/19/18 0017 03/19/18 0525  BP: 128/81 134/87  Pulse: 80 71  Resp: 20 16  Temp: 36.8 C 36.9 C  SpO2: 97% 96%    Last Pain:  Vitals:   03/19/18 1015  TempSrc:   PainSc: 5                  Arlee Bossard

## 2018-04-06 ENCOUNTER — Encounter: Payer: Self-pay | Admitting: Family Medicine

## 2018-04-08 ENCOUNTER — Other Ambulatory Visit: Payer: Self-pay | Admitting: Family Medicine

## 2018-04-08 MED ORDER — TRAMADOL HCL 50 MG PO TABS: 50.0000 mg | ORAL_TABLET | Freq: Three times a day (TID) | ORAL | 1 refills | Status: DC | PRN

## 2018-04-27 ENCOUNTER — Other Ambulatory Visit: Payer: Self-pay | Admitting: Family Medicine

## 2018-04-28 NOTE — Telephone Encounter (Signed)
Okay to continue.  Sent.  Thanks. 

## 2018-04-28 NOTE — Telephone Encounter (Signed)
Name of Medication: Diazepam Name of Pharmacy: [facility] Last Fill or Written Date and Quantity: 11/26/17 #90/1 Last Office Visit and Type: 02/23/18 CPE Next Office Visit and Type: None scheduled Last Controlled Substance Agreement Date: 10/22/12 Last UDS:10/22/12 See allergy/contrainidcation

## 2018-05-16 ENCOUNTER — Encounter: Payer: Self-pay | Admitting: Family Medicine

## 2018-05-18 ENCOUNTER — Other Ambulatory Visit: Payer: Self-pay | Admitting: Family Medicine

## 2018-05-27 ENCOUNTER — Encounter: Payer: Self-pay | Admitting: Family Medicine

## 2018-06-05 ENCOUNTER — Encounter: Payer: Self-pay | Admitting: Family Medicine

## 2018-06-09 ENCOUNTER — Encounter: Payer: Self-pay | Admitting: Family Medicine

## 2018-06-10 ENCOUNTER — Ambulatory Visit (INDEPENDENT_AMBULATORY_CARE_PROVIDER_SITE_OTHER): Payer: Medicare Other | Admitting: Family Medicine

## 2018-06-10 ENCOUNTER — Encounter: Payer: Self-pay | Admitting: Family Medicine

## 2018-06-10 ENCOUNTER — Telehealth: Payer: Self-pay | Admitting: Family Medicine

## 2018-06-10 DIAGNOSIS — F419 Anxiety disorder, unspecified: Secondary | ICD-10-CM | POA: Diagnosis not present

## 2018-06-10 DIAGNOSIS — M158 Other polyosteoarthritis: Secondary | ICD-10-CM

## 2018-06-10 MED ORDER — DIAZEPAM 5 MG PO TABS: ORAL_TABLET | ORAL | Status: DC

## 2018-06-10 NOTE — Progress Notes (Signed)
Interactive audio and video telecommunications were attempted between this provider and patient, however failed, due to patient having technical difficulties OR patient did not have access to video capability.  We continued and completed visit with audio only.   Virtual Visit via Telephone Note  I connected with patient on 06/10/18 at 2:36 PM by telephone and verified that I am speaking with the correct person using two identifiers.  Location of patient: home  Location of MD: [place] Name of referring provider (if blank then none associated): Names per persons and role in encounter:  MD: Earlyne Iba, Patient: name listed above.    I discussed the limitations, risks, security and privacy concerns of performing an evaluation and management service by telephone and the availability of in person appointments. I also discussed with the patient that there may be a patient responsible charge related to this service. The patient expressed understanding and agreed to proceed.  History of Present Illness:   Prev mychart message d/w pt.   He has shoulder pain with f/u at ortho PT pending.  He is going to start home PT at that.  He is using icy hot and trying to put up with the pain.  He is using tramadol as needed.  We talked about slowly trying to taper down on that given his back/hip/shoulder pain.    We talked about stressors and anxiety.  He is sig family stressors noted.  He has gone to counseling prev.  He has cut back on valium.  He has taken pictures of family down in the meantime, to decrease triggering his anxiety and frustration.  His wife is supportive.    He has zero plan or thoughts of hurting himself or anyone else.  He has no manic hx or sx.  He has a normal sleep cycle.    RLS is controlled with requip at night.  No new neurologic sx.  He wouldn't need MRI imaging, d/w pt.     Observations/Objective:NAD, speaking in complete sentences.  Judgement intact.    Assessment  and Plan:   Anxiety Shoulder and back/hip pain.  No change in meds at this point except for tapering BZD in stead of quitting cold [place], taking 1/2 tab BID for 1 week, then 1/2 tab a day thereafter.    Then try to gradually taper the tramadol if possible.  He may still need that due to his back and hip pain.   Follow Up Instructions:call back as needed.     I discussed the assessment and treatment plan with the patient. The patient was provided an opportunity to ask questions and all were answered. The patient agreed with the plan and demonstrated an understanding of the instructions.   The patient was advised to call back or seek an in-person evaluation if the symptoms worsen or if the condition fails to improve as anticipated.  I provided 39minutes of non-face-to-face time during this encounter.  Elsie Stain, MD

## 2018-06-10 NOTE — Telephone Encounter (Signed)
Please call pt and schedule phone or web visit about mood/valium use.  Thanks.

## 2018-06-10 NOTE — Telephone Encounter (Signed)
lvm for pt to call us back and schedule phone or web appt.

## 2018-06-10 NOTE — Assessment & Plan Note (Addendum)
D/w pt about medication and nonmedication options.  Taper Valium, 1/2 tab BID for 1 week, then 1/2 tab a day thereafter.  Update me as needed.  He is going to counseling previously.  No suicidal homicidal intent.  Still okay for outpatient follow-up.  He is taken down pictures of family members that would be a trigger for anxiety. >25 minutes spent in time with patient, >50% spent in counselling or coordination of care

## 2018-06-13 NOTE — Assessment & Plan Note (Signed)
After trying to taper diazepam, then he can try to gradually taper the tramadol if possible.  He may still need that due to his back and hip pain.  He agrees with plan.

## 2018-06-16 ENCOUNTER — Encounter: Payer: Self-pay | Admitting: Family Medicine

## 2018-06-17 NOTE — Telephone Encounter (Signed)
Last written 04-08-18 #180/1 Last OV 12-24-17 [facility] No Future OV [facility] [address]

## 2018-06-18 ENCOUNTER — Telehealth: Payer: Self-pay | Admitting: Family Medicine

## 2018-06-18 ENCOUNTER — Other Ambulatory Visit: Payer: Self-pay | Admitting: Family Medicine

## 2018-06-18 ENCOUNTER — Encounter: Payer: Self-pay | Admitting: Family Medicine

## 2018-06-18 MED ORDER — TRAMADOL HCL 50 MG PO TABS: 50.0000 mg | ORAL_TABLET | Freq: Three times a day (TID) | ORAL | 1 refills | Status: DC | PRN

## 2018-06-18 NOTE — Telephone Encounter (Signed)
Tramadol rx sent.  Thanks.

## 2018-06-18 NOTE — Telephone Encounter (Signed)
Electronic refill request  Diazepam LR 04/28/18 #90 Requip LR 09/17/17 #90/1 Last office visit 06/10/18

## 2018-06-20 NOTE — Telephone Encounter (Signed)
requip sent.   I thought he was tapering off diazepam.  Please clarify his use/need on that.  Thanks.

## 2018-06-22 NOTE — Telephone Encounter (Signed)
Spoken and notified patient of Dr Duncan's comments. Patient verbalized understanding.  

## 2018-06-22 NOTE — Telephone Encounter (Signed)
He was but due to staying at home and stress. His sleeping pattern has been bad and he is noticed increase in anxiety. Patient asked for a refill for this time. He is taking 1 or 2 tablet daily as needed for sleep

## 2018-06-22 NOTE — Telephone Encounter (Signed)
Pt had virtual appt on 06/10/18.

## 2018-06-22 NOTE — Telephone Encounter (Signed)
Noted, sent.  Just have him use the least amount possible, as needed.  Thanks .

## 2018-08-14 ENCOUNTER — Encounter: Payer: Self-pay | Admitting: Family Medicine

## 2018-08-19 ENCOUNTER — Other Ambulatory Visit: Payer: Self-pay | Admitting: Family Medicine

## 2018-08-19 MED ORDER — TRAMADOL HCL 50 MG PO TABS: 50.0000 mg | ORAL_TABLET | Freq: Three times a day (TID) | ORAL | 1 refills | Status: DC | PRN

## 2018-09-12 ENCOUNTER — Other Ambulatory Visit: Payer: Self-pay | Admitting: Family Medicine

## 2018-09-16 ENCOUNTER — Encounter: Payer: Self-pay | Admitting: Family Medicine

## 2018-09-16 ENCOUNTER — Other Ambulatory Visit: Payer: Self-pay | Admitting: *Deleted

## 2018-09-16 NOTE — Telephone Encounter (Signed)
Patient requests refill of Tramadol Last office visit:   06/10/2018 Last Filled:    180 tablet 1 08/19/2018  Please advise.

## 2018-09-17 NOTE — Telephone Encounter (Signed)
Please verify with pharmacy.  He should have 1 refill left.  Thanks.

## 2018-09-20 ENCOUNTER — Telehealth: Payer: Self-pay | Admitting: Family Medicine

## 2018-09-20 NOTE — Telephone Encounter (Signed)
Patient states in a message on MyChart that he does have a refill remaining.

## 2018-09-20 NOTE — Telephone Encounter (Signed)
See mychart message.  Please check with pharmacy.  Patient should still have a refill on his tramadol.  Thanks.

## 2018-10-12 ENCOUNTER — Other Ambulatory Visit: Payer: Self-pay | Admitting: Cardiology

## 2018-10-17 ENCOUNTER — Encounter: Payer: Self-pay | Admitting: Family Medicine

## 2018-10-19 ENCOUNTER — Other Ambulatory Visit: Payer: Self-pay | Admitting: Family Medicine

## 2018-10-19 MED ORDER — TRAMADOL HCL 50 MG PO TABS: 50.0000 mg | ORAL_TABLET | Freq: Three times a day (TID) | ORAL | 1 refills | Status: DC | PRN

## 2018-10-26 ENCOUNTER — Encounter: Payer: Self-pay | Admitting: Family Medicine

## 2018-11-12 ENCOUNTER — Encounter: Payer: Self-pay | Admitting: Family Medicine

## 2018-11-14 ENCOUNTER — Other Ambulatory Visit: Payer: Self-pay | Admitting: Family Medicine

## 2018-11-17 ENCOUNTER — Other Ambulatory Visit: Payer: Self-pay | Admitting: Family Medicine

## 2018-11-17 ENCOUNTER — Encounter: Payer: Self-pay | Admitting: Family Medicine

## 2018-11-18 NOTE — Telephone Encounter (Signed)
Electronic refill request. DIazepam Last office visit:   06/10/2018 Last Filled:    90 tablet 0 06/22/2018  Please advise.

## 2018-11-19 NOTE — Telephone Encounter (Signed)
Sent. Thanks.   

## 2018-12-01 ENCOUNTER — Encounter: Payer: Self-pay | Admitting: Cardiology

## 2018-12-01 ENCOUNTER — Ambulatory Visit (INDEPENDENT_AMBULATORY_CARE_PROVIDER_SITE_OTHER): Payer: Medicare Other | Admitting: Cardiology

## 2018-12-01 ENCOUNTER — Other Ambulatory Visit: Payer: Self-pay

## 2018-12-01 VITALS — BP 118/78 | HR 68 | Ht 67.5 in | Wt 165.2 lb

## 2018-12-01 DIAGNOSIS — I2510 Atherosclerotic heart disease of native coronary artery without angina pectoris: Secondary | ICD-10-CM | POA: Diagnosis not present

## 2018-12-01 DIAGNOSIS — E785 Hyperlipidemia, unspecified: Secondary | ICD-10-CM

## 2018-12-01 DIAGNOSIS — Z789 Other specified health status: Secondary | ICD-10-CM | POA: Diagnosis not present

## 2018-12-01 NOTE — Progress Notes (Signed)
Cardiology Office Note    Date:  12/01/2018   ID:  Keith Swango., DOB 1945-10-03, MRN [id number]  PCP:  Keith Ghent, MD  Cardiologist:   Keith Furbish, MD     History of Present Illness:  [name]. is a 73 y.o. male with coronary artery disease status post LAD drug-eluting stent placement on 08/08/15 after abnormal stress test here for follow-up. Also has essential hypertension, hyperlipidemia-on Repatha, GERD.  Interestingly, in review of phone note from 09/11/15, about 5 weeks after cardiac catheterization he was feeling paranoid and suspicious. He was worried that he may be having a reaction to Crestor. He stopped taking Crestor, Plavix, Toprol resolved and when he does not take the medications he feels better. He was instructed to go back on the Plavix by [name].  On 11/20/15 he emailed [name] stating that emotionally he was not doing very well.  Since stopping the Crestor, he is feeling much better.   Took the Zetia, LDL was down to 101. Makes him sick, diarrhea. Stopped. Fruit. Vegs. He is very [employer] attorney. He sometimes wears ankle brace and knee braces. He previously asked about Celebrex, should not take. No chest Fritz, no shortness of breath him a no syncope, no bleeding.  09/17/2017- overall been doing quite well, no syncope bleeding orthopnea PND chest Fritz. [name]. Shoulder likely needed replacement. Hips done.   12/01/2018- here for hyperlipidemia follow-up.  Doing quite well.  Taking the [place].  Last LDL 25.  Excellent.  Had a shoulder surgery.  Denies any fevers chills nausea vomiting syncope bleeding.   Past Medical History:  Diagnosis Date  . Adjustment reaction with physical symptoms   . Anxiety   . Cancer, skin, squamous cell    R shoulder, removed per derm 2012 and chest  . Coronary artery disease    a. 05/2015 c/p post-op surgery; b. 07/2015 MV EF 53%, basal inferoseptal, mid inferoseptal, and apical inferior mild ischemia;  c. 08/2015  Cath/PCI: LM nl, LAD 28m (3.5x20 Promus DES), 1m, LCX nl, RCA nl, EF 60%.  . Diverticulosis 2018   Noted on colonoscopy  . Ear ringing    Left  . Elevated PSA    h/o with normal bx per URO  . GERD (gastroesophageal reflux disease)   . Hearing loss    Slight left ear  . History of BPH   . History of kidney stones   . Hydronephrosis    Left, urteral obstruction, 2-3 cm UVJ  . Hyperlipidemia   . Hypertension   . Nephrolithiasis   . OA (osteoarthritis)    with r hip replacement  . Osteoarthrosis involving, or with mention of more than one site, but not specified as generalized, site unspecified(715.80)   . Restless legs syndrome (RLS)   . Ruptured lumbar disc   . Syncope    negative cardiac w/u per [name]  . Tick bite 2011   likely tick associated illness, treated with doxy with resolution,  . Viral exanthem, unspecified     Past Surgical History:  Procedure Laterality Date  . ACHILLES TENDON REPAIR Left 1984  . CARDIAC CATHETERIZATION N/A 08/08/2015   Procedure: Left Heart Cath and Coronary Angiography;  Surgeon: Keith Pain, MD;  Location: [facility];  Service: Cardiovascular;  Laterality: N/A;  . CARDIAC CATHETERIZATION N/A 08/08/2015   Procedure: Coronary Stent Intervention;  Surgeon: Keith Booze, MD;  Location: [facility];  Service: Cardiovascular;  Laterality:  N/A;  . COLONOSCOPY  2018  . CORONARY STENT PLACEMENT  08/08/2015   MID LAD WITH DES  . JOINT REPLACEMENT    . SHOULDER ARTHROSCOPY  2012   R rotator cuff (biceps tear not repaired)  . SKIN CANCER EXCISION     right shoulder and mid chest  . TOTAL HIP ARTHROPLASTY     right  . TOTAL HIP ARTHROPLASTY Left 05/02/2015   Procedure: TOTAL LEFT HIP ARTHROPLASTY ANTERIOR APPROACH;  Surgeon: Keith Arabian, MD;  Location: [facility];  Service: Orthopedics;  Laterality: Left;  . TOTAL SHOULDER ARTHROPLASTY Left 03/18/2018   Procedure: TOTAL SHOULDER ARTHROPLASTY;  Surgeon: Keith Britain, MD;   Location: [facility];  Service: Orthopedics;  Laterality: Left;  136min    Current Medications: Outpatient Medications Prior to Visit  Medication Sig Dispense Refill  . Alcaftadine 0.25 % SOLN Place 1 drop into both eyes daily as needed (LASTACAFT - itchy eyes).     Marland Kitchen amLODipine (NORVASC) 10 MG tablet TAKE 1 TABLET BY MOUTH  DAILY 90 tablet 3  . clopidogrel (PLAVIX) 75 MG tablet TAKE 1 TABLET BY MOUTH  DAILY WITH BREAKFAST 90 tablet 0  . cyclobenzaprine (FLEXERIL) 10 MG tablet Take 1 tablet (10 mg total) by mouth 3 (three) times daily as needed for muscle spasms. 30 tablet 1  . diazepam (VALIUM) 5 MG tablet TAKE 1 TO 2 TABLETS BY  MOUTH EVERY 12 HOURS AS  NEEDED FOR ANXIETY AND FOR  INSOMNIA 90 tablet 0  . hydrocortisone cream 1 % Apply 1 application topically daily as needed for itching.    . Liniments (SALONPAS Fritz RELIEF PATCH EX) Apply 1 patch topically daily as needed (Fritz).    . nitroGLYCERIN (NITROSTAT) 0.4 MG SL tablet Place 1 tablet (0.4 mg total) under the tongue every 5 (five) minutes as needed for chest Fritz. 25 tablet 3  . ondansetron (ZOFRAN) 4 MG tablet Take 1 tablet (4 mg total) by mouth every 8 (eight) hours as needed for nausea or vomiting. 10 tablet 0  . OVER THE COUNTER MEDICATION Apply 1 application topically daily as needed (Fritz - PENETREX Cream).    . pantoprazole (PROTONIX) 40 MG tablet TAKE 1 TABLET BY MOUTH  DAILY 90 tablet 1  . REPATHA SURECLICK [id number] MG/ML SOAJ INJECT 1 PEN UNDER THE SKIN EVERY 14 DAYS 2 pen 11  . rOPINIRole (REQUIP) 0.5 MG tablet TAKE 1 TABLET BY MOUTH AT  BEDTIME 90 tablet 0  . SIMPLY SALINE NA Place 1 spray into both nostrils 2 (two) times daily.     . traMADol (ULTRAM) 50 MG tablet Take 1-2 tablets (50-100 mg total) by mouth every 8 (eight) hours as needed (Fritz). 180 tablet 1   No facility-administered medications prior to visit.      Allergies:   Xarelto [rivaroxaban], Alprazolam, Benadryl allergy [diphenhydramine hcl], Doxycycline, Haldol  [haloperidol], Lipitor [atorvastatin], Morphine and related, Tamsulosin, Zolpidem tartrate, Crestor [rosuvastatin calcium], and Zetia [ezetimibe]   Social History   Socioeconomic History  . Marital status: Married    Spouse name: Not on file  . Number of children: Not on file  . Years of education: Not on file  . Highest education level: Not on file  Occupational History  . Occupation: Lobbyist: [employer]  Social Needs  . Financial resource strain: Not on file  . Food insecurity    Worry: Not on file    Inability: Not on file  . Transportation needs  Medical: Not on file    Non-medical: Not on file  Tobacco Use  . Smoking status: Former Smoker    Packs/day: 1.00    Types: Cigarettes    Quit date: 04/12/1984    Years since quitting: 34.6  . Smokeless tobacco: Never Used  Substance and Sexual Activity  . Alcohol use: No    Alcohol/week: 0.0 standard drinks  . Drug use: No  . Sexual activity: Yes  Lifestyle  . Physical activity    Days per week: Not on file    Minutes per session: Not on file  . Stress: Not on file  Relationships  . Social Herbalist on phone: Not on file    Gets together: Not on file    Attends religious service: Not on file    Active member of club or organization: Not on file    Attends meetings of clubs or organizations: Not on file    Relationship status: Not on file  Other Topics Concern  . Not on file  Social History Narrative   Went to [place], previous [employer] ('67[address]70, [place], [place])   Distant smoking hx, quit 10+ years ago   Regular exercise: yes   Father and Mother lived with him and his family until they needed NH placement- 2010/05/28   Father died late 2010/05/28 with dementia.     Retired.     His youngest daughter is dead     Family History:  The patient's family history includes Alzheimer's disease in his father; Arrhythmia in his mother; Prostate cancer in his maternal uncle.   ROS:   Please see  the history of present illness.    Review of Systems  All other systems reviewed and are negative.    PHYSICAL EXAM:   VS:  BP 118/78   Pulse 68   Ht 5' 7.5" (1.715 m)   Wt 165 lb 3.2 oz (74.9 kg)   SpO2 98%   BMI 25.49 kg/m    GEN: Well nourished, well developed, in no acute distress  HEENT: normal  Neck: no JVD, carotid bruits, or masses Cardiac: RRR; no murmurs, rubs, or gallops,no edema  Respiratory:  clear to auscultation bilaterally, normal work of breathing GI: soft, nontender, nondistended, + BS MS: no deformity or atrophy  Skin: warm and dry, no rash Neuro:  Alert and Oriented x 3, Strength and sensation are intact Psych: euthymic mood, full affect    Wt Readings from Last 3 Encounters:  12/01/18 165 lb 3.2 oz (74.9 kg)  03/18/18 163 lb (73.9 kg)  03/15/18 163 lb (73.9 kg)      Studies/Labs Reviewed:   EKG: 12/01/2018-sinus rhythm short PR 68.  09/17/2017-sinus rhythm 68 with no other abnormalities.  Personally viewed.  Recent Labs: 12/22/2017: ALT 11 03/15/2018: BUN 9; Creatinine, Ser 0.85; Hemoglobin 12.5; Platelets 295; Potassium 4.4; Sodium 139   Lipid Panel    Component Value Date/Time   CHOL 101 12/22/2017 0908   TRIG 139.0 12/22/2017 0908   HDL 48.30 12/22/2017 0908   CHOLHDL 2 12/22/2017 0908   VLDL 27.8 12/22/2017 0908   LDLCALC 25 12/22/2017 0908   LDLDIRECT 135.6 09/10/2011 0835    Additional studies/ records that were reviewed today include:  Hospital notes, cardiac catheterization, EKGs reviewed.    ASSESSMENT:    1. Coronary artery disease involving native coronary artery of native heart without angina pectoris   2. Statin intolerance   3. Hyperlipidemia, unspecified hyperlipidemia type  PLAN:  In order of problems listed above:  Coronary artery disease -LAD DES 08/08/2015.  Doing well, no anginal symptoms.  Very appreciative of Keith. Irish Fritz and [facility] in the Cath Lab. -He is not having any issues.  Taking his medications.   Hypertension - Overall good control, medications reviewed.  Doing well.  Hyperlipidemia - Repatha q 2weeks. LDL 25.  Excellent.  GERD -On proton pump inhibitor, Protonix.  Stable.  1 year follow-up  Medication Adjustments/Labs and Tests Ordered: Current medicines are reviewed at length with the patient today.  Concerns regarding medicines are outlined above.  Medication changes, Labs and Tests ordered today are listed in the Patient Instructions below. Patient Instructions  Medication Instructions:  No changes If you need a refill on your cardiac medications before your next appointment, please call your pharmacy.   Lab work: none If you have labs (blood work) drawn today and your tests are completely normal, you will receive your results only by: Marland Kitchen MyChart Message (if you have MyChart) OR . A paper copy in the mail If you have any lab test that is abnormal or we need to change your treatment, we will call you to review the results.  Testing/Procedures: none  Follow-Up: At [facility], you and your health needs are our priority.  As part of our continuing mission to provide you with exceptional heart care, we have created designated Provider Care Teams.  These Care Teams include your primary Cardiologist (physician) and Advanced Practice Providers (APPs -  Physician Assistants and Nurse Practitioners) who all work together to provide you with the care you need, when you need it. You will need a follow up appointment in 12 months.  Please call our office 2 months in advance to schedule this appointment.  You may see Keith Furbish, MD or one of the following Advanced Practice Providers on your designated Care Team:   Truitt Merle, NP Cecilie Kicks, NP . Kathyrn Drown, NP  Any Other Special Instructions Will Be Listed Below (If Applicable).       Signed, Keith Furbish, MD  12/01/2018 3:17 PM    [facility] [place], [place], [place]  [address]  Phone: [phone]; Fax: [phone]

## 2018-12-01 NOTE — Patient Instructions (Signed)
Medication Instructions:  No changes If you need a refill on your cardiac medications before your next appointment, please call your pharmacy.   Lab work: none If you have labs (blood work) drawn today and your tests are completely normal, you will receive your results only by: . MyChart Message (if you have MyChart) OR . A paper copy in the mail If you have any lab test that is abnormal or we need to change your treatment, we will call you to review the results.  Testing/Procedures: none  Follow-Up: At [facility], you and your health needs are our priority.  As part of our continuing mission to provide you with exceptional heart care, we have created designated Provider Care Teams.  These Care Teams include your primary Cardiologist (physician) and Advanced Practice Providers (APPs -  Physician Assistants and Nurse Practitioners) who all work together to provide you with the care you need, when you need it. You will need a follow up appointment in 12 months.  Please call our office 2 months in advance to schedule this appointment.  You may see Mark Skains, MD or one of the following Advanced Practice Providers on your designated Care Team:   Lori Gerhardt, NP Laura Ingold, NP . Jill McDaniel, NP  Any Other Special Instructions Will Be Listed Below (If Applicable).    

## 2018-12-02 ENCOUNTER — Encounter: Payer: Self-pay | Admitting: Family Medicine

## 2018-12-18 ENCOUNTER — Encounter: Payer: Self-pay | Admitting: Family Medicine

## 2018-12-20 ENCOUNTER — Other Ambulatory Visit: Payer: Self-pay | Admitting: Family Medicine

## 2018-12-20 NOTE — Telephone Encounter (Signed)
Electronic refill request. Tramadol Last office visit:   06/10/2018 Last Filled:    180 tablet 1 10/19/2018  Please advise.

## 2018-12-21 ENCOUNTER — Encounter: Payer: Self-pay | Admitting: Family Medicine

## 2018-12-21 NOTE — Telephone Encounter (Signed)
Sent. Thanks.   

## 2018-12-22 ENCOUNTER — Encounter: Payer: Self-pay | Admitting: Family Medicine

## 2018-12-23 NOTE — Telephone Encounter (Signed)
Pharmacy preference has already been made.

## 2018-12-24 ENCOUNTER — Encounter: Payer: Self-pay | Admitting: Family Medicine

## 2018-12-24 ENCOUNTER — Other Ambulatory Visit: Payer: Self-pay | Admitting: Family Medicine

## 2018-12-24 MED ORDER — TRAMADOL HCL 50 MG PO TABS: 50.0000 mg | ORAL_TABLET | Freq: Three times a day (TID) | ORAL | 1 refills | Status: DC | PRN

## 2019-01-04 ENCOUNTER — Other Ambulatory Visit: Payer: Self-pay | Admitting: Family Medicine

## 2019-01-04 ENCOUNTER — Other Ambulatory Visit (INDEPENDENT_AMBULATORY_CARE_PROVIDER_SITE_OTHER): Payer: Medicare Other

## 2019-01-04 ENCOUNTER — Ambulatory Visit (INDEPENDENT_AMBULATORY_CARE_PROVIDER_SITE_OTHER): Payer: Medicare Other

## 2019-01-04 ENCOUNTER — Ambulatory Visit: Payer: Self-pay

## 2019-01-04 DIAGNOSIS — Z0000 Encounter for general adult medical examination without abnormal findings: Secondary | ICD-10-CM | POA: Diagnosis not present

## 2019-01-04 DIAGNOSIS — I10 Essential (primary) hypertension: Secondary | ICD-10-CM

## 2019-01-04 DIAGNOSIS — Z125 Encounter for screening for malignant neoplasm of prostate: Secondary | ICD-10-CM | POA: Diagnosis not present

## 2019-01-04 LAB — COMPREHENSIVE METABOLIC PANEL
ALT: 12 U/L (ref 0–53)
AST: 18 U/L (ref 0–37)
Albumin: 4.3 g/dL (ref 3.5–5.2)
Alkaline Phosphatase: 74 U/L (ref 39–117)
BUN: 7 mg/dL (ref 6–23)
CO2: 27 mEq/L (ref 19–32)
Calcium: 9 mg/dL (ref 8.4–10.5)
Chloride: 102 mEq/L (ref 96–112)
Creatinine, Ser: 0.83 mg/dL (ref 0.40–1.50)
GFR: 90.68 mL/min (ref >60.00)
Glucose, Bld: 93 mg/dL (ref 70–99)
Potassium: 4.2 mEq/L (ref 3.5–5.1)
Sodium: 137 mEq/L (ref 135–145)
Total Bilirubin: 0.5 mg/dL (ref 0.2–1.2)
Total Protein: 6.8 g/dL (ref 6.0–8.3)

## 2019-01-04 LAB — LIPID PANEL
Cholesterol: 111 mg/dL (ref 0–200)
HDL: 50 mg/dL (ref >39.00)
LDL Cholesterol: 45 mg/dL (ref 0–99)
NonHDL: 61.3
Total CHOL/HDL Ratio: 2
Triglycerides: 84 mg/dL (ref 0.0–149.0)
VLDL: 16.8 mg/dL (ref 0.0–40.0)

## 2019-01-04 LAB — CBC WITH DIFFERENTIAL/PLATELET
Basophils Absolute: 0.1 10*3/uL (ref 0.0–0.1)
Basophils Relative: 1.8 % (ref 0.0–3.0)
Eosinophils Absolute: 0.1 10*3/uL (ref 0.0–0.7)
Eosinophils Relative: 1.9 % (ref 0.0–5.0)
HCT: 38 % — ABNORMAL LOW (ref 39.0–52.0)
Hemoglobin: 12.3 g/dL — ABNORMAL LOW (ref 13.0–17.0)
Lymphocytes Relative: 39.9 % (ref 12.0–46.0)
Lymphs Abs: 2.3 10*3/uL (ref 0.7–4.0)
MCHC: 32.4 g/dL (ref 30.0–36.0)
MCV: 84.1 fl (ref 78.0–100.0)
Monocytes Absolute: 0.5 10*3/uL (ref 0.1–1.0)
Monocytes Relative: 7.8 % (ref 3.0–12.0)
Neutro Abs: 2.8 10*3/uL (ref 1.4–7.7)
Neutrophils Relative %: 48.6 % (ref 43.0–77.0)
Platelets: 325 10*3/uL (ref 150.0–400.0)
RBC: 4.52 Mil/uL (ref 4.22–5.81)
RDW: 15.2 % (ref 11.5–15.5)
WBC: 5.9 10*3/uL (ref 4.0–10.5)

## 2019-01-04 LAB — PSA, MEDICARE: PSA: 6.67 ng/ml — ABNORMAL HIGH (ref 0.10–4.00)

## 2019-01-04 NOTE — Patient Instructions (Signed)
[name] , Thank you for taking time to come for your Medicare Wellness Visit. I appreciate your ongoing commitment to your health goals. Please review the following plan we discussed and let me know if I can assist you in the future.   Screening recommendations/referrals: Colonoscopy: up to date, completed 08/20/2016 Recommended yearly ophthalmology/optometry visit for glaucoma screening and checkup Recommended yearly dental visit for hygiene and checkup  Vaccinations: Influenza vaccine: will get at office visit Pneumococcal vaccine: Completed series Tdap vaccine: up to date, completed 12/18/2016 Shingles vaccine: will get at office visit if available    Advanced directives: Please bring a copy of your POA (Power of [place]) and/or Living Will to your next appointment.   Conditions/risks identified: hypertension, hyperlipidemia  Next appointment: 01/11/2019 @ 2:15 pm   Preventive Care 65 Years and Older, Male Preventive care refers to lifestyle choices and visits with your health care provider that can promote health and wellness. What does preventive care include?  A yearly physical exam. This is also called an annual well check.  Dental exams once or twice a year.  Routine eye exams. Ask your health care provider how often you should have your eyes checked.  Personal lifestyle choices, including:  Daily care of your teeth and gums.  Regular physical activity.  Eating a healthy diet.  Avoiding tobacco and drug use.  Limiting alcohol use.  Practicing safe sex.  Taking low doses of aspirin every day.  Taking vitamin and mineral supplements as recommended by your health care provider. What happens during an annual well check? The services and screenings done by your health care provider during your annual well check will depend on your age, overall health, lifestyle risk factors, and family history of disease. Counseling  Your health care provider may ask you  questions about your:  Alcohol use.  Tobacco use.  Drug use.  Emotional well-being.  Home and relationship well-being.  Sexual activity.  Eating habits.  History of falls.  Memory and ability to understand (cognition).  Work and work Statistician. Screening  You may have the following tests or measurements:  Height, weight, and BMI.  Blood pressure.  Lipid and cholesterol levels. These may be checked every 5 years, or more frequently if you are over 67 years old.  Skin check.  Lung cancer screening. You may have this screening every year starting at age 50 if you have a 30-pack-year history of smoking and currently smoke or have quit within the past 15 years.  Fecal occult blood test (FOBT) of the stool. You may have this test every year starting at age 55.  Flexible sigmoidoscopy or colonoscopy. You may have a sigmoidoscopy every 5 years or a colonoscopy every 10 years starting at age 74.  Prostate cancer screening. Recommendations will vary depending on your family history and other risks.  Hepatitis C blood test.  Hepatitis B blood test.  Sexually transmitted disease (STD) testing.  Diabetes screening. This is done by checking your blood sugar (glucose) after you have not eaten for a while (fasting). You may have this done every 1-3 years.  Abdominal aortic aneurysm (AAA) screening. You may need this if you are a current or former smoker.  Osteoporosis. You may be screened starting at age 78 if you are at high risk. Talk with your health care provider about your test results, treatment options, and if necessary, the need for more tests. Vaccines  Your health care provider may recommend certain vaccines, such as:  Influenza vaccine.  This is recommended every year.  Tetanus, diphtheria, and acellular pertussis (Tdap, Td) vaccine. You may need a Td booster every 10 years.  Zoster vaccine. You may need this after age 33.  Pneumococcal 13-valent conjugate  (PCV13) vaccine. One dose is recommended after age 35.  Pneumococcal polysaccharide (PPSV23) vaccine. One dose is recommended after age 33. Talk to your health care provider about which screenings and vaccines you need and how often you need them. This information is not intended to replace advice given to you by your health care provider. Make sure you discuss any questions you have with your health care provider. Document Released: 03/16/2015 Document Revised: 11/07/2015 Document Reviewed: 12/19/2014 Elsevier Interactive Patient Education  2017 [employer] Prevention in the Home Falls can cause injuries. They can happen to people of all ages. There are many things you can do to make your home safe and to help prevent falls. What can I do on the outside of my home?  Regularly fix the edges of walkways and driveways and fix any cracks.  Remove anything that might make you trip as you walk through a door, such as a raised step or threshold.  Trim any bushes or trees on the path to your home.  Use bright outdoor lighting.  Clear any walking paths of anything that might make someone trip, such as rocks or tools.  Regularly check to see if handrails are loose or broken. Make sure that both sides of any steps have handrails.  Any raised decks and porches should have guardrails on the edges.  Have any leaves, snow, or ice cleared regularly.  Use sand or salt on walking paths during winter.  Clean up any spills in your garage right away. This includes oil or grease spills. What can I do in the bathroom?  Use night lights.  Install grab bars by the toilet and in the tub and shower. Do not use towel bars as grab bars.  Use non-skid mats or decals in the tub or shower.  If you need to sit down in the shower, use a plastic, non-slip stool.  Keep the floor dry. Clean up any water that spills on the floor as soon as it happens.  Remove soap buildup in the tub or shower  regularly.  Attach bath mats securely with double-sided non-slip rug tape.  Do not have throw rugs and other things on the floor that can make you trip. What can I do in the bedroom?  Use night lights.  Make sure that you have a light by your bed that is easy to reach.  Do not use any sheets or blankets that are too big for your bed. They should not hang down onto the floor.  Have a firm chair that has side arms. You can use this for support while you get dressed.  Do not have throw rugs and other things on the floor that can make you trip. What can I do in the kitchen?  Clean up any spills right away.  Avoid walking on wet floors.  Keep items that you use a lot in easy-to-reach places.  If you need to reach something above you, use a strong step stool that has a grab bar.  Keep electrical cords out of the way.  Do not use floor polish or wax that makes floors slippery. If you must use wax, use non-skid floor wax.  Do not have throw rugs and other things on the floor that can make  you trip. What can I do with my stairs?  Do not leave any items on the stairs.  Make sure that there are handrails on both sides of the stairs and use them. Fix handrails that are broken or loose. Make sure that handrails are as long as the stairways.  Check any carpeting to make sure that it is firmly attached to the stairs. Fix any carpet that is loose or worn.  Avoid having throw rugs at the top or bottom of the stairs. If you do have throw rugs, attach them to the floor with carpet tape.  Make sure that you have a light switch at the top of the stairs and the bottom of the stairs. If you do not have them, ask someone to add them for you. What else can I do to help prevent falls?  Wear shoes that:  Do not have high heels.  Have rubber bottoms.  Are comfortable and fit you well.  Are closed at the toe. Do not wear sandals.  If you use a stepladder:  Make sure that it is fully  opened. Do not climb a closed stepladder.  Make sure that both sides of the stepladder are locked into place.  Ask someone to hold it for you, if possible.  Clearly mark and make sure that you can see:  Any grab bars or handrails.  First and last steps.  Where the edge of each step is.  Use tools that help you move around (mobility aids) if they are needed. These include:  Canes.  Walkers.  Scooters.  Crutches.  Turn on the lights when you go into a dark area. Replace any light bulbs as soon as they burn out.  Set up your furniture so you have a clear path. Avoid moving your furniture around.  If any of your floors are uneven, fix them.  If there are any pets around you, be aware of where they are.  Review your medicines with your doctor. Some medicines can make you feel dizzy. This can increase your chance of falling. Ask your doctor what other things that you can do to help prevent falls. This information is not intended to replace advice given to you by your health care provider. Make sure you discuss any questions you have with your health care provider. Document Released: 12/14/2008 Document Revised: 07/26/2015 Document Reviewed: 03/24/2014 Elsevier Interactive Patient Education  2017 [employer].

## 2019-01-04 NOTE — Progress Notes (Signed)
PCP notes:  Health Maintenance: Wants flu vaccine and Shingrix if available   Abnormal Screenings:  none  Patient concerns:  Patient wants to discuss ringing in his left ear and family history of memory loss.   Nurse concerns:  none  Next PCP appt.: 01/11/2019 @ 2:15 pm

## 2019-01-04 NOTE — Progress Notes (Signed)
Subjective:   [name]. is a 73 y.o. male who presents for Medicare Annual/Subsequent preventive examination.  Review of Systems: N/A   This visit is being conducted through telemedicine via telephone at the nurse health advisor's home address due to the COVID-19 pandemic. This patient has given me verbal consent via doximity to conduct this visit, patient states they are participating from their home address. Patient and myself are on the telephone call. There is no referral for this visit. Some vital signs may be absent or patient reported.    Patient identification: identified by name, DOB, and current address   Cardiac Risk Factors include: advanced age (>69men, >64 women);dyslipidemia;hypertension;male gender     Objective:    Vitals: There were no vitals taken for this visit.  There is no height or weight on file to calculate BMI.  Advanced Directives 01/04/2019 03/18/2018 03/15/2018 12/10/2016 06/01/2016 05/30/2016 08/08/2015  Does Patient Have a Medical Advance Directive? Yes Yes Yes Yes No No Yes  Type of Paramedic of [place];Living will [place];Living will [place];Living will [place];Living will - - [place];Living will  Does patient want to make changes to medical advance directive? - No - Patient declined No - Patient declined - - - -  Copy of [place] in Chart? No - copy requested No - copy requested No - copy requested No - copy requested - - Yes  Would patient like information on creating a medical advance directive? - - - - - No - Patient declined -    Tobacco Social History   Tobacco Use  Smoking Status Former Smoker  . Packs/day: 1.00  . Types: Cigarettes  . Quit date: 04/12/1984  . Years since quitting: 34.7  Smokeless Tobacco Never Used     Counseling given: Not Answered   Clinical Intake:  Pre-visit preparation completed: Yes   Pain : No/denies pain     Nutritional Risks: None Diabetes: No  How often do you need to have someone help you when you read instructions, pamphlets, or other written materials from your doctor or pharmacy?: 1 - Never What is the last grade level you completed in school?: bachelors  Interpreter Needed?: No  Information entered by :: CJohnson, LPN  Past Medical History:  Diagnosis Date  . Adjustment reaction with physical symptoms   . Anxiety   . Cancer, skin, squamous cell    R shoulder, removed per derm 2012 and chest  . Coronary artery disease    a. 05/2015 c/p post-op surgery; b. 07/2015 MV EF 53%, basal inferoseptal, mid inferoseptal, and apical inferior mild ischemia;  c. 08/2015 Cath/PCI: LM nl, LAD 52m (3.5x20 Promus DES), 69m, LCX nl, RCA nl, EF 60%.  . Diverticulosis 2018   Noted on colonoscopy  . Ear ringing    Left  . Elevated PSA    h/o with normal bx per URO  . GERD (gastroesophageal reflux disease)   . Hearing loss    Slight left ear  . History of BPH   . History of kidney stones   . Hydronephrosis    Left, urteral obstruction, 2-3 cm UVJ  . Hyperlipidemia   . Hypertension   . Nephrolithiasis   . OA (osteoarthritis)    with r hip replacement  . Osteoarthrosis involving, or with mention of more than one site, but not specified as generalized, site unspecified(715.80)   . Restless legs syndrome (RLS)   .  Ruptured lumbar disc   . Syncope    negative cardiac w/u per Dr Irish Lack  . Tick bite 2011   likely tick associated illness, treated with doxy with resolution,  . Viral exanthem, unspecified    Past Surgical History:  Procedure Laterality Date  . ACHILLES TENDON REPAIR Left 1984  . CARDIAC CATHETERIZATION N/A 08/08/2015   Procedure: Left Heart Cath and Coronary Angiography;  Surgeon: Jerline Pain, MD;  Location: [facility];  Service: Cardiovascular;  Laterality: N/A;  . CARDIAC CATHETERIZATION N/A 08/08/2015   Procedure: Coronary Stent  Intervention;  Surgeon: Jettie Booze, MD;  Location: [facility];  Service: Cardiovascular;  Laterality: N/A;  . COLONOSCOPY  2018  . CORONARY STENT PLACEMENT  08/08/2015   MID LAD WITH DES  . JOINT REPLACEMENT    . SHOULDER ARTHROSCOPY  2012   R rotator cuff (biceps tear not repaired)  . SKIN CANCER EXCISION     right shoulder and mid chest  . TOTAL HIP ARTHROPLASTY     right  . TOTAL HIP ARTHROPLASTY Left 05/02/2015   Procedure: TOTAL LEFT HIP ARTHROPLASTY ANTERIOR APPROACH;  Surgeon: Gaynelle Arabian, MD;  Location: [facility];  Service: Orthopedics;  Laterality: Left;  . TOTAL SHOULDER ARTHROPLASTY Left 03/18/2018   Procedure: TOTAL SHOULDER ARTHROPLASTY;  Surgeon: Justice Britain, MD;  Location: [facility];  Service: Orthopedics;  Laterality: Left;  148min   Family History  Problem Relation Age of Onset  . Alzheimer's disease Father   . Arrhythmia Mother   . Prostate cancer Maternal Uncle   . Colon cancer Neg Hx   . Heart attack Neg Hx   . Hypertension Neg Hx    Social History   Socioeconomic History  . Marital status: Married    Spouse name: Not on file  . Number of children: Not on file  . Years of education: Not on file  . Highest education level: Not on file  Occupational History  . Occupation: Lobbyist: [employer]  Social Needs  . Financial resource strain: Not hard at all  . Food insecurity    Worry: Never true    Inability: Never true  . Transportation needs    Medical: No    Non-medical: No  Tobacco Use  . Smoking status: Former Smoker    Packs/day: 1.00    Types: Cigarettes    Quit date: 04/12/1984    Years since quitting: 34.7  . Smokeless tobacco: Never Used  Substance and Sexual Activity  . Alcohol use: No    Alcohol/week: 0.0 standard drinks  . Drug use: No  . Sexual activity: Yes  Lifestyle  . Physical activity    Days per week: 2 days    Minutes per session: 30 min  . Stress: Only a little  Relationships  .  Social Herbalist on phone: Not on file    Gets together: Not on file    Attends religious service: Not on file    Active member of club or organization: Not on file    Attends meetings of clubs or organizations: Not on file    Relationship status: Not on file  Other Topics Concern  . 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     Retired.     His youngest daughter is dead    Outpatient Encounter Medications as of 01/04/2019  Medication Sig  . Alcaftadine 0.25 % SOLN Place 1 drop into both eyes daily as needed (LASTACAFT - itchy eyes).   Marland Kitchen amLODipine (NORVASC) 10 MG tablet TAKE 1 TABLET BY MOUTH  DAILY  . clopidogrel (PLAVIX) 75 MG tablet TAKE 1 TABLET BY MOUTH  DAILY WITH BREAKFAST  . cyclobenzaprine (FLEXERIL) 10 MG tablet Take 1 tablet (10 mg total) by mouth 3 (three) times daily as needed for muscle spasms.  . diazepam (VALIUM) 5 MG tablet TAKE 1 TO 2 TABLETS BY  MOUTH EVERY 12 HOURS AS  NEEDED FOR ANXIETY AND FOR  INSOMNIA  . hydrocortisone cream 1 % Apply 1 application topically daily as needed for itching.  . Liniments (SALONPAS PAIN RELIEF PATCH EX) Apply 1 patch topically daily as needed (pain).  . nitroGLYCERIN (NITROSTAT) 0.4 MG SL tablet Place 1 tablet (0.4 mg total) under the tongue every 5 (five) minutes as needed for chest pain.  Marland Kitchen ondansetron (ZOFRAN) 4 MG tablet Take 1 tablet (4 mg total) by mouth every 8 (eight) hours as needed for nausea or vomiting.  Marland Kitchen OVER THE COUNTER MEDICATION Apply 1 application topically daily as needed (pain - PENETREX Cream).  . pantoprazole (PROTONIX) 40 MG tablet TAKE 1 TABLET BY MOUTH  DAILY  . REPATHA SURECLICK [id number] MG/ML SOAJ INJECT 1 PEN UNDER THE SKIN EVERY 14 DAYS  . rOPINIRole  (REQUIP) 0.5 MG tablet TAKE 1 TABLET BY MOUTH AT  BEDTIME  . SIMPLY SALINE NA Place 1 spray into both nostrils 2 (two) times daily.   . traMADol (ULTRAM) 50 MG tablet Take 1-2 tablets (50-100 mg total) by mouth every 8 (eight) hours as needed (pain).   No facility-administered encounter medications on file as of 01/04/2019.     Activities of Daily Living In your present state of health, do you have any difficulty performing the following activities: 01/04/2019 03/18/2018  Hearing? Y N  Comment ringing in left ear -  Vision? N N  Difficulty concentrating or making decisions? N N  Walking or climbing stairs? N N  Dressing or bathing? N Y  Comment - L TSA  Doing errands, shopping? N N  Preparing Food and eating ? N -  Using the Toilet? N -  In the past six months, have you accidently leaked urine? Y -  Comment slight leakage -  Do you have problems with loss of bowel control? N -  Managing your Medications? N -  Managing your Finances? N -  Housekeeping or managing your Housekeeping? N -  Some recent data might be hidden    Patient Care Team: Tonia Ghent, MD as PCP - General Jerline Pain, MD as PCP - Cardiology (Cardiology) Melissa Noon, [place] as Referring Physician (Optometry)   Assessment:   This is a routine wellness examination for Yoel.  Exercise Activities and Dietary recommendations Current Exercise Habits: Home exercise routine, Type of exercise: Other - see comments(biking), Time (Minutes): 30, Frequency (Times/Week): 2, Weekly Exercise (Minutes/Week): 60, Intensity: Mild, Exercise limited by: None identified  Goals    . Increase water intake     Starting 12/10/2016, I will continue to drink at least 6-8 glasses of water daily.     . Patient Stated     01/04/2019, I will maintain and continue  medications as prescribed.        Fall Risk Fall Risk  01/04/2019 12/24/2017 12/10/2016 12/07/2015 11/16/2014  Falls in the past year? 0 Yes No No No  Number falls in past  yr: 0 1 - - -  Injury with Fall? 0 - - - -  Risk for fall due to : Medication side effect - - - -  Follow up Falls evaluation completed;Falls prevention discussed - - - -   Is the patient's home free of loose throw rugs in walkways, pet beds, electrical cords, etc?   yes      Grab bars in the bathroom? yes      Handrails on the stairs?   yes      Adequate lighting?   yes  Timed Get Up and Go Performed: N/A  Depression Screen PHQ 2/9 Scores 01/04/2019 12/24/2017 12/10/2016 12/07/2015  PHQ - 2 Score 0 0 0 6  PHQ- 9 Score 0 - 0 -    Cognitive Function MMSE - Mini Mental State Exam 01/04/2019 12/10/2016  Orientation to time 5 5  Orientation to Place 5 5  Registration 3 3  Attention/ Calculation 5 0  Recall 3 3  Language- name 2 objects - 0  Language- repeat 1 1  Language- follow 3 step command - 3  Language- read & follow direction - 0  Write a sentence - 0  Copy design - 0  Total score - 20  Mini Cog  Mini-Cog screen was completed. Maximum score is 22. A value of 0 denotes this part of the MMSE was not completed or the patient failed this part of the Mini-Cog screening.       Immunization History  Administered Date(s) Administered  . Hep A / Hep B 03/29/2008, 06/07/2008, 11/27/2008  . Influenza Split 12/31/2011, 01/16/2015  . Influenza Whole 11/23/2007, 01/10/2010  . Influenza,inj,Quad PF,6+ Mos 01/12/2016, 01/03/2017, 01/02/2018  . Influenza,inj,quad, With Preservative 01/01/2017  . Influenza-Unspecified 12/01/2012, 12/15/2013, 01/12/2016  . Pneumococcal Conjugate-13 11/16/2014  . Pneumococcal Polysaccharide-23 12/25/2006, 10/29/2012  . Td 11/20/2006  . Tdap 12/18/2016  . Typhoid Parenteral 03/29/2008  . Zoster 03/18/2007    Qualifies for Shingles Vaccine? Yes  Screening Tests Health Maintenance  Topic Date Due  . INFLUENZA VACCINE  10/02/2018  . COLONOSCOPY  08/21/2026  . TETANUS/TDAP  01/02/2027  . Hepatitis C Screening  Completed  . PNA vac Low Risk Adult   Completed   Cancer Screenings: Lung: Low Dose CT Chest recommended if Age 36-80 years, 30 pack-year currently smoking OR have quit w/in 15years. Patient does not qualify. Colorectal: completed 08/20/2016  Additional Screenings:  Hepatitis C Screening: 12/03/2015      Plan:   Patient will maintain and continue medications as prescribed.   I have personally reviewed and noted the following in the patient's chart:   . Medical and social history . Use of alcohol, tobacco or illicit drugs  . Current medications and supplements . Functional ability and status . Nutritional status . Physical activity . Advanced directives . List of other physicians . Hospitalizations, surgeries, and ER visits in previous 12 months . Vitals . Screenings to include cognitive, depression, and falls . Referrals and appointments  In addition, I have reviewed and discussed with patient certain preventive protocols, quality metrics, and best practice recommendations. A written personalized care plan for preventive services as well as general preventive health recommendations were provided to patient.     [place], [place], LPN  [id number]

## 2019-01-05 ENCOUNTER — Other Ambulatory Visit: Payer: Self-pay | Admitting: Family Medicine

## 2019-01-11 ENCOUNTER — Ambulatory Visit (INDEPENDENT_AMBULATORY_CARE_PROVIDER_SITE_OTHER): Payer: Medicare Other | Admitting: Family Medicine

## 2019-01-11 ENCOUNTER — Encounter: Payer: Self-pay | Admitting: Family Medicine

## 2019-01-11 ENCOUNTER — Other Ambulatory Visit: Payer: Self-pay

## 2019-01-11 VITALS — BP 122/78 | HR 83 | Temp 97.8°F | Ht 67.5 in | Wt 164.3 lb

## 2019-01-11 DIAGNOSIS — F419 Anxiety disorder, unspecified: Secondary | ICD-10-CM

## 2019-01-11 DIAGNOSIS — K219 Gastro-esophageal reflux disease without esophagitis: Secondary | ICD-10-CM

## 2019-01-11 DIAGNOSIS — G2581 Restless legs syndrome: Secondary | ICD-10-CM | POA: Diagnosis not present

## 2019-01-11 DIAGNOSIS — Z23 Encounter for immunization: Secondary | ICD-10-CM

## 2019-01-11 DIAGNOSIS — I10 Essential (primary) hypertension: Secondary | ICD-10-CM

## 2019-01-11 DIAGNOSIS — E785 Hyperlipidemia, unspecified: Secondary | ICD-10-CM

## 2019-01-11 DIAGNOSIS — M158 Other polyosteoarthritis: Secondary | ICD-10-CM

## 2019-01-11 DIAGNOSIS — Z7189 Other specified counseling: Secondary | ICD-10-CM

## 2019-01-11 DIAGNOSIS — Z0000 Encounter for general adult medical examination without abnormal findings: Secondary | ICD-10-CM

## 2019-01-11 MED ORDER — ROPINIROLE HCL 0.5 MG PO TABS: 0.5000 mg | ORAL_TABLET | Freq: Every day | ORAL | 3 refills | Status: DC

## 2019-01-11 MED ORDER — PANTOPRAZOLE SODIUM 40 MG PO TBEC: 40.0000 mg | DELAYED_RELEASE_TABLET | Freq: Every day | ORAL | 3 refills | Status: DC

## 2019-01-11 MED ORDER — CLOPIDOGREL BISULFATE 75 MG PO TABS: 75.0000 mg | ORAL_TABLET | Freq: Every day | ORAL | 3 refills | Status: DC

## 2019-01-11 NOTE — Progress Notes (Signed)
Hypertension:    Using medication without problems or lightheadedness: yes Chest pain with exertion:no Edema:no Short of breath:no Labs d/w pt. No NTG use.   Elevated Cholesterol: Using medications without problems: yes Muscle aches: no Diet compliance: yes Exercise:yes Labs d/w pt.    GERD controlled with PPI, no ADE on med.   Mood d/w pt. He had been cutting diazepam in half, using ~1/2 tab qhs prn, but not every night.  He slept better with that.  No SI/HI.  He wants to have contact with his grandkids but his wife's daughter has prevented that.  He has gone to counseling prev.    RLS.  Improved with requip. No ADE on med.  Compliant.    Joint pain tolerable with tramadol w/o ADE.  Not sedated. Taking robaxin as needed for muscle aches.    Flu shot today.  Tetanus 2018 PNA UTD Shingles d/w pt.   Colonoscopy 2018 Elevated PSA with neg bx prev per uro.  D/w pt about PSA elevation.  He has normal stream.  No burning with urination.  He had seen Dr. Diona Fanti with urology.   Living will d/w pt. Wife designated if patient were incapacitated No AAA on prev CT.   He had dermatology f/u recently.    PMH and SH reviewed  ROS: Per HPI unless specifically indicated in ROS section   Meds, vitals, and allergies reviewed.   GEN: nad, alert and oriented HEENT: ncat NECK: supple w/o LA CV: rrr.  PULM: ctab, no inc wob ABD: soft, +bs EXT: no edema SKIN: no acute rash

## 2019-01-11 NOTE — Patient Instructions (Addendum)
Check with your insurance to see if they will cover the shingrix shot. Let me update urology about your PSA. We may need to have you see urology vs recheck in 6 months.   Take care.  Glad to see you.

## 2019-01-12 ENCOUNTER — Telehealth: Payer: Self-pay | Admitting: Family Medicine

## 2019-01-12 DIAGNOSIS — Z125 Encounter for screening for malignant neoplasm of prostate: Secondary | ICD-10-CM

## 2019-01-12 NOTE — Assessment & Plan Note (Signed)
Improved with requip. No ADE on med.  Compliant.

## 2019-01-12 NOTE — Assessment & Plan Note (Signed)
Reasonable control.  No change in meds.  Continue work on diet and exercise. 

## 2019-01-12 NOTE — Assessment & Plan Note (Signed)
Living will d/w pt.  Wife designated if patient were incapacitated.   ?

## 2019-01-12 NOTE — Assessment & Plan Note (Signed)
Joint pain tolerable with tramadol w/o ADE.  Not sedated. Taking robaxin as needed for muscle aches.   Continue as is.  He agrees.

## 2019-01-12 NOTE — Assessment & Plan Note (Signed)
Flu shot today.  Tetanus 2018 PNA UTD Shingles d/w pt.   Colonoscopy 2018 Elevated PSA with neg bx prev per uro.  D/w pt about PSA elevation.  He has normal stream.  No burning with urination.  He had seen Dr. Diona Fanti with urology.   Living will d/w pt. Wife designated if patient were incapacitated No AAA on prev CT.

## 2019-01-12 NOTE — Assessment & Plan Note (Signed)
Reasonable control.  No change in meds.  Continue work on diet and exercise.  I will update cardiology about his lipids.

## 2019-01-12 NOTE — Assessment & Plan Note (Signed)
Related to family stressors  Mood d/w pt. He had been cutting diazepam in half, using ~1/2 tab qhs prn, but not every night.  He slept better with that.  No SI/HI.  He wants to have contact with his grandkids but his wife's daughter has prevented that.  He has gone to counseling prev.  He will continue as is and update me as needed.

## 2019-01-12 NOTE — Assessment & Plan Note (Signed)
GERD controlled with PPI, no ADE on med.

## 2019-01-12 NOTE — Telephone Encounter (Signed)
Please contact Dr. Diona Fanti with [facility].  This patient has a history of an elevated PSA with neg bx. his PSA is slightly elevated compared to previous.  His stream is normal and he is feeling well otherwise.  I did know if Dr. Diona Fanti wanted me to recheck his PSA in 6 months here or if he wanted me to refer him back to urology.  I appreciate his input.  Thanks.

## 2019-01-13 NOTE — Telephone Encounter (Signed)
Patient has not seen Dr. Diona Fanti since May 2017 so that call would be left up to Dr. [name] or the patient.

## 2019-01-14 NOTE — Telephone Encounter (Signed)
Patient prefers to follow up with Dr. [name] in 6 months.  Lab and OV appts scheduled.

## 2019-01-14 NOTE — Addendum Note (Signed)
Addended by: Tonia Ghent on: 01/14/2019 09:17 AM   Modules accepted: Orders

## 2019-01-14 NOTE — Telephone Encounter (Signed)
Please call pt.  I think either would be okay, either recheck here in 6 months or f/u with urology now.  I can re-refer if needed.  I went ahead and put in the order for the f/u PSA, just to have the order in the EMR.    If patient is okay with recheck here in 6 months, then I support that.  If his recheck is back to ~his prev level, then we can just observe.  If still elevated, then we can refer to urology at that point.  Thanks.

## 2019-01-18 ENCOUNTER — Encounter: Payer: Self-pay | Admitting: Family Medicine

## 2019-01-20 ENCOUNTER — Encounter: Payer: Self-pay | Admitting: Family Medicine

## 2019-02-11 ENCOUNTER — Encounter: Payer: Self-pay | Admitting: Family Medicine

## 2019-02-17 ENCOUNTER — Encounter: Payer: Self-pay | Admitting: Family Medicine

## 2019-02-18 ENCOUNTER — Other Ambulatory Visit: Payer: Self-pay | Admitting: Family Medicine

## 2019-02-18 MED ORDER — TRAMADOL HCL 50 MG PO TABS: 50.0000 mg | ORAL_TABLET | Freq: Three times a day (TID) | ORAL | 1 refills | Status: DC | PRN

## 2019-02-19 ENCOUNTER — Encounter: Payer: Self-pay | Admitting: Family Medicine

## 2019-02-21 ENCOUNTER — Encounter: Payer: Self-pay | Admitting: Family Medicine

## 2019-03-18 ENCOUNTER — Encounter: Payer: Self-pay | Admitting: Family Medicine

## 2019-03-19 ENCOUNTER — Encounter: Payer: Self-pay | Admitting: Family Medicine

## 2019-03-26 ENCOUNTER — Encounter: Payer: Self-pay | Admitting: Family Medicine

## 2019-03-31 ENCOUNTER — Encounter: Payer: Self-pay | Admitting: Family Medicine

## 2019-04-01 ENCOUNTER — Ambulatory Visit: Payer: Medicare Other

## 2019-04-08 ENCOUNTER — Ambulatory Visit: Payer: Medicare Other

## 2019-04-11 ENCOUNTER — Ambulatory Visit: Payer: Self-pay

## 2019-04-18 ENCOUNTER — Ambulatory Visit: Payer: Medicare Other

## 2019-04-23 ENCOUNTER — Encounter: Payer: Self-pay | Admitting: Family Medicine

## 2019-04-27 ENCOUNTER — Other Ambulatory Visit: Payer: Self-pay | Admitting: Family Medicine

## 2019-04-27 MED ORDER — TRAMADOL HCL 50 MG PO TABS: 50.0000 mg | ORAL_TABLET | Freq: Three times a day (TID) | ORAL | 1 refills | Status: DC | PRN

## 2019-06-03 ENCOUNTER — Telehealth: Payer: Self-pay

## 2019-06-03 NOTE — Telephone Encounter (Signed)
Called pt and left message informing pt that [facility] has been trying to call him to set up his medication refills and that they needed him to give them a call to be able to do this at [phone] and if he needed to give our office a call back, he could reached our office at [phone].

## 2019-06-17 ENCOUNTER — Encounter: Payer: Self-pay | Admitting: Family Medicine

## 2019-06-25 ENCOUNTER — Encounter: Payer: Self-pay | Admitting: Family Medicine

## 2019-06-27 MED ORDER — TRAMADOL HCL 50 MG PO TABS: 50.0000 mg | ORAL_TABLET | Freq: Three times a day (TID) | ORAL | 0 refills | Status: DC | PRN

## 2019-06-27 NOTE — Telephone Encounter (Signed)
Refilled once in pcp absence

## 2019-06-27 NOTE — Telephone Encounter (Signed)
Please review for Dr [name]. Last time filled on 04/27/19 #180 with 1 refill to [facility] LOV 01/11/2019 for CPE  Next appointment on 07/12/2019

## 2019-07-05 ENCOUNTER — Other Ambulatory Visit: Payer: Self-pay | Admitting: Family Medicine

## 2019-07-05 ENCOUNTER — Other Ambulatory Visit (INDEPENDENT_AMBULATORY_CARE_PROVIDER_SITE_OTHER): Payer: Medicare Other

## 2019-07-05 ENCOUNTER — Other Ambulatory Visit: Payer: Self-pay

## 2019-07-05 DIAGNOSIS — Z125 Encounter for screening for malignant neoplasm of prostate: Secondary | ICD-10-CM | POA: Diagnosis not present

## 2019-07-05 DIAGNOSIS — R972 Elevated prostate specific antigen [PSA]: Secondary | ICD-10-CM

## 2019-07-05 LAB — PSA, MEDICARE: PSA: 6.64 ng/ml — ABNORMAL HIGH (ref 0.10–4.00)

## 2019-07-09 ENCOUNTER — Encounter: Payer: Self-pay | Admitting: Family Medicine

## 2019-07-11 ENCOUNTER — Encounter: Payer: Self-pay | Admitting: *Deleted

## 2019-07-12 ENCOUNTER — Encounter: Payer: Self-pay | Admitting: Family Medicine

## 2019-07-12 ENCOUNTER — Other Ambulatory Visit: Payer: Self-pay

## 2019-07-12 ENCOUNTER — Ambulatory Visit (INDEPENDENT_AMBULATORY_CARE_PROVIDER_SITE_OTHER): Payer: Medicare Other | Admitting: Family Medicine

## 2019-07-12 DIAGNOSIS — R972 Elevated prostate specific antigen [PSA]: Secondary | ICD-10-CM

## 2019-07-12 DIAGNOSIS — E785 Hyperlipidemia, unspecified: Secondary | ICD-10-CM

## 2019-07-12 DIAGNOSIS — G720 Drug-induced myopathy: Secondary | ICD-10-CM

## 2019-07-12 DIAGNOSIS — G2581 Restless legs syndrome: Secondary | ICD-10-CM

## 2019-07-12 DIAGNOSIS — Z659 Problem related to unspecified psychosocial circumstances: Secondary | ICD-10-CM

## 2019-07-12 DIAGNOSIS — G4700 Insomnia, unspecified: Secondary | ICD-10-CM

## 2019-07-12 DIAGNOSIS — M158 Other polyosteoarthritis: Secondary | ICD-10-CM

## 2019-07-12 NOTE — Progress Notes (Signed)
This visit occurred during the SARS-CoV-2 public health emergency.  Safety protocols were in place, including screening questions prior to the visit, additional usage of staff PPE, and extensive cleaning of exam room while observing appropriate contact time as indicated for disinfecting solutions.  We talked about PSA results and seeing Dr. Diona Fanti.  He is schedule for June, which is reasonable.    He is on repatha per cards.  He has some fatigue after the shot but then that normalizes.  He is tolerating medication otherwise.  Rare use of diazepam.  Stressors d/w pt.  He had a lot of upheaval with his son after a conversation about covid.  He is still in contact with his daughter in law.  He tried using melatonin 6mg  at night.  Then RLS seemed to get worse, then he quit the melatonin and did better.  It seems like he was doing worse from a RLS standpoint when taking melatonin.  He still has joint pain at baseline in his neck and shoulders and knees and ankles.  He is using braces as needed for his knees and ankles with relief when he is working in the yard.  He has been using Salonpas patches and using tramadol without adverse effect.  Meds, vitals, and allergies reviewed.   ROS: Per HPI unless specifically indicated in ROS section   GEN: nad, alert and oriented HEENT: ncat NECK: supple w/o LA CV: rrr.  PULM: ctab, no inc wob ABD: soft, +bs EXT: no edema SKIN: no acute rash DRE deferred as he is going to follow-up with urology.

## 2019-07-12 NOTE — Patient Instructions (Signed)
I'll await the notes from urology.  Use the diazepam if needed and stay off melatonin.  Update me as needed.  Take care.  Glad to see you.

## 2019-07-19 DIAGNOSIS — Z659 Problem related to unspecified psychosocial circumstances: Secondary | ICD-10-CM | POA: Insufficient documentation

## 2019-07-19 NOTE — Assessment & Plan Note (Signed)
Rare use of diazepam.  He is trying to manage all the upheaval with his family.  See above.  Still safe at home and okay for outpatient follow-up.

## 2019-07-19 NOTE — Assessment & Plan Note (Signed)
He will follow-up with urology.  Discussed.  He agrees.

## 2019-07-19 NOTE — Assessment & Plan Note (Signed)
He still has joint pain at baseline in his neck and shoulders and knees and ankles.  He is using braces as needed for his knees and ankles with relief when he is working in the yard.  He has been using Salonpas patches and using tramadol without adverse effect.  Continue as is.

## 2019-07-19 NOTE — Assessment & Plan Note (Signed)
Would avoid melatonin in the future.  See above.  RLS symptoms seem to be worse with melatonin use.

## 2019-07-19 NOTE — Assessment & Plan Note (Signed)
Continue Repatha.

## 2019-07-26 ENCOUNTER — Encounter: Payer: Self-pay | Admitting: Family Medicine

## 2019-07-26 NOTE — Telephone Encounter (Signed)
Last refilled 06/27/19 #180 x 0 refills.   Please advise, thanks.

## 2019-07-27 MED ORDER — TRAMADOL HCL 50 MG PO TABS: 50.0000 mg | ORAL_TABLET | Freq: Three times a day (TID) | ORAL | 2 refills | Status: DC | PRN

## 2019-07-27 NOTE — Telephone Encounter (Signed)
Sent. Thanks.  I sent a note to patient.   

## 2019-08-20 ENCOUNTER — Encounter: Payer: Self-pay | Admitting: Family Medicine

## 2019-08-22 ENCOUNTER — Telehealth: Payer: Self-pay | Admitting: Family Medicine

## 2019-08-22 NOTE — Telephone Encounter (Signed)
See my chart message.  Please triage patient about fever, GI symptoms, skin lesion.  Thanks.

## 2019-08-22 NOTE — Telephone Encounter (Signed)
I spoke with pt; no fever today, pt had diarrhea from 08/17/19 - 08/19/19. Initially Bump size of end of finger on lower abd about 3 " below waistline. Now bump on lower abd is not noticeable just looking at abd; if runs finger over abd can feel tiny bump. No drainage noted.Pt is not having any abd pain or pressure. Pt had normal BM this AM, no blood or mucus.No N&V. Pt has no covid symptoms, no travel and no known exposure to + covid.  Offered pt appt but pt said he is feeling OK now and has appt to see Dr Beatrix Fetters today. Pt will cb for appt if feels necessary. UC & ED precautions given and pt voiced understanding. FYI to Dr [name].
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
## 2019-08-22 NOTE — Telephone Encounter (Signed)
Noted. Thanks.

## 2019-08-23 ENCOUNTER — Other Ambulatory Visit: Payer: Self-pay

## 2019-08-23 MED ORDER — NITROGLYCERIN 0.4 MG SL SUBL: 0.4000 mg | SUBLINGUAL_TABLET | SUBLINGUAL | 3 refills | Status: DC | PRN

## 2019-08-24 ENCOUNTER — Other Ambulatory Visit: Payer: Self-pay | Admitting: Urology

## 2019-08-24 ENCOUNTER — Other Ambulatory Visit (HOSPITAL_COMMUNITY): Payer: Self-pay | Admitting: Urology

## 2019-08-24 DIAGNOSIS — R972 Elevated prostate specific antigen [PSA]: Secondary | ICD-10-CM

## 2019-09-04 ENCOUNTER — Other Ambulatory Visit: Payer: Self-pay | Admitting: Family Medicine

## 2019-09-06 NOTE — Telephone Encounter (Signed)
Electronic refill request. Diazepam Last office visit:   07/12/2019 Last Filled:    90 tablet 0 11/19/2018  Please advise.

## 2019-09-07 NOTE — Telephone Encounter (Signed)
Sent. Thanks.   

## 2019-09-08 ENCOUNTER — Other Ambulatory Visit (HOSPITAL_COMMUNITY): Payer: Medicare Other

## 2019-09-12 ENCOUNTER — Ambulatory Visit (HOSPITAL_COMMUNITY)
Admission: RE | Admit: 2019-09-12 | Discharge: 2019-09-12 | Disposition: A | Discharge status: Home / Self Care | Payer: Medicare Other | Source: Ambulatory Visit | Attending: Urology | Admitting: Urology

## 2019-09-12 ENCOUNTER — Other Ambulatory Visit: Payer: Self-pay

## 2019-09-12 DIAGNOSIS — R972 Elevated prostate specific antigen [PSA]: Secondary | ICD-10-CM

## 2019-09-12 MED ORDER — GADOBUTROL 1 MMOL/ML IV SOLN
8.0000 mL | Freq: Once | INTRAVENOUS | Status: AC | PRN
  Administered 2019-09-12: 8 mL via INTRAVENOUS

## 2019-09-15 ENCOUNTER — Encounter: Payer: Self-pay | Admitting: Family Medicine

## 2019-09-20 ENCOUNTER — Other Ambulatory Visit: Payer: Self-pay | Admitting: Cardiology

## 2019-10-04 ENCOUNTER — Telehealth: Payer: Self-pay | Admitting: *Deleted

## 2019-10-04 NOTE — Telephone Encounter (Signed)
   Primary Cardiologist:Mark Marlou Porch, MD  Chart reviewed as part of pre-operative protocol coverage. Because of Keith HOWDESHELL Jr.'s past medical history and time since last visit, they will require a follow-up visit in order to better assess preoperative cardiovascular risk.  Pre-op covering staff: - Please schedule appointment and call patient to inform them. If patient already had an upcoming appointment within acceptable timeframe, please add "pre-op clearance" to the appointment notes so provider is aware. - Please contact requesting surgeon's office via preferred method (i.e, phone, fax) to inform them of need for appointment prior to surgery.  If applicable, this message will also be routed to pharmacy pool and/or primary cardiologist for input on holding anticoagulant/antiplatelet agent as requested below so that this information is available to the clearing provider at time of patient's appointment.   [place], [place]  10/04/2019, 7:11 PM

## 2019-10-04 NOTE — Telephone Encounter (Signed)
° °  [facility] Pre-operative Risk Assessment    HEARTCARE STAFF: - Please ensure there is not already an duplicate clearance open for this procedure. - Under Visit Info/Reason for Call, type in Other and utilize the format Clearance MM/DD/YY or Clearance TBD. Do not use dashes or single digits. - If request is for dental extraction, please clarify the # of teeth to be extracted.  Request for surgical clearance:  1. What type of surgery is being performed?  FUSION PROSTATE BIOPSY    2. When is this surgery scheduled?  11/02/19   3. What type of clearance is required (medical clearance vs. Pharmacy clearance to hold med vs. Both)?  BOTH  4. Are there any medications that need to be held prior to surgery and how long? PLAVIX   5. Practice name and name of physician performing surgery?  [facility] / DR. DAHLSTEDT   6. What is the office phone number?  [phone]   7.   What is the office fax number?  [phone]  ATTN:  EMILY  8.   Anesthesia type (None, local, MAC, general) ?     Jeanann Lewandowsky 10/04/2019, 12:32 PM  _________________________________________________________________   (provider comments below)

## 2019-10-05 NOTE — Telephone Encounter (Signed)
I left a message for the pt to please call the office back so that we may schedule an appt for pre op clearance. We could try and offer the pt 10/10/19 @ 9:15 time slot for Truitt Merle, NP; ok per Rainey Pines. CMA to use time slot 10/10/19.

## 2019-10-06 NOTE — Telephone Encounter (Signed)
Patient returned called, the appt below has been filled.

## 2019-10-06 NOTE — Telephone Encounter (Signed)
Pt has been scheduled to see Truitt Merle, NP 10/25/19 with understanding to arrive at 9:00 for registration.  Will route back to the requesting surgeon's office to make them aware.

## 2019-10-17 NOTE — Progress Notes (Signed)
CARDIOLOGY OFFICE NOTE  Date:  10/25/2019    [name]. Date of Birth: 09/07/1945 Medical Record #[id number]  PCP:  Tonia Ghent, MD  Cardiologist:  [facility]    Chief Complaint  Patient presents with  . Pre-op Exam    Seen for Dr. Marlou Porch    History of Present Illness: [name]. is a 74 y.o. male who presents today for a follow up visit/pre op clearance. Seen for Dr. Marlou Porch.   He has a known history of CAD with prior LAD DES in June of 2017 following abnormal stress testing. Other issues include HTN, HLD and GERD.   He has had prior issues with paranoia/emotional dysfunction - he has attributed to his cardiac medicines. Now on PCSK9 therapy. Does not tolerate Zetia due to diarrhea.   Last seen in September of 2020 byDr. Marlou Porch - felt to be doing ok - had just had shoulder surgery.   Now needing prostate biopsy.   Comes in today. Here alone. Has had a "spot" noted on his prostate. For biopsy next week. No chest pain. No NTG use but needs refills.  He remains on Plavix as monotherapy. Had not been told to stop but needs addressing. He feels good. Playing pickle ball. No chest pain. Breathing is fine. No syncope. Doing all his yard work with out issue. Labs checked by PCP.    Past Medical History:  Diagnosis Date  . Adjustment reaction with physical symptoms   . Anxiety   . Cancer, skin, squamous cell    R shoulder, removed per derm 2012 and chest  . Coronary artery disease    a. 05/2015 c/p post-op surgery; b. 07/2015 MV EF 53%, basal inferoseptal, mid inferoseptal, and apical inferior mild ischemia;  c. 08/2015 Cath/PCI: LM nl, LAD 48m (3.5x20 Promus DES), 3m, LCX nl, RCA nl, EF 60%.  . Diverticulosis 2018   Noted on colonoscopy  . Ear ringing    Left  . Elevated PSA    h/o with normal bx per URO  . GERD (gastroesophageal reflux disease)   . Hearing loss    Slight left ear  . History of BPH   . History of kidney stones   . Hydronephrosis    Left,  urteral obstruction, 2-3 cm UVJ  . Hyperlipidemia   . Hypertension   . Nephrolithiasis   . OA (osteoarthritis)    with r hip replacement  . Osteoarthrosis involving, or with mention of more than one site, but not specified as generalized, site unspecified(715.80)   . Restless legs syndrome (RLS)   . Ruptured lumbar disc   . Syncope    negative cardiac w/u per Dr Irish Lack  . Tick bite 2011   likely tick associated illness, treated with doxy with resolution,  . Viral exanthem, unspecified     Past Surgical History:  Procedure Laterality Date  . ACHILLES TENDON REPAIR Left 1984  . CARDIAC CATHETERIZATION N/A 08/08/2015   Procedure: Left Heart Cath and Coronary Angiography;  Surgeon: Jerline Pain, MD;  Location: [facility];  Service: Cardiovascular;  Laterality: N/A;  . CARDIAC CATHETERIZATION N/A 08/08/2015   Procedure: Coronary Stent Intervention;  Surgeon: Jettie Booze, MD;  Location: [facility];  Service: Cardiovascular;  Laterality: N/A;  . COLONOSCOPY  2018  . CORONARY STENT PLACEMENT  08/08/2015   MID LAD WITH DES  . JOINT REPLACEMENT    . SHOULDER ARTHROSCOPY  2012   R rotator  cuff (biceps tear not repaired)  . SKIN CANCER EXCISION     right shoulder and mid chest  . TOTAL HIP ARTHROPLASTY     right  . TOTAL HIP ARTHROPLASTY Left 05/02/2015   Procedure: TOTAL LEFT HIP ARTHROPLASTY ANTERIOR APPROACH;  Surgeon: Gaynelle Arabian, MD;  Location: [facility];  Service: Orthopedics;  Laterality: Left;  . TOTAL SHOULDER ARTHROPLASTY Left 03/18/2018   Procedure: TOTAL SHOULDER ARTHROPLASTY;  Surgeon: Justice Britain, MD;  Location: [facility];  Service: Orthopedics;  Laterality: Left;  133min     Medications: Current Meds  Medication Sig  . Alcaftadine 0.25 % SOLN Place 1 drop into both eyes daily as needed (LASTACAFT - itchy eyes).   Marland Kitchen amLODipine (NORVASC) 10 MG tablet TAKE 1 TABLET BY MOUTH  DAILY  . Capsaicin-Menthol (SALONPAS GEL-PATCH HOT) 0.025-1.25 % PTCH Apply  topically as needed.  . clopidogrel (PLAVIX) 75 MG tablet Take 1 tablet (75 mg total) by mouth daily with breakfast.  . diazepam (VALIUM) 5 MG tablet TAKE 1 TO 2 TABLETS BY  MOUTH EVERY 12 HOURS AS  NEEDED FOR ANXIETY AND FOR  INSOMNIA, sedation caution  . Homeopathic Products (THERAWORX RELIEF) FOAM   . hydrocortisone cream 1 % Apply 1 application topically daily as needed for itching.  . Lidocaine HCl-Benzyl Alcohol (SALONPAS LIDOCAINE PLUS) 4-10 % CREA Apply topically as needed.  . Liniments (SALONPAS PAIN RELIEF PATCH EX) Apply 1 patch topically daily as needed (pain).  . methocarbamol (ROBAXIN) 500 MG tablet Take 250-500 mg by mouth daily as needed for muscle spasms.  . nitroGLYCERIN (NITROSTAT) 0.4 MG SL tablet Place 1 tablet (0.4 mg total) under the tongue every 5 (five) minutes as needed for chest pain.  Marland Kitchen ondansetron (ZOFRAN) 4 MG tablet Take 1 tablet (4 mg total) by mouth every 8 (eight) hours as needed for nausea or vomiting.  Marland Kitchen OVER THE COUNTER MEDICATION Apply 1 application topically daily as needed (pain - PENETREX Cream).  . pantoprazole (PROTONIX) 40 MG tablet Take 1 tablet (40 mg total) by mouth daily.  Marland Kitchen REPATHA SURECLICK 338 MG/ML SOAJ INJECT 1 PEN UNDER THE SKIN EVERY 14 DAYS  . rOPINIRole (REQUIP) 0.5 MG tablet Take 1 tablet (0.5 mg total) by mouth at bedtime.  Marland Kitchen SIMPLY SALINE NA Place 1 spray into both nostrils 2 (two) times daily.   . traMADol (ULTRAM) 50 MG tablet Take 1-2 tablets (50-100 mg total) by mouth every 8 (eight) hours as needed (pain).  . [DISCONTINUED] nitroGLYCERIN (NITROSTAT) 0.4 MG SL tablet Place 1 tablet (0.4 mg total) under the tongue every 5 (five) minutes as needed for chest pain.     Allergies: Allergies  Allergen Reactions  . Xarelto [Rivaroxaban] Rash    Rash and itching with blisters.  . Alprazolam Other (See Comments)    abnormal dreams  . Benadryl Allergy [Diphenhydramine Hcl]     "makes me nuts" , hyper   . Doxycycline Other (See  Comments)    GI upset  . Haldol [Haloperidol] Other (See Comments)    Worsens restless leg syndrome  . Lactose Intolerance (Gi)   . Lipitor [Atorvastatin] Other (See Comments)    Joints ached  . Melatonin     Worse RLS symptoms  . Morphine And Related Other (See Comments)    intolerant  . Tamsulosin Other (See Comments)    Malaise/ lethargy  . Tylenol [Acetaminophen]     intolerant  . Zolpidem Tartrate Other (See Comments)    Terrible dreams   . Crestor [  Rosuvastatin Calcium] Other (See Comments)    Paranoid   . Zetia [Ezetimibe] Diarrhea    Social History: The patient  reports that he quit smoking about 35 years ago. His smoking use included cigarettes. He smoked 1.00 pack per day. He has never used smokeless tobacco. He reports that he does not drink alcohol and does not use drugs.   Family History: The patient's family history includes Alzheimer's disease in his father; Arrhythmia in his mother; Prostate cancer in his maternal uncle.   Review of Systems: Please see the history of present illness.   All other systems are reviewed and negative.   Physical Exam: VS:  BP 120/84   Pulse 72   Ht 5' 7.5" (1.715 m)   Wt 169 lb 3.2 oz (76.7 kg)   SpO2 95%   BMI 26.11 kg/m  .  BMI Body mass index is 26.11 kg/m.  Wt Readings from Last 3 Encounters:  10/25/19 169 lb 3.2 oz (76.7 kg)  07/12/19 171 lb 9 oz (77.8 kg)  01/11/19 164 lb 5 oz (74.5 kg)    General: Pleasant. Alert and in no acute distress.   Cardiac: Regular rate and rhythm. No murmurs, rubs, or gallops. No edema.  Respiratory:  Lungs are clear to auscultation bilaterally with normal work of breathing.  GI: Soft and nontender.  MS: No deformity or atrophy. Gait and ROM intact.  Skin: Warm and dry. Color is normal.  Neuro:  Strength and sensation are intact and no gross focal deficits noted.  Psych: Alert, appropriate and with normal affect.   LABORATORY DATA:  EKG:  EKG is ordered today.  Personally reviewed  by me. This demonstrates NSR - HR is 72.  Lab Results  Component Value Date   WBC 5.9 01/04/2019   HGB 12.3 (L) 01/04/2019   HCT 38.0 (L) 01/04/2019   PLT 325.0 01/04/2019   GLUCOSE 93 01/04/2019   CHOL 111 01/04/2019   TRIG 84.0 01/04/2019   HDL 50.00 01/04/2019   LDLDIRECT 135.6 09/10/2011   LDLCALC 45 01/04/2019   ALT 12 01/04/2019   AST 18 01/04/2019   NA 137 01/04/2019   K 4.2 01/04/2019   CL 102 01/04/2019   CREATININE 0.83 01/04/2019   BUN 7 01/04/2019   CO2 27 01/04/2019   PSA 6.64 (H) 07/05/2019   INR 1.00 08/01/2015     BNP (last 3 results) No results for input(s): BNP in the last 8760 hours.  ProBNP (last 3 results) No results for input(s): PROBNP in the last 8760 hours.   Other Studies Reviewed Today:  Left Heart Cath and Coronary Angiography 08/2015  Conclusion   Mid LAD lesion, 85% stenosed. 20% lesion approximate 20 mm past focal mid LAD lesion as described above.  Normal left ventricular function, EF 60% no wall motion abnormality  No aortic stenosis  Normal appearing coronary arteries otherwise.   Plan is to have evaluation by Dr. Irish Lack. He had chest discomfort postoperatively hip replacement, mid LAD lesion likely culprit. Nuclear stress test abnormal.   Candee Furbish, MD  Coronary Stent Intervention 08/2015  Conclusion   Mid LAD-1 lesion, 90% stenosed. Post intervention with a 3.5 x 20 Promus drug-eluting stent, postdilated to 3.6 mm, there is a 0% residual stenosis.   Continue dual antiplatelet therapy for at least a year. At that point, consider stopping aspirin. Continue aggressive secondary prevention. He'll be watched overnight. Possible discharge tomorrow.    ASSESSMENT & PLAN:    1. Pre op clearance for  prostate biopsy next week - ok to proceed from our standpoint. Will hold Plavix - he has taken today but will now stop - to resume when ok by GU.   2. CAD - remote LAD DES from 2017 - no worrisome symptoms. Continue with CV  risk factor modification. NTG refilled for him today.   3. HLD - on statin - labs by PCP - he is on PCKS9 therapy.   4. HTN - BP is at goal on his current regimen - would continue Norvasc 10 mg   Current medicines are reviewed with the patient today.  The patient does not have concerns regarding medicines other than what has been noted above.  The following changes have been made:  See above.  Labs/ tests ordered today include:    Orders Placed This Encounter  Procedures  . EKG 12-Lead     Disposition:   FU with Dr. Marlou Porch in one year. Will be available as needed.     Patient is agreeable to this plan and will call if any problems develop in the interim.   SignedTruitt Merle, NP  10/25/2019 9:56 AM  [facility] [address] [place], [place]  [address] Phone: [phone] Fax: [phone]

## 2019-10-25 ENCOUNTER — Ambulatory Visit (INDEPENDENT_AMBULATORY_CARE_PROVIDER_SITE_OTHER): Payer: Medicare Other | Admitting: Nurse Practitioner

## 2019-10-25 ENCOUNTER — Encounter: Payer: Self-pay | Admitting: Nurse Practitioner

## 2019-10-25 ENCOUNTER — Other Ambulatory Visit: Payer: Self-pay

## 2019-10-25 VITALS — BP 120/84 | HR 72 | Ht 67.5 in | Wt 169.2 lb

## 2019-10-25 DIAGNOSIS — Z789 Other specified health status: Secondary | ICD-10-CM

## 2019-10-25 DIAGNOSIS — E785 Hyperlipidemia, unspecified: Secondary | ICD-10-CM | POA: Diagnosis not present

## 2019-10-25 DIAGNOSIS — Z01810 Encounter for preprocedural cardiovascular examination: Secondary | ICD-10-CM | POA: Diagnosis not present

## 2019-10-25 DIAGNOSIS — I2510 Atherosclerotic heart disease of native coronary artery without angina pectoris: Secondary | ICD-10-CM | POA: Diagnosis not present

## 2019-10-25 MED ORDER — NITROGLYCERIN 0.4 MG SL SUBL: 0.4000 mg | SUBLINGUAL_TABLET | SUBLINGUAL | 3 refills | Status: DC | PRN

## 2019-10-25 NOTE — Patient Instructions (Addendum)
After Visit Summary:  We will be checking the following labs today - NONE   Medication Instructions:    Continue with your current medicines.    If you need a refill on your cardiac medications before your next appointment, please call your pharmacy.     Testing/Procedures To Be Arranged:  N/A  Follow-Up:   See Dr. Marlou Porch in one year -  You will receive a reminder letter in the mail two months in advance. If you don't receive a letter, please call our office to schedule the follow-up appointment.   At [facility], you and your health needs are our priority.  As part of our continuing mission to provide you with exceptional heart care, we have created designated Provider Care Teams.  These Care Teams include your primary Cardiologist (physician) and Advanced Practice Providers (APPs -  Physician Assistants and Nurse Practitioners) who all work together to provide you with the care you need, when you need it.  Special Instructions:  . Stay safe, wash your hands for at least 20 seconds and wear a mask when needed.  . It was good to talk with you today.  Madaline Brilliant to proceed with your prostate biopsy next week - would hold Plavix starting after today's dose   Call the [place] office at [phone] if you have any questions, problems or concerns.

## 2019-10-26 ENCOUNTER — Other Ambulatory Visit: Payer: Self-pay | Admitting: Family Medicine

## 2019-10-27 NOTE — Telephone Encounter (Signed)
Electronic refill request. Tramadol Last office visit:   07/12/2019 Last Filled:    180 tablet 2 07/27/2019

## 2019-10-28 NOTE — Telephone Encounter (Signed)
Sent. Thanks.   

## 2019-11-10 ENCOUNTER — Encounter: Payer: Self-pay | Admitting: Family Medicine

## 2019-11-11 ENCOUNTER — Encounter: Payer: Self-pay | Admitting: Family Medicine

## 2019-11-11 ENCOUNTER — Telehealth: Payer: Self-pay | Admitting: Family Medicine

## 2019-11-11 NOTE — Telephone Encounter (Signed)
Please triage patient about UTI sx, testicle swelling, etc.  We may need urology input re: urinary sx.  Thanks.

## 2019-11-11 NOTE — Telephone Encounter (Signed)
Would by okay to change requip to valium temporarily at night, taking 1/2 of 2mg  valium qhs.  When done with abx, then try to change back to requip.  I'll defer to urology o/w.  Thanks.

## 2019-11-11 NOTE — Telephone Encounter (Signed)
Patient advised.

## 2019-11-11 NOTE — Telephone Encounter (Signed)
Pt called back to get clarification of the names of the OTC meds pt was told to get by [facility] nurse at Dr Dahlstedt's office. I explained that he had spoken to [place] and I am in a different office and do not have that info and I would be glad to transfer pt to Dr Dahlstedt's office and pt said no he would just get what that nurse told him to get. Nothing further needed.

## 2019-11-11 NOTE — Telephone Encounter (Signed)
I spoke with pt and he said that both testicles are swollen the size of peaches with tenderness and not painful. Pt also has rt lower abd pain on and off. No fever. Pt said he spoke with Dr Diona Fanti on 11/09/19 about prostate biopsy done on 11/02/19 and was advised that 4 samples at site were benign and 11 25f 12 other samples of prostate were benign and probably would require no treatment but would schedule FU appt to discuss further. Pt said abx was also changed on 11/09/19 from cipro to augmentin taking q 12 h. Pt thinks he either needs stronger dose of abx or needs to take abx q 9 h.  I called [facility] and spoke with [facility] triage nurse and started telling her above info and Jenny Reichmann said that Dr Diona Fanti had responded to pts message earlier today. Jenny Reichmann said that pt was to take antiinflammatory, wear tight briefs and give abx time to work. I conference Jenny Reichmann and pt together on phone so she could give Dr Diona Fanti' instructions and take any further questions or concerns from pt.    Question for Dr [name] is below; Pt said RLS has been bothering pt last few nights and pt has not slept well due to RLS and pt read where should not take Ropinirole with Augmentin. Pt wants to know if needs to take something else for RLS. Pt said had taken Valium 2 Mg before and last night pt took 1/2 tab of Valium 2 mg at hs and pt slept well. Pt has plenty of Valium 2 mg if Dr [name] thinks that would be appropriate for pt to take to help him rest at night. Pt request cb. [facility] [address].

## 2019-11-12 ENCOUNTER — Encounter: Payer: Self-pay | Admitting: Family Medicine

## 2019-11-13 ENCOUNTER — Encounter: Payer: Self-pay | Admitting: Family Medicine

## 2019-11-13 NOTE — Telephone Encounter (Signed)
Late entry.  See prev phone note to address his concerns.

## 2019-11-14 ENCOUNTER — Other Ambulatory Visit: Payer: Self-pay | Admitting: *Deleted

## 2019-11-14 ENCOUNTER — Encounter: Payer: Self-pay | Admitting: Family Medicine

## 2019-11-14 NOTE — Telephone Encounter (Signed)
Patient requested refill on Tramadol to [facility] Last office visit:   07/12/2019 Last Filled:     180 tablet 1 10/28/2019

## 2019-11-15 NOTE — Telephone Encounter (Signed)
Noted.  I cancelled this in the meantime.  Thanks.

## 2019-11-15 NOTE — Telephone Encounter (Signed)
He sent a message after this and explained that the pharmacy told him he had picked up a prescription and he had forgotten but found it at home.

## 2019-11-15 NOTE — Telephone Encounter (Signed)
Please verify with pharmacy.  He should have another refill that was previously sent.  Thanks.

## 2019-11-21 ENCOUNTER — Encounter: Payer: Self-pay | Admitting: Family Medicine

## 2019-11-21 DIAGNOSIS — C61 Malignant neoplasm of prostate: Secondary | ICD-10-CM | POA: Insufficient documentation

## 2019-12-04 ENCOUNTER — Encounter: Payer: Self-pay | Admitting: Family Medicine

## 2019-12-14 ENCOUNTER — Encounter: Payer: Self-pay | Admitting: Family Medicine

## 2019-12-21 ENCOUNTER — Encounter: Payer: Self-pay | Admitting: Family Medicine

## 2019-12-21 DIAGNOSIS — G720 Drug-induced myopathy: Secondary | ICD-10-CM | POA: Insufficient documentation

## 2019-12-21 NOTE — Assessment & Plan Note (Signed)
Statin intolerant.  He is on repatha per cards.

## 2019-12-30 ENCOUNTER — Other Ambulatory Visit: Payer: Self-pay | Admitting: Family Medicine

## 2019-12-30 NOTE — Telephone Encounter (Signed)
Electronic refill request. Tramadol Last office visit:   07/12/2019 Last Filled:    180 tablet 1 10/28/2019

## 2020-01-01 NOTE — Telephone Encounter (Signed)
Sent. Thanks.   

## 2020-01-07 ENCOUNTER — Encounter: Payer: Self-pay | Admitting: Family Medicine

## 2020-01-17 ENCOUNTER — Encounter: Payer: Self-pay | Admitting: Family Medicine

## 2020-01-18 ENCOUNTER — Other Ambulatory Visit: Payer: Self-pay | Admitting: Family Medicine

## 2020-01-18 MED ORDER — MAGNESIUM OXIDE 400 MG PO CAPS: 400.0000 mg | ORAL_CAPSULE | Freq: Every evening | ORAL | 0 refills | Status: DC

## 2020-01-23 ENCOUNTER — Telehealth: Payer: Self-pay | Admitting: Family Medicine

## 2020-01-23 NOTE — Telephone Encounter (Signed)
Called pt, left vm to call office to schedule physical

## 2020-01-23 NOTE — Telephone Encounter (Signed)
-----   Message from Elie Confer sent at 01/23/2020  7:57 AM EST ----- Good morning,  Would you be able to reach out to this patient to schedule? Let me know if you have any questions.  Thanks, Raquel Sarna ----- Message ----- From: Tonia Ghent, MD Sent: 01/11/2020   8:43 PM EST To: Elie Confer  Can he get a yearly Medicare wellness visit scheduled with Dewitt Hoes and me?

## 2020-01-25 ENCOUNTER — Other Ambulatory Visit: Payer: Self-pay | Admitting: Family Medicine

## 2020-01-30 ENCOUNTER — Other Ambulatory Visit: Payer: Self-pay | Admitting: Family Medicine

## 2020-01-30 NOTE — Telephone Encounter (Signed)
Refill request Requip Last refill 01/11/19 #90/3 Last office visit 07/12/19

## 2020-01-31 MED ORDER — ROPINIROLE HCL 0.5 MG PO TABS
0.5000 mg | ORAL_TABLET | Freq: Every day | ORAL | 3 refills | Status: DC
Start: 2020-01-31 — End: 2020-12-25

## 2020-01-31 NOTE — Telephone Encounter (Signed)
Sent. Thanks.   

## 2020-02-09 ENCOUNTER — Telehealth: Payer: Self-pay | Admitting: Family Medicine

## 2020-02-09 MED ORDER — ROPINIROLE HCL 0.5 MG PO TABS
0.5000 mg | ORAL_TABLET | Freq: Every day | ORAL | 0 refills | Status: DC
Start: 2020-02-09 — End: 2020-02-13

## 2020-02-09 NOTE — Telephone Encounter (Signed)
Please advise 

## 2020-02-09 NOTE — Telephone Encounter (Signed)
Sent.  I sent a 1 month supply.  That may not be any more expensive than a 1 week supply- have him check with pharmacy and update me as needed.  Thanks.

## 2020-02-09 NOTE — Telephone Encounter (Signed)
Patient came by the office. Dr.Duncan filled patient's Ropinirole about 10 days ago to [facility].  Patient hasn't received his rx,yet. Patient ran out a couple days ago and he had an episode last night. Patient wants to know if a refill for a 1 week rx can be sent to [facility].

## 2020-02-10 NOTE — Telephone Encounter (Signed)
Spoke with pt to let him know that [facility] sent his Rx in

## 2020-02-13 ENCOUNTER — Other Ambulatory Visit: Payer: Self-pay | Admitting: Family Medicine

## 2020-02-15 ENCOUNTER — Encounter: Payer: Self-pay | Admitting: Family Medicine

## 2020-02-19 ENCOUNTER — Other Ambulatory Visit: Payer: Self-pay | Admitting: Family Medicine

## 2020-02-19 DIAGNOSIS — E785 Hyperlipidemia, unspecified: Secondary | ICD-10-CM

## 2020-02-24 ENCOUNTER — Other Ambulatory Visit: Payer: Self-pay | Admitting: Family Medicine

## 2020-02-27 ENCOUNTER — Other Ambulatory Visit (INDEPENDENT_AMBULATORY_CARE_PROVIDER_SITE_OTHER): Payer: Medicare Other

## 2020-02-27 ENCOUNTER — Other Ambulatory Visit: Payer: Self-pay

## 2020-02-27 DIAGNOSIS — E785 Hyperlipidemia, unspecified: Secondary | ICD-10-CM | POA: Diagnosis not present

## 2020-02-27 LAB — COMPREHENSIVE METABOLIC PANEL
ALT: 16 U/L (ref 0–53)
AST: 19 U/L (ref 0–37)
Albumin: 4.3 g/dL (ref 3.5–5.2)
Alkaline Phosphatase: 68 U/L (ref 39–117)
BUN: 9 mg/dL (ref 6–23)
CO2: 30 mEq/L (ref 19–32)
Calcium: 8.9 mg/dL (ref 8.4–10.5)
Chloride: 100 mEq/L (ref 96–112)
Creatinine, Ser: 0.82 mg/dL (ref 0.40–1.50)
GFR: 86.42 mL/min (ref >60.00)
Glucose, Bld: 92 mg/dL (ref 70–99)
Potassium: 3.6 mEq/L (ref 3.5–5.1)
Sodium: 136 mEq/L (ref 135–145)
Total Bilirubin: 0.4 mg/dL (ref 0.2–1.2)
Total Protein: 7.1 g/dL (ref 6.0–8.3)

## 2020-02-27 LAB — CBC WITH DIFFERENTIAL/PLATELET
Basophils Absolute: 0.1 10*3/uL (ref 0.0–0.1)
Basophils Relative: 1.1 % (ref 0.0–3.0)
Eosinophils Absolute: 0.1 10*3/uL (ref 0.0–0.7)
Eosinophils Relative: 1.9 % (ref 0.0–5.0)
HCT: 40.5 % (ref 39.0–52.0)
Hemoglobin: 13.3 g/dL (ref 13.0–17.0)
Lymphocytes Relative: 40.4 % (ref 12.0–46.0)
Lymphs Abs: 2.5 10*3/uL (ref 0.7–4.0)
MCHC: 32.9 g/dL (ref 30.0–36.0)
MCV: 86.3 fl (ref 78.0–100.0)
Monocytes Absolute: 0.5 10*3/uL (ref 0.1–1.0)
Monocytes Relative: 7.5 % (ref 3.0–12.0)
Neutro Abs: 3.1 10*3/uL (ref 1.4–7.7)
Neutrophils Relative %: 49.1 % (ref 43.0–77.0)
Platelets: 325 10*3/uL (ref 150.0–400.0)
RBC: 4.7 Mil/uL (ref 4.22–5.81)
RDW: 13.9 % (ref 11.5–15.5)
WBC: 6.3 10*3/uL (ref 4.0–10.5)

## 2020-02-27 LAB — LIPID PANEL
Cholesterol: 125 mg/dL (ref 0–200)
HDL: 49.4 mg/dL (ref >39.00)
LDL Cholesterol: 52 mg/dL (ref 0–99)
NonHDL: 76
Total CHOL/HDL Ratio: 3
Triglycerides: 122 mg/dL (ref 0.0–149.0)
VLDL: 24.4 mg/dL (ref 0.0–40.0)

## 2020-02-29 ENCOUNTER — Other Ambulatory Visit: Payer: Self-pay | Admitting: Family Medicine

## 2020-03-01 NOTE — Telephone Encounter (Signed)
Sent. Thanks.   

## 2020-03-01 NOTE — Telephone Encounter (Signed)
Please Advise

## 2020-03-05 ENCOUNTER — Other Ambulatory Visit: Payer: Self-pay

## 2020-03-05 ENCOUNTER — Ambulatory Visit (INDEPENDENT_AMBULATORY_CARE_PROVIDER_SITE_OTHER): Payer: Medicare Other | Admitting: Family Medicine

## 2020-03-05 ENCOUNTER — Encounter: Payer: Self-pay | Admitting: Family Medicine

## 2020-03-05 DIAGNOSIS — G720 Drug-induced myopathy: Secondary | ICD-10-CM

## 2020-03-05 DIAGNOSIS — Z0000 Encounter for general adult medical examination without abnormal findings: Secondary | ICD-10-CM

## 2020-03-05 DIAGNOSIS — G4700 Insomnia, unspecified: Secondary | ICD-10-CM

## 2020-03-05 DIAGNOSIS — E785 Hyperlipidemia, unspecified: Secondary | ICD-10-CM

## 2020-03-05 DIAGNOSIS — I10 Essential (primary) hypertension: Secondary | ICD-10-CM | POA: Diagnosis not present

## 2020-03-05 DIAGNOSIS — C61 Malignant neoplasm of prostate: Secondary | ICD-10-CM

## 2020-03-05 DIAGNOSIS — Z7189 Other specified counseling: Secondary | ICD-10-CM

## 2020-03-05 DIAGNOSIS — G2581 Restless legs syndrome: Secondary | ICD-10-CM

## 2020-03-05 NOTE — Patient Instructions (Signed)
Don't change your meds for now.  Update me as needed.  Have the pharmacy update [place] when you need refills.  Take care.  Glad to see you.

## 2020-03-05 NOTE — Progress Notes (Signed)
This visit occurred during the SARS-CoV-2 public health emergency.  Safety protocols were in place, including screening questions prior to the visit, additional usage of staff PPE, and extensive cleaning of exam room while observing appropriate contact time as indicated for disinfecting solutions.  Elevated Cholesterol: Using medications without problems: yes Muscle aches: minimal aches the day after Repatha injection.  No aches otherwise.  Tolerable.   Diet compliance: yes Exercise: yes  RLS.  Better with requip.  No ADE on med.  He was able to get refills on the med in the meantime.  He clearly had more sx when off med.    Knee and back pain better with tramadol w/o ADE.    Hypertension:    Using medication without problems or lightheadedness: yes Chest pain with exertion:no Edema:no Short of breath:no He is active, playing pickle ball, working in the yard.    He isn't on mag sulfate.  He is sleeping on a bed with head elevated.  That helped with sleep.  D/w pt about not starting med. Cramps better with theraworks.  Used prn.    He is seeing urology about prostate cancer.    Flu shot 2021 Tetanus 2018 PNA UTD Shingles up to date covid vaccine up to date.   Colonoscopy 2018 Living will d/w pt. Wife designated if patient were incapacitated No AAA on prev CT.   He is putting up with tinnitus.    PMH and SH reviewed  Meds, vitals, and allergies reviewed.   ROS: Per HPI unless specifically indicated in ROS section   GEN: nad, alert and oriented HEENT: ncat NECK: supple w/o LA CV: rrr. PULM: ctab, no inc wob ABD: soft, +bs EXT: no edema SKIN: no acute rash    32 minutes were devoted to patient care in this encounter (this includes time spent reviewing the patient's file/history, interviewing and examining the patient, counseling/reviewing plan with patient).

## 2020-03-08 NOTE — Assessment & Plan Note (Signed)
Better with requip.  No ADE on med.  He was able to get refills on the med in the meantime.  He clearly had more sx when off med.  Continue Requip as is.

## 2020-03-08 NOTE — Assessment & Plan Note (Signed)
He is seeing urology about prostate cancer.   I will defer.  He agrees.

## 2020-03-08 NOTE — Assessment & Plan Note (Signed)
Sleeping better with head of bed elevated.  Continue as is.

## 2020-03-08 NOTE — Assessment & Plan Note (Signed)
Continue amlodipine, continue work on diet and exercise.  Labs discussed with patient.

## 2020-03-08 NOTE — Assessment & Plan Note (Signed)
Living will d/w pt.  Wife designated if patient were incapacitated.   ?

## 2020-03-08 NOTE — Assessment & Plan Note (Signed)
Labs discussed with patient.  Continue Repatha.  Continue work on diet and exercise.  He agrees.

## 2020-03-08 NOTE — Assessment & Plan Note (Signed)
Flu shot 2021 Tetanus 2018 PNA UTD Shingles up to date covid vaccine up to date.   Colonoscopy 2018 Living will d/w pt. Wife designated if patient were incapacitated No AAA on prev CT.

## 2020-03-08 NOTE — Assessment & Plan Note (Signed)
Statin intolerant 

## 2020-03-26 ENCOUNTER — Other Ambulatory Visit: Payer: Self-pay | Admitting: Family Medicine

## 2020-03-28 NOTE — Telephone Encounter (Signed)
Pharmacy requests refill on: Clopidogrel 75 mg   LAST REFILL: 01/11/2019 (Q-90, R-3) LAST OV: 03/05/2020 NEXT OV: Not Scheduled  PHARMACY: [facility]

## 2020-04-05 ENCOUNTER — Encounter: Payer: Self-pay | Admitting: Family Medicine

## 2020-04-30 ENCOUNTER — Other Ambulatory Visit: Payer: Self-pay | Admitting: Family Medicine

## 2020-04-30 NOTE — Telephone Encounter (Signed)
Refill request Tramadol Last refill 03/01/20 #180/1 Last office visit 03/05/20

## 2020-05-01 NOTE — Telephone Encounter (Signed)
Sent. Thanks.   

## 2020-05-01 NOTE — Telephone Encounter (Signed)
Pt left v/m that he had been advised [facility] [address] that his tramadol rx had been denied and pt wanted to know why. I spoke with Apolonio Schneiders at [employer] and she said they got denial and then was resent with approval earlier this morning; do not see denial on pts chart but Apolonio Schneiders said that tramadol would be ready for pick up within the hr. I called pt and he was on another line and pts wife notified (DPR signed) about above conversation and tramadol should be ready for p ickup today by 12:45. pts wife appreciative and will let pt know. Nothing further needed.

## 2020-05-23 ENCOUNTER — Other Ambulatory Visit: Payer: Self-pay | Admitting: Family Medicine

## 2020-05-29 ENCOUNTER — Other Ambulatory Visit: Payer: Self-pay | Admitting: Family Medicine

## 2020-05-31 ENCOUNTER — Encounter: Payer: Self-pay | Admitting: Family Medicine

## 2020-06-22 ENCOUNTER — Encounter: Payer: Self-pay | Admitting: Family Medicine

## 2020-06-28 ENCOUNTER — Other Ambulatory Visit: Payer: Self-pay | Admitting: Family Medicine

## 2020-06-28 NOTE — Telephone Encounter (Signed)
Refill request for Tramadol 50 mg tablets  LOV - 03/15/20 Next OV - not scheduled Last refill - 05/01/20 #180/1

## 2020-06-29 NOTE — Telephone Encounter (Signed)
Sent. Thanks.   

## 2020-07-02 ENCOUNTER — Encounter: Payer: Self-pay | Admitting: Family Medicine

## 2020-08-09 ENCOUNTER — Other Ambulatory Visit: Payer: Self-pay | Admitting: Family Medicine

## 2020-08-10 NOTE — Telephone Encounter (Signed)
Last filled 09-07-19 #90 Last OV 03-05-20 No Future OV [facility]

## 2020-08-12 NOTE — Telephone Encounter (Signed)
Sent. Thanks.   

## 2020-09-06 ENCOUNTER — Other Ambulatory Visit: Payer: Self-pay | Admitting: Family Medicine

## 2020-09-07 NOTE — Telephone Encounter (Signed)
Refill request for Tramadol 50 mg tablets  LOV - 03/05/20 Next OV - not scheduled Last refill - 06/29/20 #180/1

## 2020-09-10 ENCOUNTER — Other Ambulatory Visit: Payer: Self-pay | Admitting: Cardiology

## 2020-09-10 ENCOUNTER — Telehealth: Payer: Self-pay | Admitting: *Deleted

## 2020-09-10 DIAGNOSIS — I2510 Atherosclerotic heart disease of native coronary artery without angina pectoris: Secondary | ICD-10-CM

## 2020-09-10 DIAGNOSIS — E785 Hyperlipidemia, unspecified: Secondary | ICD-10-CM

## 2020-09-10 NOTE — Telephone Encounter (Signed)
Patient called stating that he got a message from [facility] that they have not gotten the refill from Dr. [name] on his Tramadol. Patient stated that he wants to know why. Epic shows that refill was sent in 7/10//22 and it was [facility] by [facility].  Called and spoke to the pharmacist and was advised that they have the script ready for the patient. Called and advised patient that [facility] has his Tramadol ready for him to pick up. Patient stated that he is confused because he has gotten several messages from [facility] regarding his Tramadol. Patient stated that he feels that the problem is with [facility] and will discuss the issues with them when he picks the script up.

## 2020-09-21 ENCOUNTER — Telehealth: Payer: Self-pay

## 2020-09-21 NOTE — Telephone Encounter (Signed)
Received a fax from the patients pharmacy stating the Repatha is causing diarrhea and the injection site had a reaction.

## 2020-09-21 NOTE — Telephone Encounter (Signed)
Patient has been taking Repatha since 2019 so unlikely to be the cause of diarrhea.  Recommend he alternate injection sites if he had a reaction or schedule visit with PCP.

## 2020-09-24 NOTE — Telephone Encounter (Signed)
Left detailed message on patients voicemail with pharmacist recommendations. Advised a call back with any questions or concerns.

## 2020-10-19 ENCOUNTER — Emergency Department (HOSPITAL_BASED_OUTPATIENT_CLINIC_OR_DEPARTMENT_OTHER)
Admission: EM | Admit: 2020-10-19 | Discharge: 2020-10-19 | Disposition: A | Discharge status: Home / Self Care | Payer: Medicare Other | Attending: Emergency Medicine | Admitting: Emergency Medicine

## 2020-10-19 ENCOUNTER — Emergency Department (HOSPITAL_BASED_OUTPATIENT_CLINIC_OR_DEPARTMENT_OTHER): Payer: Medicare Other

## 2020-10-19 ENCOUNTER — Encounter (HOSPITAL_BASED_OUTPATIENT_CLINIC_OR_DEPARTMENT_OTHER): Payer: Self-pay | Admitting: *Deleted

## 2020-10-19 ENCOUNTER — Other Ambulatory Visit: Payer: Self-pay

## 2020-10-19 ENCOUNTER — Encounter: Payer: Self-pay | Admitting: Family Medicine

## 2020-10-19 DIAGNOSIS — K529 Noninfective gastroenteritis and colitis, unspecified: Secondary | ICD-10-CM

## 2020-10-19 DIAGNOSIS — R110 Nausea: Secondary | ICD-10-CM | POA: Insufficient documentation

## 2020-10-19 DIAGNOSIS — Z96612 Presence of left artificial shoulder joint: Secondary | ICD-10-CM | POA: Diagnosis not present

## 2020-10-19 DIAGNOSIS — Z8546 Personal history of malignant neoplasm of prostate: Secondary | ICD-10-CM | POA: Insufficient documentation

## 2020-10-19 DIAGNOSIS — Z96643 Presence of artificial hip joint, bilateral: Secondary | ICD-10-CM | POA: Insufficient documentation

## 2020-10-19 DIAGNOSIS — Z85828 Personal history of other malignant neoplasm of skin: Secondary | ICD-10-CM | POA: Diagnosis not present

## 2020-10-19 DIAGNOSIS — I10 Essential (primary) hypertension: Secondary | ICD-10-CM | POA: Diagnosis not present

## 2020-10-19 DIAGNOSIS — Z79899 Other long term (current) drug therapy: Secondary | ICD-10-CM | POA: Insufficient documentation

## 2020-10-19 DIAGNOSIS — Z87891 Personal history of nicotine dependence: Secondary | ICD-10-CM | POA: Diagnosis not present

## 2020-10-19 DIAGNOSIS — R197 Diarrhea, unspecified: Secondary | ICD-10-CM | POA: Diagnosis not present

## 2020-10-19 DIAGNOSIS — I2510 Atherosclerotic heart disease of native coronary artery without angina pectoris: Secondary | ICD-10-CM | POA: Diagnosis not present

## 2020-10-19 DIAGNOSIS — R109 Unspecified abdominal pain: Secondary | ICD-10-CM | POA: Insufficient documentation

## 2020-10-19 LAB — COMPREHENSIVE METABOLIC PANEL
ALT: 18 U/L (ref 0–44)
AST: 24 U/L (ref 15–41)
Albumin: 4.6 g/dL (ref 3.5–5.0)
Alkaline Phosphatase: 83 U/L (ref 38–126)
Anion gap: 11 (ref 5–15)
BUN: 13 mg/dL (ref 8–23)
CO2: 25 mmol/L (ref 22–32)
Calcium: 9.3 mg/dL (ref 8.9–10.3)
Chloride: 98 mmol/L (ref 98–111)
Creatinine, Ser: 1.11 mg/dL (ref 0.61–1.24)
GFR, Estimated: >60 mL/min (ref >60)
Glucose, Bld: 110 mg/dL — ABNORMAL HIGH (ref 70–99)
Potassium: 3.7 mmol/L (ref 3.5–5.1)
Sodium: 134 mmol/L — ABNORMAL LOW (ref 135–145)
Total Bilirubin: 0.7 mg/dL (ref 0.3–1.2)
Total Protein: 8 g/dL (ref 6.5–8.1)

## 2020-10-19 LAB — URINALYSIS, ROUTINE W REFLEX MICROSCOPIC
Bilirubin Urine: NEGATIVE
Glucose, UA: NEGATIVE mg/dL
Hgb urine dipstick: NEGATIVE
Ketones, ur: 40 mg/dL — AB
Leukocytes,Ua: NEGATIVE
Nitrite: NEGATIVE
Protein, ur: NEGATIVE mg/dL
Specific Gravity, Urine: 1.01 (ref 1.005–1.030)
pH: 6 (ref 5.0–8.0)

## 2020-10-19 LAB — LIPASE, BLOOD: Lipase: 20 U/L (ref 11–51)

## 2020-10-19 LAB — CBC
HCT: 46.7 % (ref 39.0–52.0)
Hemoglobin: 15.7 g/dL (ref 13.0–17.0)
MCH: 29.2 pg (ref 26.0–34.0)
MCHC: 33.6 g/dL (ref 30.0–36.0)
MCV: 87 fL (ref 80.0–100.0)
Platelets: 350 10*3/uL (ref 150–400)
RBC: 5.37 MIL/uL (ref 4.22–5.81)
RDW: 13.2 % (ref 11.5–15.5)
WBC: 16.2 10*3/uL — ABNORMAL HIGH (ref 4.0–10.5)
nRBC: 0 % (ref 0.0–0.2)

## 2020-10-19 MED ORDER — ONDANSETRON 4 MG PO TBDP: ORAL_TABLET | ORAL | 0 refills | Status: DC

## 2020-10-19 MED ORDER — HYDROMORPHONE HCL 1 MG/ML IJ SOLN
1.0000 mg | Freq: Once | INTRAMUSCULAR | Status: AC
Start: 2020-10-19 — End: 2020-10-19
  Administered 2020-10-19: 1 mg via INTRAVENOUS
  Filled 2020-10-19: qty 1

## 2020-10-19 MED ORDER — IOHEXOL 300 MG/ML  SOLN
100.0000 mL | Freq: Once | INTRAMUSCULAR | Status: AC | PRN
  Administered 2020-10-19: 100 mL via INTRAVENOUS

## 2020-10-19 MED ORDER — CIPROFLOXACIN HCL 500 MG PO TABS: 500.0000 mg | ORAL_TABLET | Freq: Two times a day (BID) | ORAL | 0 refills | Status: DC

## 2020-10-19 MED ORDER — CIPROFLOXACIN HCL 500 MG PO TABS
500.0000 mg | ORAL_TABLET | Freq: Once | ORAL | Status: AC
  Administered 2020-10-19: 500 mg via ORAL
  Filled 2020-10-19: qty 1

## 2020-10-19 MED ORDER — METRONIDAZOLE 500 MG PO TABS
500.0000 mg | ORAL_TABLET | Freq: Once | ORAL | Status: AC
  Administered 2020-10-19: 500 mg via ORAL
  Filled 2020-10-19: qty 1

## 2020-10-19 MED ORDER — METRONIDAZOLE 500 MG PO TABS: 500.0000 mg | ORAL_TABLET | Freq: Two times a day (BID) | ORAL | 0 refills | Status: DC

## 2020-10-19 MED ORDER — ONDANSETRON HCL 4 MG/2ML IJ SOLN
4.0000 mg | Freq: Once | INTRAMUSCULAR | Status: AC
  Administered 2020-10-19: 4 mg via INTRAVENOUS
  Filled 2020-10-19: qty 2

## 2020-10-19 MED ORDER — SODIUM CHLORIDE 0.9 % IV BOLUS
1000.0000 mL | Freq: Once | INTRAVENOUS | Status: AC
  Administered 2020-10-19: 1000 mL via INTRAVENOUS

## 2020-10-19 NOTE — ED Triage Notes (Signed)
Severe abdominal pain since 10am. Vomiting. Diarrhea.

## 2020-10-19 NOTE — Discharge Instructions (Addendum)
You likely have colon infection.  Take Cipro and Flagyl as prescribed  Take Zofran for nausea   Stay hydrated  See your doctor for follow-up  Return to ER if you have worse abdominal pain, vomiting, fever

## 2020-10-19 NOTE — Telephone Encounter (Signed)
Pt also called the triage line and left a message asking what to do. I called him back and advised him there is nothing Dr [name] could give him if it is his gallbladder. But I thought he needed to go to the ER to be evaluated to rule out gallbladder, They can do the appropriate imaging and if it is bad enough, they can call in surgeons. If he waited to come here next week, that would delay imaging and imaging can be delayed by a few days. He will go to [facility] .

## 2020-10-19 NOTE — ED Provider Notes (Signed)
[facility] EMERGENCY DEPARTMENT Provider Note   CSN: [id number] Arrival date & time: 10/19/20  1757     History Chief Complaint  Patient presents with   Abdominal Pain    Keith Menor. is a 75 y.o. male hx of CAD, HTN, here presenting with abdominal pain and diarrhea and nausea.  Patient states since this morning, he has been having watery diarrhea.  He states that he has 8-10 episodes.  He has also severe abdominal pain and cramping.  Patient denies any fevers.  He is nauseated but has no vomiting.  Denies any sick contacts  The history is provided by the patient.      Past Medical History:  Diagnosis Date   Adjustment reaction with physical symptoms    Anxiety    Cancer, skin, squamous cell    R shoulder, removed per derm 2012 and chest   Coronary artery disease    a. 05/2015 c/p post-op surgery; b. 07/2015 MV EF 53%, basal inferoseptal, mid inferoseptal, and apical inferior mild ischemia;  c. 08/2015 Cath/PCI: LM nl, LAD 100m(3.5x20 Promus DES), 252mLCX nl, RCA nl, EF 60%.   Diverticulosis 2018   Noted on colonoscopy   Ear ringing    Left   Elevated PSA    h/o with normal bx per URO   GERD (gastroesophageal reflux disease)    Hearing loss    Slight left ear   History of BPH    History of kidney stones    Hydronephrosis    Left, urteral obstruction, 2-3 cm UVJ   Hyperlipidemia    Hypertension    Nephrolithiasis    OA (osteoarthritis)    with r hip replacement   Osteoarthrosis involving, or with mention of more than one site, but not specified as generalized, site unspecified(715.80)    Prostate cancer (HCC)    Restless legs syndrome (RLS)    Ruptured lumbar disc    Syncope    negative cardiac w/u per Dr VaIrish Lack Tick bite 2011   likely tick associated illness, treated with doxy with resolution,   Viral exanthem, unspecified     Patient Active Problem List   Diagnosis Date Noted   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   [place]state cancer ([place] 11/21/2019   Other social stressor 07/19/2019   S/P shoulder replacement, 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   Oxygen desaturation during sleep 06/21/2017   Health care maintenance 12/19/2016   Anxiety 08/24/2016   Dry skin 03/13/2016   CAD (coronary artery disease) 08/09/2015   Abnormal stress test    OA (osteoarthritis) of hip 05/02/2015   Muscle cramps 11/20/2014   RLS (restless legs syndrome) 06/30/2014   Advance care planning 11/04/2013   Insomnia 11/16/2011   Medicare annual wellness visit, subsequent 09/17/2011   GERD 09/25/2009   HLD (hyperlipidemia) 09/06/2009   Essential hypertension 09/06/2009   Osteoarthritis of multiple joints 09/06/2009   BPH (benign prostatic hyperplasia) 09/06/2009    Past Surgical History:  Procedure Laterality Date   ACHILLES TENDON REPAIR Left 1984   CARDIAC CATHETERIZATION N/A 08/08/2015   Procedure: Left Heart Cath and Coronary Angiography;  Surgeon: MaJerline PainMD;  [facility];  Service: Cardiovascular;  Laterality: N/A;   CARDIAC CATHETERIZATION N/A 08/08/2015   Procedure: Coronary Stent Intervention;  Surgeon: JaJettie BoozeMD;  [facility];  Service: Cardiovascular;  Laterality: N/A;   COLONOSCOPY  2018   CORONARY STENT PLACEMENT  08/08/2015   MID LAD WITH  DES   JOINT REPLACEMENT     SHOULDER ARTHROSCOPY  2012   R rotator cuff (biceps tear not repaired)   SKIN CANCER EXCISION     right shoulder and mid chest   TOTAL HIP ARTHROPLASTY     right   TOTAL HIP ARTHROPLASTY Left 05/02/2015   Procedure: TOTAL LEFT HIP ARTHROPLASTY ANTERIOR APPROACH;  Surgeon: Gaynelle Arabian, MD;  Location: [facility];  Service: Orthopedics;  Laterality: Left;   TOTAL SHOULDER ARTHROPLASTY Left 03/18/2018   Procedure: TOTAL SHOULDER ARTHROPLASTY;  Surgeon: Justice Britain, MD;  Location: [facility];  Service: Orthopedics;  Laterality: Left;  183mn       Family History  Problem Relation Age of Onset   Alzheimer's disease Father    Arrhythmia  Mother    Prostate cancer Maternal Uncle    Colon cancer Neg Hx    Heart attack Neg Hx    Hypertension Neg Hx     Social History   Tobacco Use   Smoking status: Former    Packs/day: 1.00    Types: Cigarettes    Quit date: 04/12/1984    Years since quitting: 36.5   Smokeless tobacco: Never  Vaping Use   Vaping Use: Never used  Substance Use Topics   Alcohol use: No    Alcohol/week: 0.0 standard drinks   Drug use: No    Home Medications Prior to Admission medications   Medication Sig Start Date End Date Taking? Authorizing Provider  Alcaftadine 0.25 % SOLN Place 1 drop into both eyes daily as needed (LASTACAFT - itchy eyes).     [provider]  amLODipine (NORVASC) 10 MG tablet TAKE 1 TABLET BY MOUTH  DAILY 05/24/20   DTonia Ghent MD  clopidogrel (PLAVIX) 75 MG tablet TAKE 1 TABLET BY MOUTH  DAILY WITH BREAKFAST 05/29/20   DTonia Ghent MD  diazepam (VALIUM) 5 MG tablet TAKE 1 TO 2 TABLETS BY  MOUTH EVERY 12 HOURS AS  NEEDED FOR ANXIETY AND FOR  INSOMNIA . SEDATION CAUTION 08/12/20   DTonia Ghent MD  Evolocumab (REPATHA SURECLICK) [id number]/ML SOAJ Inject 1 mL into the skin every 14 (fourteen) days. 09/10/20   SJerline Pain MD  Homeopathic Products ([place]  07/16/19   [provider]  hydrocortisone cream 1 % Apply 1 application topically daily as needed for itching.    [provider]  Lidocaine HCl-Benzyl Alcohol (SALONPAS LIDOCAINE PLUS) 4-10 % CREA Apply topically as needed.    [provider]  methocarbamol (ROBAXIN) 500 MG tablet Take 250-500 mg by mouth daily as needed for muscle spasms.    [provider]  nitroGLYCERIN (NITROSTAT) 0.4 MG SL tablet Place 1 tablet (0.4 mg total) under the tongue every 5 (five) minutes as needed for chest pain. 10/25/19   GBurtis Junes NP  ondansetron (ZOFRAN) 4 MG tablet Take 1 tablet (4 mg total) by mouth every 8 (eight) hours as needed for nausea or vomiting. 03/19/18    Shuford, TOlivia Mackie PA-C  OVER THE COUNTER MEDICATION Apply 1 application topically daily as needed (pain - PENETREX Cream).    [provider]  pantoprazole (PROTONIX) 40 MG tablet TAKE 1 TABLET BY MOUTH  DAILY 02/27/20   DTonia Ghent MD  rOPINIRole (REQUIP) 0.5 MG tablet Take 1 tablet (0.5 mg total) by mouth at bedtime. 01/31/20   DTonia Ghent MD  SIMPLY SALINE NA Place 1 spray into both nostrils 2 (two) times daily.  [provider]  traMADol (ULTRAM) 50 MG tablet TAKE 1 TO 2 TABLETS(50 TO 100 MG) BY MOUTH EVERY 8 HOURS AS NEEDED FOR PAIN 09/09/20   Tonia Ghent, MD    Allergies    Xarelto [rivaroxaban], Alprazolam, Benadryl allergy [diphenhydramine hcl], Doxycycline, Haldol [haloperidol], Lactose intolerance (gi), Lipitor [atorvastatin], Melatonin, Morphine and related, Tamsulosin, Tylenol [acetaminophen], Zolpidem tartrate, Crestor [rosuvastatin calcium], and Zetia [ezetimibe]  Review of Systems   Review of Systems  Gastrointestinal:  Positive for abdominal pain, diarrhea and nausea.  All other systems reviewed and are negative.  Physical Exam Updated Vital Signs BP 113/79   Pulse 88   Temp 98.2 F (36.8 C) (Oral)   Resp 18   Ht '5\' 7"'$  (1.702 m)   Wt 75.2 kg   SpO2 93%   BMI 25.97 kg/m   Physical Exam Vitals and nursing note reviewed.  Constitutional:      Comments: Slightly uncomfortable   HENT:     Head: Normocephalic.     Mouth/Throat:     Mouth: Mucous membranes are moist.  Eyes:     Extraocular Movements: Extraocular movements intact.  Cardiovascular:     Rate and Rhythm: Normal rate and regular rhythm.  Pulmonary:     Effort: Pulmonary effort is normal.     Breath sounds: Normal breath sounds.  Abdominal:     Comments: + mild diffuse tenderness   Skin:    General: Skin is warm.     Capillary Refill: Capillary refill takes less than 2 seconds.  Neurological:     General: No focal deficit present.     Mental Status: He is alert  and oriented to person, place, and time.  Psychiatric:        Mood and Affect: Mood normal.    ED Results / Procedures / Treatments   Labs (all labs ordered are listed, but only abnormal results are displayed) Labs Reviewed  COMPREHENSIVE METABOLIC PANEL - Abnormal; Notable for the following components:      Result Value   Sodium 134 (*)    Glucose, Bld 110 (*)    All other components within normal limits  CBC - Abnormal; Notable for the following components:   WBC 16.2 (*)    All other components within normal limits  URINALYSIS, ROUTINE W REFLEX MICROSCOPIC - Abnormal; Notable for the following components:   Ketones, ur 40 (*)    All other components within normal limits  C DIFFICILE QUICK SCREEN W PCR REFLEX    GASTROINTESTINAL PANEL BY PCR, STOOL (REPLACES STOOL CULTURE)  LIPASE, BLOOD    EKG None  Radiology No results found.  Procedures Procedures   Medications Ordered in ED Medications  metroNIDAZOLE (FLAGYL) tablet 500 mg (has no administration in time range)  sodium chloride 0.9 % bolus 1,000 mL (1,000 mLs Intravenous New Bag/Given 10/19/20 1856)  HYDROmorphone (DILAUDID) injection 1 mg (1 mg Intravenous Given 10/19/20 1925)  ondansetron (ZOFRAN) injection 4 mg (4 mg Intravenous Given 10/19/20 1925)  iohexol (OMNIPAQUE) 300 MG/ML solution 100 mL (100 mLs Intravenous Contrast Given 10/19/20 1941)  ciprofloxacin (CIPRO) tablet 500 mg (500 mg Oral Given 10/19/20 2139)    ED Course  I have reviewed the triage vital signs and the nursing notes.  Pertinent labs & imaging results that were available during my care of the patient were reviewed by me and considered in my medical decision making (see chart for details).    MDM Rules/Calculators/A&P  Keith Badgley. is a 75 y.o. male here with abdominal pain and diarrhea.  Consider viral gastroenteritis versus C. difficile versus obstruction.  Will get CBC and CMP and CT abdomen pelvis.   10:05  PM WBC is 16.  CT showed fluid dilated bowels.  Patient has no obvious obstruction.  Patient will be discharged on Cipro and Flagyl.  I order C. difficile and GI pathogen but he did not give a stool sample yet    Final Clinical Impression(s) / ED Diagnoses Final diagnoses:  None    Rx / DC Orders ED Discharge Orders     None        Drenda Freeze, MD 10/19/20 2205

## 2020-10-21 ENCOUNTER — Encounter: Payer: Self-pay | Admitting: Family Medicine

## 2020-10-22 ENCOUNTER — Other Ambulatory Visit: Payer: Self-pay | Admitting: Family Medicine

## 2020-10-22 DIAGNOSIS — D72829 Elevated white blood cell count, unspecified: Secondary | ICD-10-CM

## 2020-10-22 NOTE — Telephone Encounter (Signed)
Please call pt about a lab visit. I put in the orders.  See mychart message.  Thanks.

## 2020-10-23 NOTE — Telephone Encounter (Signed)
Called patient and left message to call back to schedule labs

## 2020-10-23 NOTE — Telephone Encounter (Signed)
I would like to get his labs set up this week if possible.  Thanks.

## 2020-10-25 ENCOUNTER — Encounter: Payer: Self-pay | Admitting: Family Medicine

## 2020-10-26 ENCOUNTER — Other Ambulatory Visit (INDEPENDENT_AMBULATORY_CARE_PROVIDER_SITE_OTHER): Payer: Medicare Other

## 2020-10-26 ENCOUNTER — Other Ambulatory Visit: Payer: Self-pay

## 2020-10-26 DIAGNOSIS — D72829 Elevated white blood cell count, unspecified: Secondary | ICD-10-CM | POA: Diagnosis not present

## 2020-10-26 LAB — CBC WITH DIFFERENTIAL/PLATELET
Basophils Absolute: 0.1 10*3/uL (ref 0.0–0.1)
Basophils Relative: 1.3 % (ref 0.0–3.0)
Eosinophils Absolute: 0.1 10*3/uL (ref 0.0–0.7)
Eosinophils Relative: 1.4 % (ref 0.0–5.0)
HCT: 39.2 % (ref 39.0–52.0)
Hemoglobin: 13 g/dL (ref 13.0–17.0)
Lymphocytes Relative: 34 % (ref 12.0–46.0)
Lymphs Abs: 1.9 10*3/uL (ref 0.7–4.0)
MCHC: 33.2 g/dL (ref 30.0–36.0)
MCV: 87.7 fl (ref 78.0–100.0)
Monocytes Absolute: 0.6 10*3/uL (ref 0.1–1.0)
Monocytes Relative: 11.5 % (ref 3.0–12.0)
Neutro Abs: 2.9 10*3/uL (ref 1.4–7.7)
Neutrophils Relative %: 51.8 % (ref 43.0–77.0)
Platelets: 311 10*3/uL (ref 150.0–400.0)
RBC: 4.47 Mil/uL (ref 4.22–5.81)
RDW: 14.3 % (ref 11.5–15.5)
WBC: 5.7 10*3/uL (ref 4.0–10.5)

## 2020-10-29 ENCOUNTER — Other Ambulatory Visit: Payer: Medicare Other

## 2020-10-29 ENCOUNTER — Encounter: Payer: Self-pay | Admitting: Family Medicine

## 2020-10-29 DIAGNOSIS — D72829 Elevated white blood cell count, unspecified: Secondary | ICD-10-CM

## 2020-10-31 ENCOUNTER — Encounter: Payer: Self-pay | Admitting: Family Medicine

## 2020-10-31 LAB — GASTROINTESTINAL PATHOGEN PANEL PCR
C. difficile Tox A/B, PCR: NOT DETECTED
Campylobacter, PCR: NOT DETECTED
Cryptosporidium, PCR: NOT DETECTED
E coli (ETEC) LT/ST PCR: NOT DETECTED
E coli (STEC) stx1/stx2, PCR: NOT DETECTED
E coli 0157, PCR: NOT DETECTED
Giardia lamblia, PCR: NOT DETECTED
Norovirus, PCR: NOT DETECTED
Rotavirus A, PCR: NOT DETECTED
Salmonella, PCR: NOT DETECTED
Shigella, PCR: NOT DETECTED

## 2020-11-02 ENCOUNTER — Other Ambulatory Visit: Payer: Self-pay | Admitting: Family Medicine

## 2020-11-02 MED ORDER — TRAMADOL HCL 50 MG PO TABS: ORAL_TABLET | ORAL | 0 refills | Status: DC

## 2020-11-30 ENCOUNTER — Encounter: Payer: Self-pay | Admitting: Family Medicine

## 2020-12-02 ENCOUNTER — Other Ambulatory Visit: Payer: Self-pay | Admitting: Family Medicine

## 2020-12-02 MED ORDER — TRAMADOL HCL 50 MG PO TABS: ORAL_TABLET | ORAL | 2 refills | Status: DC

## 2020-12-21 ENCOUNTER — Other Ambulatory Visit: Payer: Self-pay | Admitting: Family Medicine

## 2020-12-25 NOTE — Telephone Encounter (Signed)
lvmtcb

## 2021-01-03 ENCOUNTER — Encounter: Payer: Self-pay | Admitting: Family Medicine

## 2021-01-11 ENCOUNTER — Encounter: Payer: Self-pay | Admitting: Cardiology

## 2021-01-11 ENCOUNTER — Ambulatory Visit (INDEPENDENT_AMBULATORY_CARE_PROVIDER_SITE_OTHER): Payer: Medicare Other | Admitting: Cardiology

## 2021-01-11 ENCOUNTER — Other Ambulatory Visit: Payer: Self-pay

## 2021-01-11 VITALS — BP 114/80 | HR 72 | Ht 67.0 in | Wt 168.0 lb

## 2021-01-11 DIAGNOSIS — E7800 Pure hypercholesterolemia, unspecified: Secondary | ICD-10-CM | POA: Diagnosis not present

## 2021-01-11 DIAGNOSIS — Z01810 Encounter for preprocedural cardiovascular examination: Secondary | ICD-10-CM

## 2021-01-11 DIAGNOSIS — I2510 Atherosclerotic heart disease of native coronary artery without angina pectoris: Secondary | ICD-10-CM

## 2021-01-11 NOTE — Assessment & Plan Note (Signed)
Stable.  No anginal symptoms.  Doing well.  Prior LAD stent June 2017.  Continue with Plavix monotherapy.

## 2021-01-11 NOTE — Patient Instructions (Signed)
Medication Instructions:  The current medical regimen is effective;  continue present plan and medications.  *If you need a refill on your cardiac medications before your next appointment, please call your pharmacy*  Follow-Up: At [facility], you and your health needs are our priority.  As part of our continuing mission to provide you with exceptional heart care, we have created designated Provider Care Teams.  These Care Teams include your primary Cardiologist (physician) and Advanced Practice Providers (APPs -  Physician Assistants and Nurse Practitioners) who all work together to provide you with the care you need, when you need it.  We recommend signing up for the patient portal called "MyChart".  Sign up information is provided on this After Visit Summary.  MyChart is used to connect with patients for Virtual Visits (Telemedicine).  Patients are able to view lab/test results, encounter notes, upcoming appointments, etc.  Non-urgent messages can be sent to your provider as well.   To learn more about what you can do with MyChart, go to [email or web address].    Your next appointment:   1 year(s)  The format for your next appointment:   In Person  Provider:   Mark Skains, MD   Thank you for choosing [facility]!!    

## 2021-01-11 NOTE — Assessment & Plan Note (Signed)
May go ahead and proceed with cataract surgery.  Low risk surgery.  Does not hold any Plavix for the surgery.

## 2021-01-11 NOTE — Assessment & Plan Note (Signed)
Currently on Repatha.  Excellent.  LDL goal less than 70.  At last check 52.  Excellent.  No myalgias.  Doing well with this medication.

## 2021-01-11 NOTE — Progress Notes (Signed)
Cardiology Office Note:    Date:  01/11/2021   ID:  Keith Sell., DOB November 24, 1945, MRN [id number]  PCP:  Tonia Ghent, MD   [facility] Providers Cardiologist:  Candee Furbish, MD     Referring MD: Tonia Ghent, MD    History of Present Illness:    Keith Hallenbeck. is a 75 y.o. male here for follow-up coronary artery disease. LAD DES in June of 2017 following abnormal stress testing. Other issues include HTN, HLD and GERD.    He has had prior issues with paranoia/emotional dysfunction - he has attributed to his cardiac medicines. Now on PCSK9 therapy. Does not tolerate Zetia due to diarrhea.   Pickle ball.  Really enjoys the sport.  He has courts in their fellowship hall indoor excellent.  Dec 7   Past Medical History:  Diagnosis Date   Adjustment reaction with physical symptoms    Anxiety    Cancer, skin, squamous cell    R shoulder, removed per derm 2012 and chest   Coronary artery disease    a. 05/2015 c/p post-op surgery; b. 07/2015 MV EF 53%, basal inferoseptal, mid inferoseptal, and apical inferior mild ischemia;  c. 08/2015 Cath/PCI: LM nl, LAD 85m (3.5x20 Promus DES), 53m, LCX nl, RCA nl, EF 60%.   Diverticulosis 2018   Noted on colonoscopy   Ear ringing    Left   Elevated PSA    h/o with normal bx per URO   GERD (gastroesophageal reflux disease)    Hearing loss    Slight left ear   History of BPH    History of kidney stones    Hydronephrosis    Left, urteral obstruction, 2-3 cm UVJ   Hyperlipidemia    Hypertension    Nephrolithiasis    OA (osteoarthritis)    with r hip replacement   Osteoarthrosis involving, or with mention of more than one site, but not specified as generalized, site unspecified(715.80)    Prostate cancer (HCC)    Restless legs syndrome (RLS)    Ruptured lumbar disc    Syncope    negative cardiac w/u per Dr Irish Lack   Tick bite 2011   likely tick associated illness, treated with doxy with resolution,   Viral exanthem,  unspecified     Past Surgical History:  Procedure Laterality Date   ACHILLES TENDON REPAIR Left 1984   CARDIAC CATHETERIZATION N/A 08/08/2015   Procedure: Left Heart Cath and Coronary Angiography;  Surgeon: Jerline Pain, MD;  Location: [place] [facility];  Service: Cardiovascular;  Laterality: N/A;   CARDIAC CATHETERIZATION N/A 08/08/2015   Procedure: Coronary Stent Intervention;  Surgeon: Jettie Booze, MD;  Location: [place] [facility];  Service: Cardiovascular;  Laterality: N/A;   COLONOSCOPY  2018   CORONARY STENT PLACEMENT  08/08/2015   MID LAD WITH DES   JOINT REPLACEMENT     SHOULDER ARTHROSCOPY  2012   R rotator cuff (biceps tear not repaired)   SKIN CANCER EXCISION     right shoulder and mid chest   TOTAL HIP ARTHROPLASTY     right   TOTAL HIP ARTHROPLASTY Left 05/02/2015   Procedure: TOTAL LEFT HIP ARTHROPLASTY ANTERIOR APPROACH;  Surgeon: Gaynelle Arabian, MD;  Location: [facility];  Service: Orthopedics;  Laterality: Left;   TOTAL SHOULDER ARTHROPLASTY Left 03/18/2018   Procedure: TOTAL SHOULDER ARTHROPLASTY;  Surgeon: Justice Britain, MD;  Location: [facility];  Service: Orthopedics;  Laterality: Left;  177min  Current Medications: Current Meds  Medication Sig   Alcaftadine 0.25 % SOLN Place 1 drop into both eyes daily as needed (LASTACAFT - itchy eyes).    amLODipine (NORVASC) 10 MG tablet TAKE 1 TABLET BY MOUTH  DAILY   ciprofloxacin (CIPRO) 500 MG tablet Take 1 tablet (500 mg total) by mouth 2 (two) times daily. One po bid x 7 days   clopidogrel (PLAVIX) 75 MG tablet TAKE 1 TABLET BY MOUTH  DAILY WITH BREAKFAST   diazepam (VALIUM) 5 MG tablet TAKE 1 TO 2 TABLETS BY  MOUTH EVERY 12 HOURS AS  NEEDED FOR ANXIETY AND FOR  INSOMNIA . SEDATION CAUTION   Evolocumab (REPATHA SURECLICK) 914 MG/ML SOAJ Inject 1 mL into the skin every 14 (fourteen) days.   Homeopathic Products (THERAWORX RELIEF) FOAM    hydrocortisone cream 1 % Apply 1 application topically daily as needed for itching.    Lidocaine HCl-Benzyl Alcohol (SALONPAS LIDOCAINE PLUS) 4-10 % CREA Apply topically as needed.   methocarbamol (ROBAXIN) 500 MG tablet Take 250-500 mg by mouth daily as needed for muscle spasms.   nitroGLYCERIN (NITROSTAT) 0.4 MG SL tablet Place 1 tablet (0.4 mg total) under the tongue every 5 (five) minutes as needed for chest pain.   ondansetron (ZOFRAN ODT) 4 MG disintegrating tablet 4mg  ODT q4 hours prn nausea/vomit   ondansetron (ZOFRAN) 4 MG tablet Take 1 tablet (4 mg total) by mouth every 8 (eight) hours as needed for nausea or vomiting.   OVER THE COUNTER MEDICATION Apply 1 application topically daily as needed (pain - PENETREX Cream).   pantoprazole (PROTONIX) 40 MG tablet TAKE 1 TABLET BY MOUTH  DAILY   rOPINIRole (REQUIP) 0.5 MG tablet TAKE 1 TABLET BY MOUTH AT  BEDTIME   SIMPLY SALINE NA Place 1 spray into both nostrils 2 (two) times daily.    traMADol (ULTRAM) 50 MG tablet TAKE 1 TO 2 TABLETS(50 TO 100 MG) BY MOUTH EVERY 8 HOURS AS NEEDED FOR PAIN     Allergies:   Xarelto [rivaroxaban], Alprazolam, Benadryl allergy [diphenhydramine hcl], Doxycycline, Haldol [haloperidol], Lactose intolerance (gi), Lipitor [atorvastatin], Melatonin, Morphine and related, Tamsulosin, Zolpidem tartrate, Crestor [rosuvastatin calcium], and Zetia [ezetimibe]   Social History   Socioeconomic History   Marital status: Married    Spouse name: Not on file   Number of children: Not on file   Years of education: Not on file   Highest education level: Not on file  Occupational History   Occupation: Lobbyist: [employer]  Tobacco Use   Smoking status: Former    Packs/day: 1.00    Types: Cigarettes    Quit date: 04/12/1984    Years since quitting: 36.7   Smokeless tobacco: Never  Vaping Use   Vaping Use: Never used  Substance and Sexual Activity   Alcohol use: No    Alcohol/week: 0.0 standard drinks   Drug use: No   Sexual activity: Yes  Other Topics Concern   Not on  file  Social History Narrative   Went to [place], previous [employer] service ('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     Retired.     His youngest daughter is dead.   Social Determinants of Health   Financial Resource Strain: Not on file  Food  Insecurity: Not on file  Transportation Needs: Not on file  Physical Activity: Not on file  Stress: Not on file  Social Connections: Not on file     Family History: The patient's family history includes Alzheimer's disease in his father; Arrhythmia in his mother; Prostate cancer in his maternal uncle. There is no history of Colon cancer, Heart attack, or Hypertension.  ROS:   Please see the history of present illness.     All other systems reviewed and are negative.  EKGs/Labs/Other Studies Reviewed:    The following studies were reviewed today: Left Heart Cath and Coronary Angiography 08/2015  Conclusion   Mid LAD lesion, 85% stenosed. 20% lesion approximate 20 mm past focal mid LAD lesion as described above. Normal left ventricular function, EF 60% no wall motion abnormality No aortic stenosis Normal appearing coronary arteries otherwise.   Plan is to have evaluation by Dr. Irish Lack. He had chest discomfort postoperatively hip replacement, mid LAD lesion likely culprit. Nuclear stress test abnormal.    Candee Furbish, MD   Coronary Stent Intervention 08/2015  Conclusion   Mid LAD-1 lesion, 90% stenosed. Post intervention with a 3.5 x 20 Promus drug-eluting stent, postdilated to 3.6 mm, there is a 0% residual stenosis.   Continue dual antiplatelet therapy for at least a year. At that point, consider stopping aspirin. Continue aggressive secondary prevention. He'll be watched overnight. Possible discharge tomorrow.   EKG:  EKG is  ordered today.  The ekg ordered today  demonstrates sinus rhythm 72 no other abnormalities  Recent Labs: 10/19/2020: ALT 18; BUN 13; Creatinine, Ser 1.11; Potassium 3.7; Sodium 134 10/26/2020: Hemoglobin 13.0; Platelets 311.0  Recent Lipid Panel    Component Value Date/Time   CHOL 125 02/27/2020 0931   TRIG 122.0 02/27/2020 0931   HDL 49.40 02/27/2020 0931   CHOLHDL 3 02/27/2020 0931   VLDL 24.4 02/27/2020 0931   LDLCALC 52 02/27/2020 0931   LDLDIRECT 135.6 09/10/2011 0835     Risk Assessment/Calculations:         Physical Exam:    VS:  BP 114/80 (BP Location: Left Arm, Patient Position: Sitting, Cuff Size: Normal)   Pulse 72   Ht 5\' 7"  (1.702 m)   Wt 168 lb (76.2 kg)   BMI 26.31 kg/m     Wt Readings from Last 3 Encounters:  01/11/21 168 lb (76.2 kg)  10/19/20 165 lb 12.6 oz (75.2 kg)  03/05/20 165 lb 11.2 oz (75.2 kg)     GEN:  Well nourished, well developed in no acute distress HEENT: Normal NECK: No JVD; No carotid bruits LYMPHATICS: No lymphadenopathy CARDIAC: RRR, no murmurs, rubs, gallops RESPIRATORY:  Clear to auscultation without rales, wheezing or rhonchi  ABDOMEN: Soft, non-tender, non-distended MUSCULOSKELETAL:  No edema; No deformity  SKIN: Warm and dry NEUROLOGIC:  Alert and oriented x 3 PSYCHIATRIC:  Normal affect   ASSESSMENT:    1. Coronary artery disease involving native coronary artery of native heart without angina pectoris   2. Pure hypercholesterolemia   3. Preop cardiovascular exam    PLAN:    In order of problems listed above:  HLD (hyperlipidemia) Currently on Repatha.  Excellent.  LDL goal less than 70.  At last check 52.  Excellent.  No myalgias.  Doing well with this medication.  Preop cardiovascular exam May go ahead and proceed with cataract surgery.  Low risk surgery.  Does not hold any Plavix for the surgery.  CAD (coronary artery disease) Stable.  No anginal symptoms.  Doing well.  Prior LAD stent June 2017.  Continue with Plavix monotherapy.          Medication Adjustments/Labs and Tests Ordered: Current medicines are reviewed at length with the patient today.  Concerns regarding medicines are outlined above.  Orders Placed This Encounter  Procedures   EKG 12-Lead    No orders of the defined types were placed in this encounter.   Patient Instructions  Medication Instructions:  The current medical regimen is effective;  continue present plan and medications.  *If you need a refill on your cardiac medications before your next appointment, please call your pharmacy*  Follow-Up: At [facility], you and your health needs are our priority.  As part of our continuing mission to provide you with exceptional heart care, we have created designated Provider Care Teams.  These Care Teams include your primary Cardiologist (physician) and Advanced Practice Providers (APPs -  Physician Assistants and Nurse Practitioners) who all work together to provide you with the care you need, when you need it.  We recommend signing up for the patient portal called "MyChart".  Sign up information is provided on this After Visit Summary.  MyChart is used to connect with patients for Virtual Visits (Telemedicine).  Patients are able to view lab/test results, encounter notes, upcoming appointments, etc.  Non-urgent messages can be sent to your provider as well.   To learn more about what you can do with MyChart, go to [email or web address].    Your next appointment:   1 year(s)  The format for your next appointment:   In Person  Provider:   Candee Furbish, MD {   Thank you for choosing [facility]!!     Signed, Candee Furbish, MD  01/11/2021 4:42 PM    [place]

## 2021-02-20 ENCOUNTER — Other Ambulatory Visit: Payer: Self-pay | Admitting: Family Medicine

## 2021-02-25 ENCOUNTER — Other Ambulatory Visit: Payer: Self-pay

## 2021-02-25 ENCOUNTER — Encounter: Payer: Self-pay | Admitting: Family Medicine

## 2021-02-27 ENCOUNTER — Other Ambulatory Visit: Payer: Self-pay | Admitting: Family Medicine

## 2021-02-27 ENCOUNTER — Encounter: Payer: Self-pay | Admitting: Family Medicine

## 2021-02-27 MED ORDER — TRAMADOL HCL 50 MG PO TABS: ORAL_TABLET | ORAL | 2 refills | Status: DC

## 2021-03-07 ENCOUNTER — Encounter: Payer: Medicare Other | Admitting: Family Medicine

## 2021-03-11 ENCOUNTER — Other Ambulatory Visit: Payer: Self-pay | Admitting: Cardiology

## 2021-03-11 DIAGNOSIS — E785 Hyperlipidemia, unspecified: Secondary | ICD-10-CM

## 2021-03-11 DIAGNOSIS — I2510 Atherosclerotic heart disease of native coronary artery without angina pectoris: Secondary | ICD-10-CM

## 2021-03-12 ENCOUNTER — Other Ambulatory Visit: Payer: Self-pay | Admitting: Family Medicine

## 2021-03-13 NOTE — Telephone Encounter (Signed)
Refill request for requip 0.5 mg tablet  LOV - 03/05/20 Next OV - 03/15/20 Last refill - 12/25/20 #90/0

## 2021-03-15 ENCOUNTER — Other Ambulatory Visit: Payer: Self-pay

## 2021-03-15 ENCOUNTER — Ambulatory Visit (INDEPENDENT_AMBULATORY_CARE_PROVIDER_SITE_OTHER): Payer: Medicare Other | Admitting: Family Medicine

## 2021-03-15 ENCOUNTER — Encounter: Payer: Self-pay | Admitting: Family Medicine

## 2021-03-15 VITALS — BP 120/82 | HR 72 | Temp 98.2°F | Ht 67.0 in | Wt 169.0 lb

## 2021-03-15 DIAGNOSIS — M158 Other polyosteoarthritis: Secondary | ICD-10-CM

## 2021-03-15 DIAGNOSIS — Z0000 Encounter for general adult medical examination without abnormal findings: Secondary | ICD-10-CM | POA: Diagnosis not present

## 2021-03-15 DIAGNOSIS — I10 Essential (primary) hypertension: Secondary | ICD-10-CM | POA: Diagnosis not present

## 2021-03-15 DIAGNOSIS — Z7189 Other specified counseling: Secondary | ICD-10-CM

## 2021-03-15 DIAGNOSIS — E785 Hyperlipidemia, unspecified: Secondary | ICD-10-CM

## 2021-03-15 DIAGNOSIS — G2581 Restless legs syndrome: Secondary | ICD-10-CM

## 2021-03-15 DIAGNOSIS — G4700 Insomnia, unspecified: Secondary | ICD-10-CM

## 2021-03-15 LAB — CBC WITH DIFFERENTIAL/PLATELET
Basophils Absolute: 0.1 10*3/uL (ref 0.0–0.1)
Basophils Relative: 1 % (ref 0.0–3.0)
Eosinophils Absolute: 0.1 10*3/uL (ref 0.0–0.7)
Eosinophils Relative: 1.7 % (ref 0.0–5.0)
HCT: 41.4 % (ref 39.0–52.0)
Hemoglobin: 13.5 g/dL (ref 13.0–17.0)
Lymphocytes Relative: 40.2 % (ref 12.0–46.0)
Lymphs Abs: 2.2 10*3/uL (ref 0.7–4.0)
MCHC: 32.7 g/dL (ref 30.0–36.0)
MCV: 88.8 fl (ref 78.0–100.0)
Monocytes Absolute: 0.5 10*3/uL (ref 0.1–1.0)
Monocytes Relative: 9.5 % (ref 3.0–12.0)
Neutro Abs: 2.6 10*3/uL (ref 1.4–7.7)
Neutrophils Relative %: 47.6 % (ref 43.0–77.0)
Platelets: 304 10*3/uL (ref 150.0–400.0)
RBC: 4.67 Mil/uL (ref 4.22–5.81)
RDW: 13.9 % (ref 11.5–15.5)
WBC: 5.5 10*3/uL (ref 4.0–10.5)

## 2021-03-15 LAB — COMPREHENSIVE METABOLIC PANEL
ALT: 12 U/L (ref 0–53)
AST: 18 U/L (ref 0–37)
Albumin: 4.3 g/dL (ref 3.5–5.2)
Alkaline Phosphatase: 72 U/L (ref 39–117)
BUN: 7 mg/dL (ref 6–23)
CO2: 30 mEq/L (ref 19–32)
Calcium: 8.9 mg/dL (ref 8.4–10.5)
Chloride: 100 mEq/L (ref 96–112)
Creatinine, Ser: 0.81 mg/dL (ref 0.40–1.50)
GFR: 86.1 mL/min (ref >60.00)
Glucose, Bld: 86 mg/dL (ref 70–99)
Potassium: 3.8 mEq/L (ref 3.5–5.1)
Sodium: 140 mEq/L (ref 135–145)
Total Bilirubin: 0.4 mg/dL (ref 0.2–1.2)
Total Protein: 7 g/dL (ref 6.0–8.3)

## 2021-03-15 LAB — LIPID PANEL
Cholesterol: 105 mg/dL (ref 0–200)
HDL: 50.4 mg/dL (ref >39.00)
LDL Cholesterol: 35 mg/dL (ref 0–99)
NonHDL: 54.33
Total CHOL/HDL Ratio: 2
Triglycerides: 96 mg/dL (ref 0.0–149.0)
VLDL: 19.2 mg/dL (ref 0.0–40.0)

## 2021-03-15 MED ORDER — AMLODIPINE BESYLATE 10 MG PO TABS: 10.0000 mg | ORAL_TABLET | Freq: Every day | ORAL | 3 refills | Status: DC

## 2021-03-15 MED ORDER — CLOPIDOGREL BISULFATE 75 MG PO TABS: 75.0000 mg | ORAL_TABLET | Freq: Every day | ORAL | 3 refills | Status: DC

## 2021-03-15 MED ORDER — PANTOPRAZOLE SODIUM 40 MG PO TBEC: 40.0000 mg | DELAYED_RELEASE_TABLET | Freq: Every day | ORAL | 3 refills | Status: DC

## 2021-03-15 NOTE — Patient Instructions (Addendum)
Go to the lab on the way out.   If you have mychart we'll likely use that to update you.    Take care.  Glad to see you.  If you have worsening lightheadedness, then cut the amlodipine in half.   Update me as needed.

## 2021-03-15 NOTE — Progress Notes (Signed)
This visit occurred during the SARS-CoV-2 public health emergency.  Safety protocols were in place, including screening questions prior to the visit, additional usage of staff PPE, and extensive cleaning of exam room while observing appropriate contact time as indicated for disinfecting solutions.  I have personally reviewed the Medicare Annual Wellness questionnaire and have noted 1. The patient's medical and social history 2. Their use of alcohol, tobacco or illicit drugs 3. Their current medications and supplements 4. The patient's functional ability including ADL's, fall risks, home safety risks and hearing or visual             impairment. 5. Diet and physical activities 6. Evidence for depression or mood disorders  The patients weight, height, BMI have been recorded in the chart and visual acuity is per eye clinic.  I have made referrals, counseling and provided education to the patient based review of the above and I have provided the pt with a written personalized care plan for preventive services.  Provider list updated- see scanned forms.  Routine anticipatory guidance given to patient.  See health maintenance. The possibility exists that previously documented standard health maintenance information may have been brought forward from a previous encounter into this note.  If needed, that same information has been updated to reflect the current situation based on today's encounter.    Flu 2022 Shingles previously done PNA previously done Tetanus 2018 COVID previously done Colon cancer screening 2018 Prostate cancer screening- per urology.   Advance directive- wife designated if patient were incapacitated Cognitive function addressed- see scanned forms- and if abnormal then additional documentation follows.   In addition to [facility] Wellness, follow up visit for the below conditions:  Prev ER eval- enteritis.  He was trying to avoid trigger foods.  No sx now.   Elevated  Cholesterol: Using medications without problems:he noted looser stools after a dose but o/w  tolerating well.   Muscle aches: no diffuse aches.  Diet compliance: yes Exercise: yes Still on repatha.    He noted more gas recently, d/w pt about diet changes.  Diet and exercise d/w pt.  He used gas-x recently.  This should be okay to continue, discussed.  Hypertension:    Using medication without problems or lightheadedness: occ mildly lightheaded, brief.   Chest pain with exertion: yes Edema:no Short of breath:no Labs pending.   RLS.  Still on requip.  It helps.  No sx on med.  No ADE on med.    Taking tramadol for pain at baseline. It helps his shoulder pain a lot.   Diazepam used prn, no ADE on med.  Not used every night.  Anxiety d/w pt.  He has had mult friends who died recently.  Discussed.    He saw dental clinic and is going to get invisiline.    PMH and SH reviewed  Meds, vitals, and allergies reviewed.   ROS: Per HPI.  Unless specifically indicated otherwise in HPI, the patient denies:  General: fever. Eyes: acute vision changes ENT: sore throat Cardiovascular: chest pain Respiratory: SOB GI: vomiting GU: dysuria Musculoskeletal: acute back pain Derm: acute rash Neuro: acute motor dysfunction Psych: worsening mood Endocrine: polydipsia Heme: bleeding Allergy: hayfever  GEN: nad, alert and oriented HEENT: ncat NECK: supple w/o LA CV: rrr. PULM: ctab, no inc wob ABD: soft, +bs EXT: no edema SKIN: no acute rash

## 2021-03-17 NOTE — Assessment & Plan Note (Signed)
Wife designated if patient were incapacitated.  

## 2021-03-17 NOTE — Assessment & Plan Note (Signed)
Continue Requip.  He will update me as needed.

## 2021-03-17 NOTE — Assessment & Plan Note (Signed)
Continue amlodipine.  Continue work on diet and exercise. 

## 2021-03-17 NOTE — Assessment & Plan Note (Addendum)
Diazepam used prn, no ADE on med.  Not used every night.  Anxiety d/w pt.  He has had mult friends who died recently.  Condolences offered.  Discussed.  He will update me as needed. No SI/HI.

## 2021-03-17 NOTE — Assessment & Plan Note (Signed)
Continue Repatha.  See notes on labs.  Continue work on diet and exercise.

## 2021-03-17 NOTE — Assessment & Plan Note (Signed)
Continue tramadol use.  It helps her shoulder pain a lot.  No adverse effect on medication.  Not sedated.  Still okay for outpatient follow-up.

## 2021-03-17 NOTE — Assessment & Plan Note (Signed)
Flu 2022 Shingles previously done PNA previously done Tetanus 2018 COVID previously done Colon cancer screening 2018 Prostate cancer screening- per urology.   Advance directive- wife designated if patient were incapacitated Cognitive function addressed- see scanned forms- and if abnormal then additional documentation follows.

## 2021-03-18 ENCOUNTER — Encounter: Payer: Self-pay | Admitting: Family Medicine

## 2021-03-19 MED ORDER — ROPINIROLE HCL 0.5 MG PO TABS: 0.5000 mg | ORAL_TABLET | Freq: Every day | ORAL | 0 refills | Status: DC

## 2021-05-04 IMAGING — MR MR PROSTATE WO/W CM
15 series · 48 of 48 positions shown · IV contrast (gadavist)
Comparison: None.

CLINICAL DATA: Elevated prostate specific antigen.  74-year-old.

EXAM:
MR PROSTATE WITHOUT AND WITH CONTRAST
TECHNIQUE: Multiplanar multisequence MRI images were obtained of the pelvis
centered about the prostate. Pre and post contrast images were
obtained.
CONTRAST:  8mL GADAVIST GADOBUTROL 1 MMOL/ML IV SOLN

[Series 3: T1 · axial · 6.0mm · 0.59mm/px · 1 of 31 slices shown (1 of 2)]
[im 1/31]
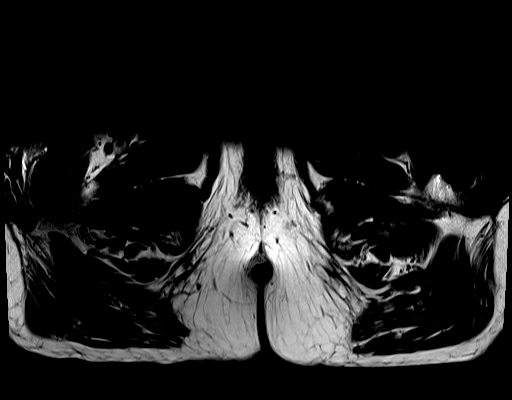

[Series 4: axial whole pelvis · axial · 6.0mm · 0.59mm/px · 1 of 30 slices shown]
[im 1/30]
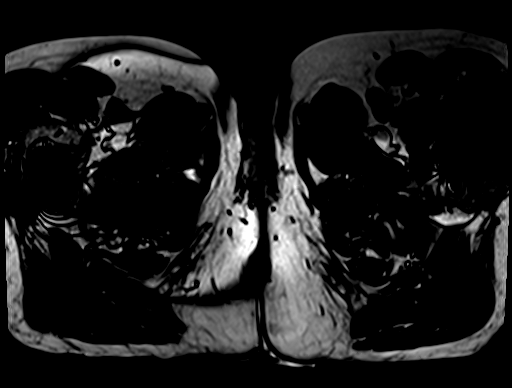

[Series 5: T2 · axial · 3.0mm · 0.47mm/px · 1 of 25 slices shown (1 of 4)]
[im 1/25]
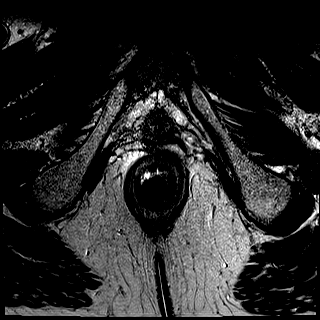

[Series 6: T2 · coronal · 4.0mm · 0.47mm/px · 1 of 19 slices shown (2 of 4)]
[im 1/19]
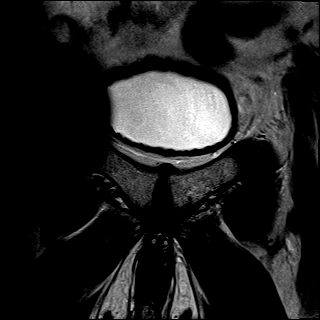

[Series 7: T2 · sagittal · 3.5mm · 0.47mm/px · 1 of 22 slices shown (3 of 4)]
[im 1/22]
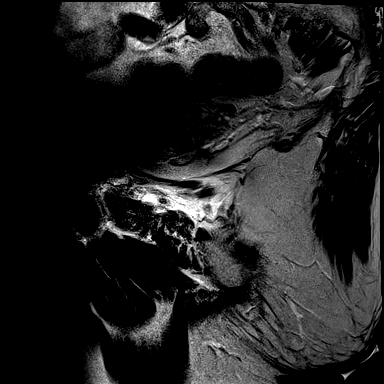

[Series 8: T1 · axial · 3.0mm · 0.50mm/px · 1 of 23 slices shown (2 of 2)]
[im 1/23]
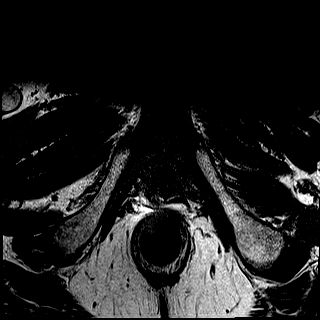

[Series 10: ep2d_diff_b50_400_800_tra_endo**_tracew_dfc_mix · axial · 3.0mm · 1.60mm/px · 1 of 63 slices shown (1 of 2)]
[im 1/63]
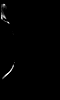

[Series 11: ep2d_diff_b50_400_800_tra_endo**_adc_dfc_mix · axial · 3.0mm · 1.60mm/px · 1 of 23 slices shown (1 of 2)]
[im 1/23]
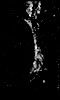

[Series 12: ep2d_diff_b50_400_800_tra_endo**_calc_bval_dfc_mix · axial · 3.0mm · 1.60mm/px · 1 of 18 slices shown (1 of 2)]
[im 1/18]
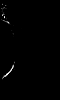

[Series 13: T2 · axial · 1.2mm · 0.90mm/px · 1 of 64 slices shown (4 of 4)]
[im 1/64]
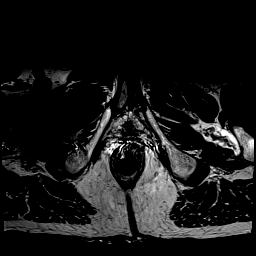

[Series 14: ep2d_diff_b50_400_800_tra_endo**_tracew_dfc_mix · axial · 3.0mm · 1.60mm/px · z∈[-45,+32]mm · 2 of 64 slices shown (2 of 2)]
[im 1/64]
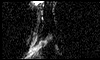
[im 64/64]
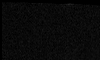

[Series 15: ep2d_diff_b50_400_800_tra_endo**_adc_dfc_mix · axial · 3.0mm · 1.60mm/px · 1 of 23 slices shown (2 of 2)]
[im 1/23]
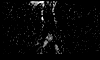

[Series 16: ep2d_diff_b50_400_800_tra_endo**_calc_bval_dfc_mix · axial · 3.0mm · 1.60mm/px · 1 of 17 slices shown (2 of 2)]
[im 1/17]
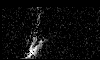

[Series 17: t1_vibe_tra_dyn · axial · 3.0mm · 0.81mm/px · z∈[-27,+54]mm · 18 of 559 slices shown]
[im 1/559]
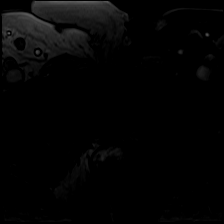
[im 33/559]
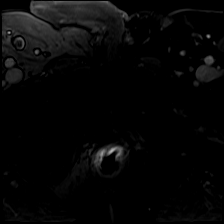
[im 66/559]
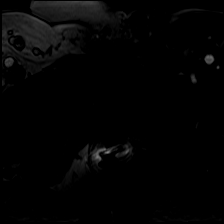
[im 99/559]
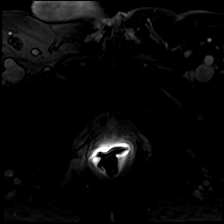
[im 132/559]
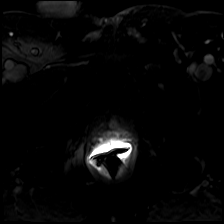
[im 165/559]
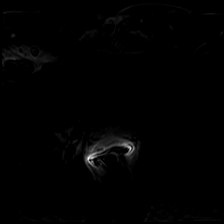
[im 197/559]
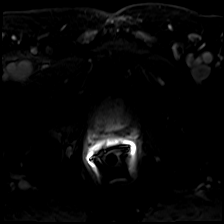
[im 230/559]
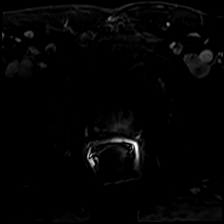
[im 263/559]
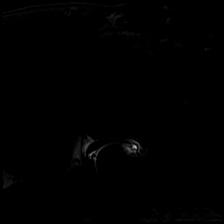
[im 296/559]
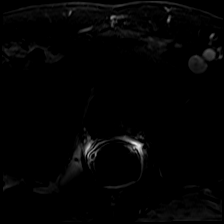
[im 329/559]
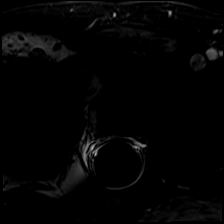
[im 362/559]
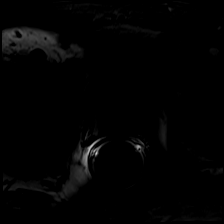
[im 394/559]
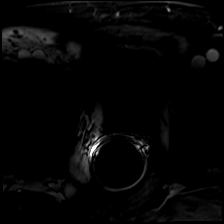
[im 427/559]
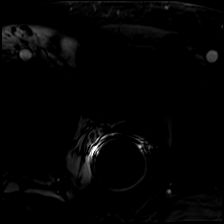
[im 460/559]
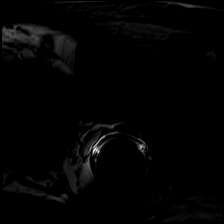
[im 493/559]
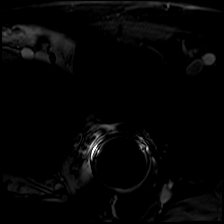
[im 526/559]
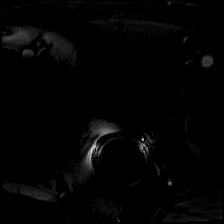
[im 559/559]
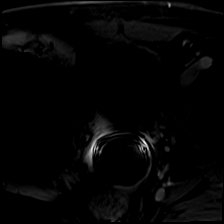

[Series 18: t1_vibe_tra_dyn_sub · axial · 3.0mm · 0.81mm/px · z∈[-27,+54]mm · 16 of 513 slices shown]
[im 1/513]
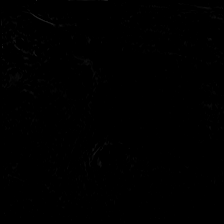
[im 35/513]
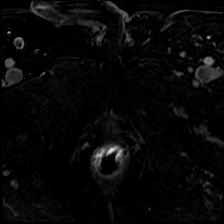
[im 69/513]
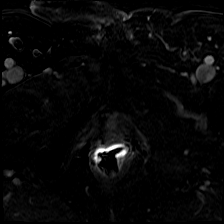
[im 103/513]
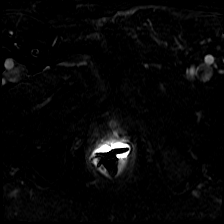
[im 137/513]
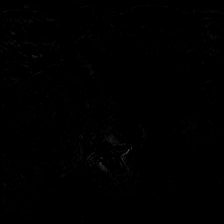
[im 171/513]
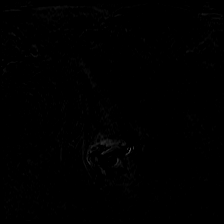
[im 205/513]
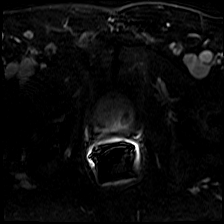
[im 239/513]
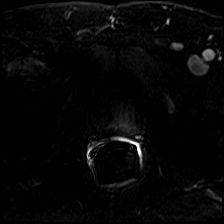
[im 274/513]
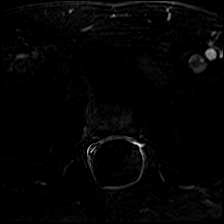
[im 308/513]
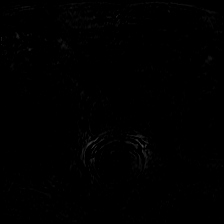
[im 342/513]
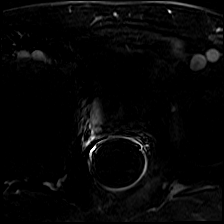
[im 376/513]
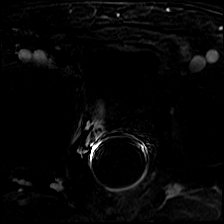
[im 410/513]
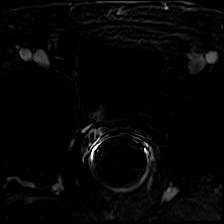
[im 444/513]
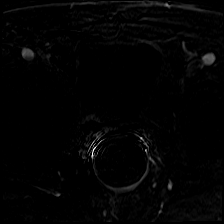
[im 478/513]
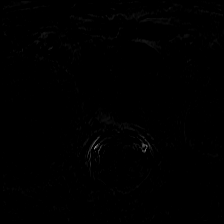
[im 513/513]
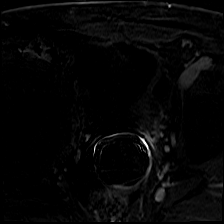

[48 of 48 positions shown; findings below may reference images not displayed]

FINDINGS: The diffusion-weighted imaging are on not evaluable secondary to
artifact from bilateral hip prosthetics. The postcontrast T1
weighted images are also severely degraded. The only reliable
sequences are the T2 weighted series.

Prostate: On T2 weighted imaging, there is a focus of loss of signal
intensity within the RIGHT mid gland measuring 5 mm by 5 mm (image
37/3). No additional loss of signal intensity within the peripheral
zone.

The transitional zone is enlarged by multiple well capsulated
nodules without abnormal signal intensity.

Volume: 4.7 by 4.0 by 4.3 cm (volume = 42 cm^3)

Transcapsular spread:  Absent

Seminal vesicle involvement: Absent

Neurovascular bundle involvement: Absent

Pelvic adenopathy: Absent

Bone metastasis: Absent. Severe limitation from bilateral hip
prosthetics.

Other findings: Several bladder diverticula on the LEFT aspect of
the bladder.
IMPRESSION: 1. Exam is severely degraded by bilateral hip prosthetics which only
allows interpretation of the T2 weighted imaging.
2. Focal lesion within the RIGHT mid gland on T2 weighted imaging is
concerning for prostate carcinoma. PI-RADS: 3. Cannot assign a
higher grade without diffusion imaging available. (Dynacad 3D post
processing performed). OLE KRISTIAN #1.
3. Enlarged nodular transitional zone most consistent benign
prostate hypertrophy. PI-RADS: 2
4. Incidental finding of bladder diverticula.

## 2021-05-27 ENCOUNTER — Encounter: Payer: Self-pay | Admitting: Family Medicine

## 2021-05-29 ENCOUNTER — Other Ambulatory Visit: Payer: Self-pay | Admitting: Family Medicine

## 2021-05-29 MED ORDER — TRAMADOL HCL 50 MG PO TABS: ORAL_TABLET | ORAL | 2 refills | Status: DC

## 2021-08-12 ENCOUNTER — Encounter: Payer: Self-pay | Admitting: Family Medicine

## 2021-08-22 ENCOUNTER — Encounter: Payer: Self-pay | Admitting: Family Medicine

## 2021-08-25 MED ORDER — TRAMADOL HCL 50 MG PO TABS: ORAL_TABLET | ORAL | 0 refills | Status: DC

## 2021-09-29 ENCOUNTER — Encounter: Payer: Self-pay | Admitting: Family Medicine

## 2021-09-30 NOTE — Telephone Encounter (Signed)
LOV - 03/15/21 NOV - not scheduled RF- 08/25/21 #180/0

## 2021-10-01 ENCOUNTER — Encounter: Payer: Self-pay | Admitting: Family Medicine

## 2021-10-02 ENCOUNTER — Other Ambulatory Visit: Payer: Self-pay | Admitting: Family Medicine

## 2021-10-02 MED ORDER — TRAMADOL HCL 50 MG PO TABS: ORAL_TABLET | ORAL | 1 refills | Status: DC

## 2021-11-17 ENCOUNTER — Encounter (HOSPITAL_COMMUNITY): Payer: Self-pay | Admitting: *Deleted

## 2021-11-17 ENCOUNTER — Other Ambulatory Visit: Payer: Self-pay

## 2021-11-17 ENCOUNTER — Encounter: Payer: Self-pay | Admitting: Family Medicine

## 2021-11-17 ENCOUNTER — Ambulatory Visit (HOSPITAL_COMMUNITY)
Admission: EM | Admit: 2021-11-17 | Discharge: 2021-11-17 | Disposition: A | Discharge status: Home / Self Care | Payer: Medicare Other | Attending: Emergency Medicine | Admitting: Emergency Medicine

## 2021-11-17 DIAGNOSIS — U071 COVID-19: Secondary | ICD-10-CM | POA: Diagnosis not present

## 2021-11-17 MED ORDER — FLUTICASONE PROPIONATE 50 MCG/ACT NA SUSP: 1.0000 | Freq: Two times a day (BID) | NASAL | 0 refills | Status: DC | PRN

## 2021-11-17 MED ORDER — MOLNUPIRAVIR EUA 200MG CAPSULE: 4.0000 | ORAL_CAPSULE | Freq: Two times a day (BID) | ORAL | 0 refills | Status: AC

## 2021-11-17 NOTE — ED Triage Notes (Signed)
PT tested positive for COVID at home today. Pt temp 102 Pt took Tylenol approx 1 1/2 ago. Pt also has had facial pain and chills.

## 2021-11-17 NOTE — Discharge Instructions (Addendum)
COVID-19 is a virus and will progress through your body with time, it may take up to 10 days before you truly start to feel better, first current CDC guidelines you will need to quarantine for 5 days and return to normal activity on Thursday, if still having symptoms on Thursday please wear a mask  Begin antiviral medication, take 4 tablets every morning and every evening for the next 5 days, this medicine is ideally reducing the amount of virus within your body which in turn will reduce your symptoms and the timeline that you are sick however it is not guaranteed to fully take the illness away  Begin use of Flonase every morning and every evening to help reduce swelling within the sinuses and to also clear congestion  You may use any additional medications to assist with minimizing your symptoms, suggestions are listed below    You can take Tylenol and/or Ibuprofen as needed for fever reduction and pain relief.   For cough: honey 1/2 to 1 teaspoon (you can dilute the honey in water or another fluid).  You can also use guaifenesin and dextromethorphan for cough. You can use a humidifier for chest congestion and cough.  If you don't have a humidifier, you can sit in the bathroom with the hot shower running.      For sore throat: try warm salt water gargles, cepacol lozenges, throat spray, warm tea or water with lemon/honey, popsicles or ice, or OTC cold relief medicine for throat discomfort.   For congestion: take a daily anti-histamine like Zyrtec, Claritin, and a oral decongestant, such as pseudoephedrine.  You can also use Flonase 1-2 sprays in each nostril daily.   It is important to stay hydrated: drink plenty of fluids (water, gatorade/powerade/pedialyte, juices, or teas) to keep your throat moisturized and help further relieve irritation/discomfort.

## 2021-11-17 NOTE — ED Provider Notes (Signed)
[place]    CSN: [id number] Arrival date & time: 11/17/21  1726      History   Chief Complaint Chief Complaint  Patient presents with   COVID    HPI Keith Adamcik. is a 76 y.o. male.   Patient presents with chills, sinus pain and pressure, nasal congestion, and productive cough and a fever peaking at 102 beginning 1 day ago productive, for 1 day.  Endorses that he is feeling much better today.  Known sick contacts who are COVID-positive.  Took a home test this morning and tested positive.  Denies shortness of breath, wheezing, ear pain, sore throat.  Tolerating food and liquids.  History of hypertension, hyperlipidemia, osteoarthritis, prostate cancer, diverticulosis.  Past Medical History:  Diagnosis Date   Adjustment reaction with physical symptoms    Anxiety    Cancer, skin, squamous cell    R shoulder, removed per derm 2012 and chest   Coronary artery disease    a. 05/2015 c/p post-op surgery; b. 07/2015 MV EF 53%, basal inferoseptal, mid inferoseptal, and apical inferior mild ischemia;  c. 08/2015 Cath/PCI: LM nl, LAD 53m(3.5x20 Promus DES), 284mLCX nl, RCA nl, EF 60%.   Diverticulosis 2018   Noted on colonoscopy   Ear ringing    Left   Elevated PSA    h/o with normal bx per URO   GERD (gastroesophageal reflux disease)    Hearing loss    Slight left ear   History of BPH    History of kidney stones    Hydronephrosis    Left, urteral obstruction, 2-3 cm UVJ   Hyperlipidemia    Hypertension    Nephrolithiasis    OA (osteoarthritis)    with r hip replacement   Osteoarthrosis involving, or with mention of more than one site, but not specified as generalized, site unspecified(715.80)    Prostate cancer (HCC)    Restless legs syndrome (RLS)    Ruptured lumbar disc    Syncope    negative cardiac w/u per Dr VaIrish Lack Tick bite 2011   likely tick associated illness, treated with doxy with resolution,   Viral exanthem, unspecified     Patient Active  Problem List   Diagnosis Date Noted   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   [place]   Other social stressor 07/19/2019   S/P shoulder replacement, 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   Oxygen desaturation during sleep 06/21/2017   Anxiety 08/24/2016   Dry skin 03/13/2016   CAD (coronary artery disease) 08/09/2015   Abnormal stress test    OA (osteoarthritis) of hip 05/02/2015   Muscle cramps 11/20/2014   RLS (restless legs syndrome) 06/30/2014   Advance care planning 11/04/2013   Insomnia 11/16/2011   Medicare annual wellness visit, subsequent 09/17/2011   GERD 09/25/2009   HLD (hyperlipidemia) 09/06/2009   Essential hypertension 09/06/2009   Osteoarthritis of multiple joints 09/06/2009   BPH (benign prostatic hyperplasia) 09/06/2009    Past Surgical History:  Procedure Laterality Date   ACHILLES TENDON REPAIR Left 1984   CARDIAC CATHETERIZATION N/A 08/08/2015   Procedure: Left Heart Cath and Coronary Angiography;  Surgeon: MaJerline PainMD;  [facility];  Service: Cardiovascular;  Laterality: N/A;   CARDIAC CATHETERIZATION N/A 08/08/2015   Procedure: Coronary Stent Intervention;  Surgeon: JaJettie BoozeMD;  [facility];  Service: Cardiovascular;  Laterality: N/A;   COLONOSCOPY  2018   CORONARY STENT PLACEMENT  08/08/2015  MID LAD WITH DES   JOINT REPLACEMENT     SHOULDER ARTHROSCOPY  2012   R rotator cuff (biceps tear not repaired)   SKIN CANCER EXCISION     right shoulder and mid chest   TOTAL HIP ARTHROPLASTY     right   TOTAL HIP ARTHROPLASTY Left 05/02/2015   Procedure: TOTAL LEFT HIP ARTHROPLASTY ANTERIOR APPROACH;  Surgeon: Gaynelle Arabian, MD;  Location: [facility];  Service: Orthopedics;  Laterality: Left;   TOTAL SHOULDER ARTHROPLASTY Left 03/18/2018   Procedure: TOTAL SHOULDER ARTHROPLASTY;  Surgeon: Justice Britain, MD;  Location: [facility];  Service: Orthopedics;  Laterality: Left;  176mn       Home Medications    Prior to  Admission medications   Medication Sig Start Date End Date Taking? Authorizing Provider  Alcaftadine 0.25 % SOLN Place 1 drop into both eyes daily as needed (LASTACAFT - itchy eyes).     [provider]  amLODipine (NORVASC) 10 MG tablet Take 1 tablet (10 mg total) by mouth daily. 03/15/21   DTonia Ghent MD  clopidogrel (PLAVIX) 75 MG tablet Take 1 tablet (75 mg total) by mouth daily with breakfast. 03/15/21   DTonia Ghent MD  diazepam (VALIUM) 5 MG tablet TAKE 1 TO 2 TABLETS BY  MOUTH EVERY 12 HOURS AS  NEEDED FOR ANXIETY AND FOR  INSOMNIA . SEDATION CAUTION 08/12/20   DTonia Ghent MD  Homeopathic Products ([facility]  07/16/19   [provider]  hydrocortisone cream 1 % Apply 1 application topically daily as needed for itching.    [provider]  Lidocaine HCl-Benzyl Alcohol (SALONPAS LIDOCAINE PLUS) 4-10 % CREA Apply topically as needed.    [provider]  methocarbamol (ROBAXIN) 500 MG tablet Take 250-500 mg by mouth daily as needed for muscle spasms.    [provider]  nitroGLYCERIN (NITROSTAT) 0.4 MG SL tablet Place 1 tablet (0.4 mg total) under the tongue every 5 (five) minutes as needed for chest pain. 10/25/19   GBurtis Junes NP  OVER THE COUNTER MEDICATION Apply 1 application topically daily as needed (pain - PENETREX Cream).    [provider]  pantoprazole (PROTONIX) 40 MG tablet Take 1 tablet (40 mg total) by mouth daily. 03/15/21   DTonia Ghent MD  REPATHA SURECLICK 1903MG/ML SOAJ ADMINISTER 1 ML UNDER THE SKIN EVERY 14 DAYS 03/11/21   SJerline Pain MD  rOPINIRole (REQUIP) 0.5 MG tablet Take 1 tablet (0.5 mg total) by mouth at bedtime. 03/19/21   DTonia Ghent MD  SIMPLY SALINE NA Place 1 spray into both nostrils 2 (two) times daily.     [provider]  traMADol (ULTRAM) 50 MG tablet TAKE 1 TO 2 TABLETS(50 TO 100 MG) BY MOUTH EVERY 8 HOURS AS NEEDED FOR PAIN 10/02/21   DTonia Ghent MD     Family History Family History  Problem Relation Age of Onset   Alzheimer's disease Father    Arrhythmia Mother    Prostate cancer Maternal Uncle    Colon cancer Neg Hx    Heart attack Neg Hx    Hypertension Neg Hx     Social History Social History   Tobacco Use   Smoking status: Former    Packs/day: 1.00    Types: Cigarettes    Quit date: 04/12/1984    Years since quitting: 37.6   Smokeless tobacco: Never  Vaping Use   Vaping Use: Never used  Substance Use  Topics   Alcohol use: No    Alcohol/week: 0.0 standard drinks of alcohol   Drug use: No     Allergies   Xarelto [rivaroxaban], Alprazolam, Benadryl allergy [diphenhydramine hcl], Doxycycline, Haldol [haloperidol], Lactose intolerance (gi), Lipitor [atorvastatin], Melatonin, Morphine and related, Tamsulosin, Zolpidem tartrate, Crestor [rosuvastatin calcium], and Zetia [ezetimibe]   Review of Systems Review of Systems  Constitutional:  Positive for chills and fever. Negative for activity change, appetite change, diaphoresis, fatigue and unexpected weight change.  HENT:  Positive for congestion, sinus pressure and sinus pain. Negative for dental problem, drooling, ear discharge, ear pain, facial swelling, hearing loss, mouth sores, nosebleeds, postnasal drip, rhinorrhea, sneezing, sore throat, tinnitus, trouble swallowing and voice change.   Respiratory:  Positive for cough. Negative for apnea, choking, chest tightness, shortness of breath, wheezing and stridor.   Cardiovascular: Negative.   Gastrointestinal: Negative.   Skin: Negative.      Physical Exam Triage Vital Signs ED Triage Vitals  Enc Vitals Group     BP 11/17/21 1748 116/76     Pulse Rate 11/17/21 1748 76     Resp 11/17/21 1748 18     Temp 11/17/21 1748 98.7 F (37.1 C)     Temp src --      SpO2 11/17/21 1748 95 %     Weight --      Height --      Head Circumference --      Peak Flow --      Pain Score 11/17/21 1745 0     Pain Loc --       Pain Edu? --      Excl. in [place]? --    No data found.  Updated Vital Signs BP 116/76   Pulse 76   Temp 98.7 F (37.1 C) Comment: tylenol at home  Resp 18   SpO2 95%   Visual Acuity Right Eye Distance:   Left Eye Distance:   Bilateral Distance:    Right Eye Near:   Left Eye Near:    Bilateral Near:     Physical Exam Constitutional:      Appearance: Normal appearance.  HENT:     Head: Normocephalic.     Right Ear: Hearing, tympanic membrane, ear canal and external ear normal.     Left Ear: Hearing, tympanic membrane, ear canal and external ear normal.     Nose: Congestion present.     Right Sinus: Frontal sinus tenderness present. No maxillary sinus tenderness.     Left Sinus: No maxillary sinus tenderness or frontal sinus tenderness.  Eyes:     Extraocular Movements: Extraocular movements intact.  Cardiovascular:     Rate and Rhythm: Normal rate and regular rhythm.     Pulses: Normal pulses.     Heart sounds: Normal heart sounds.  Pulmonary:     Effort: Pulmonary effort is normal.     Breath sounds: Normal breath sounds.  Musculoskeletal:     Cervical back: Normal range of motion and neck supple.  Skin:    General: Skin is warm and dry.  Neurological:     Mental Status: He is alert and oriented to person, place, and time. Mental status is at baseline.  Psychiatric:        Mood and Affect: Mood normal.        Behavior: Behavior normal.      UC Treatments / Results  Labs (all labs ordered are listed, but only abnormal results are displayed) Labs Reviewed -  No data to display  EKG   Radiology No results found.  Procedures Procedures (including critical care time)  Medications Ordered in UC Medications - No data to display  Initial Impression / Assessment and Plan / UC Course  I have reviewed the triage vital signs and the nursing notes.  Pertinent labs & imaging results that were available during my care of the patient were reviewed by me and  considered in my medical decision making (see chart for details).  COVID-19  Patient is in no signs of distress nor toxic appearing.  Vital signs are stable.  Low suspicion for pneumonia, pneumothorax or bronchitis and therefore will defer imaging.  Home COVID test is positive, discussed quarantine guidelines, antiviral treatment prescribed and discussed administration, also provided scribed Flonase for management of sinus congestion and recommended additional over-the-counter medications for support, may follow-up with urgent care as needed if symptoms worsen or he begins to have difficulty breathing   Final Clinical Impressions(s) / UC Diagnoses   Final diagnoses:  None   Discharge Instructions   None    ED Prescriptions   None    PDMP not reviewed this encounter.   Hans Eden, NP 11/17/21 [phone]

## 2021-11-18 NOTE — Telephone Encounter (Signed)
Please see note below access nurse note; pt has already spoken with [facility] LPN this morning and pt was seen [facility] on 11/17/21.     [place] Night - Client TELEPHONE ADVICE RECORD AccessNurse Patient Name: [name] Gender: Male DOB: Jul 25, 1945 Age: 76 Y 26 M 1 D Return Phone Number: [phone] (Primary), [phone] (Secondary) Address: City/ State/ Zip: [place]  [address] Client [place] Night - Client Client Site [place] - Night Provider Renford Dills - MD Contact Type Call Who Is Calling Patient / Member / Family / Caregiver Call Type Triage / Clinical Relationship To Patient Self Return Phone Number [phone] (Primary) Chief Complaint Fever (non-urgent symptom) (greater than THREE MONTHS old) Reason for Call Symptomatic / Request for [name] states he has a fever of 102 and tested positive for COVID today. Translation No Nurse Assessment Nurse: Cristino Martes, RN, Joelene Millin Date/Time (Eastern Time): 11/17/2021 4:31:58 PM Confirm and document reason for call. If symptomatic, describe symptoms. ---Caller states he has a T102 (temporal) and tested positive for COVID today. Symptoms started late last night. Symptoms: fever, cough, sinus pain and no energy. Drinking water. Does the patient have any new or worsening symptoms? ---Yes Will a triage be completed? ---Yes Related visit to physician within the last 2 weeks? ---No Does the PT have any chronic conditions? (i.e. diabetes, asthma, this includes High risk factors for pregnancy, etc.) ---Yes List chronic conditions. ---HTN, heart stents, bilateral hip replacement Is this a behavioral health or substance abuse call? ---No Guidelines Guideline Title Affirmed Question Affirmed Notes Nurse Date/Time ([place] Time) COVID-19 - Diagnosed or Suspected [1] Fever > 101 F (38.3 C) AND [2] age > 76  years [place], RNJoelene Millin 11/17/2021 4:34:38 PM Disp. Time Eilene Ghazi Time) Disposition Final User 11/17/2021 4:13:16 PM Clinical Call Cristino Martes, RN, [facility] 11/17/2021 4:13:25 PM Send To Clinical Follow Up Christianne Borrow, RN, Joelene Millin 11/17/2021 4:17:27 PM Attempt made - message left Stri[place], RN, Joelene Millin PLEASE NOTE: All timestamps contained within this report are represented as [place] Standard Time. CONFIDENTIALTY NOTICE: This fax transmission is intended only for the addressee. It contains information that is legally privileged, confidential or otherwise protected from use or disclosure. If you are not the intended recipient, you are strictly prohibited from reviewing, disclosing, copying using or disseminating any of this information or taking any action in reliance on or regarding this information. If you have received this fax in error, please notify us immediately by telephone so that we can arrange for its return to [place]. Phone: [phone], Toll-Free: [phone], Fax: [phone] Page: 2 of 2 Call Id: [id number] [place]. Time Eilene Ghazi Time) Disposition Final User 11/17/2021 4:39:23 PM See HCP within 4 Hours (or PCP triage) Yes Cristino Martes, RN, Joelene Millin Final Disposition 11/17/2021 4:39:23 PM See HCP within 4 Hours (or PCP triage) Yes Stripling, RN, Renea Ee Disagree/Comply Comply Caller Understands Yes PreDisposition Call Doctor Care Advice Given Per Guideline SEE HCP (OR PCP TRIAGE) WITHIN 4 HOURS: * IF OFFICE WILL BE CLOSED AND NO PCP (PRIMARY CARE PROVIDER) SECOND-LEVEL TRIAGE: You need to be seen within the next 3 or 4 hours. A nearby Urgent Care Center [facility]) is often a good source of care. Another choice is to go to the ED. Go sooner if you become worse. COVID-19 - HOW TO PROTECT OTHERS - WHEN YOU ARE SICK WITH COVID-19: * STAY HOME A MINIMUM OF 5 DAYS: People with MILD COVID-19 can STOP HOME ISOLATION  AFTER 5 DAYS if (1) fever has been gone for 24 hours (without  using fever medicine) AND (2) symptoms are better. Continue to wear a well-fitted mask for a full 10 days when around others. * WEAR A MASK FOR 10 DAYS: Wear a well-fitted mask for 10 full days any time you are around others inside your home or in public. Do not go to places where you are unable to wear a mask. * AVOID TRAVEL: Avoid travel for 10 days after you tested positive for COVID-19. * [place] HANDS OFTEN: Wash hands often with soap and water. After coughing or sneezing are important times. If soap and water are not available, use an alcohol-based hand sanitizer with at least 60% alcohol, covering all surfaces of your hands and rubbing them together until they feel dry. Avoid touching your eyes, nose, and mouth with unwashed hands. * Feeling dehydrated: Drink extra liquids. If the air in your home is dry, use a humidifier. * Fever, Chills, and Sweats: For fever over 101 F (38.3 C), take acetaminophen every 4 to 6 hours (Adults 650 mg) OR ibuprofen every 6 to 8 hours (Adults 400 mg). Before taking any medicine, read all the instructions on the package. Do not take aspirin unless your doctor has prescribed it for you. Chills can sometimes come before a fever. You may feel cold in your hands and feet. You may have shivering. You may also feel sweaty as your body temperature goes down. * Muscle aches, headache, and other pains: Often this comes and goes with the fever. Take acetaminophen every 4 to 6 hours (Adults 650 mg) OR ibuprofen every 6 to 8 hours (Adults 400 mg). Before taking any medicine, read all the instructions on the package. CALL BACK IF: * You become worse CARE ADVICE given per COVID-19 - DIAGNOSED OR SUSPECTED (Adult) guideline. Comments User: Keith Desanctis, RN Date/Time Eilene Ghazi Time): 11/17/2021 4:17:47 PM error with clinical call dispo Referrals [facility]

## 2021-11-18 NOTE — Telephone Encounter (Signed)
Spoke to patient by telephone and was advised that he went to the Urgent Care yesterday and tested positive for covid. Patient stated that he was given medication. Patient stated that he is not sleeping good but is feeling some better this morning.

## 2021-11-19 ENCOUNTER — Encounter: Payer: Self-pay | Admitting: Family Medicine

## 2021-11-22 ENCOUNTER — Encounter: Payer: Self-pay | Admitting: Family Medicine

## 2021-11-24 ENCOUNTER — Encounter: Payer: Self-pay | Admitting: Family Medicine

## 2021-11-25 ENCOUNTER — Encounter: Payer: Self-pay | Admitting: Cardiology

## 2021-11-25 NOTE — Telephone Encounter (Signed)
Recommend in-person OV for further evaluation of COVID illness that started 11/17/2021. He has taken antiviral. Plz call and schedule with [place] or at [facility].

## 2021-11-25 NOTE — Telephone Encounter (Signed)
Patient notified as instructed by telephone and verbalized understanding. Patient stated when he first was diagnosed with covid his fever was 102.0 Patient stated his temperature now is 99.0. Patient denies any SOB or difficulty breathing. Patient scheduled an appointment with Romilda Garret NP tomorrow 11/26/21 at 12:00 noon. Patient was advised to wear a mask. Patient was given ER precautions and he verbalized understanding.

## 2021-11-26 ENCOUNTER — Ambulatory Visit (INDEPENDENT_AMBULATORY_CARE_PROVIDER_SITE_OTHER): Payer: Medicare Other | Admitting: Nurse Practitioner

## 2021-11-26 VITALS — BP 118/86 | HR 77 | Temp 98.6°F | Resp 16 | Wt 163.0 lb

## 2021-11-26 DIAGNOSIS — J0140 Acute pansinusitis, unspecified: Secondary | ICD-10-CM | POA: Diagnosis not present

## 2021-11-26 DIAGNOSIS — J3489 Other specified disorders of nose and nasal sinuses: Secondary | ICD-10-CM | POA: Insufficient documentation

## 2021-11-26 DIAGNOSIS — R21 Rash and other nonspecific skin eruption: Secondary | ICD-10-CM

## 2021-11-26 MED ORDER — NITROGLYCERIN 0.4 MG SL SUBL: 0.4000 mg | SUBLINGUAL_TABLET | SUBLINGUAL | 2 refills | Status: DC | PRN

## 2021-11-26 MED ORDER — METHYLPREDNISOLONE ACETATE 40 MG/ML IJ SUSP
40.0000 mg | Freq: Once | INTRAMUSCULAR | Status: AC
  Administered 2021-11-26: 40 mg via INTRAMUSCULAR

## 2021-11-26 MED ORDER — AMOXICILLIN-POT CLAVULANATE 875-125 MG PO TABS: 1.0000 | ORAL_TABLET | Freq: Two times a day (BID) | ORAL | 0 refills | Status: AC

## 2021-11-26 NOTE — Assessment & Plan Note (Signed)
Consistent for sinusitis.  We will treat patient with Augmentin 875-120 mg twice daily for 7 days.  Follow-up if no improvement

## 2021-11-26 NOTE — Assessment & Plan Note (Signed)
Likely drug-induced from antiviral.  Resolving currently.  Will administer Depo-Medrol 40 mg IM x1 dose.  Patient can use over-the-counter Claritin or Zyrtec as needed to help with itching.

## 2021-11-26 NOTE — Progress Notes (Signed)
Acute Office Visit  Subjective:     Patient ID: Keith Kunert., male    DOB: 10/05/1945, 76 y.o.   MRN: [id number]  Chief Complaint  Patient presents with   Sinus Problem    Sinus pressure, head feels very full, fatigue. Cough improved. Had Covid on 11/17/21 and took anti viral medication that helped some but now feels like he is developing sinus issues now.    Rash    Developed in the last week after starting anti viral medication for Covid. Rash was on his back, shoulders, upper chest lower chest and abdomen. Improved some now, some itching present on the sides of his abdomen      Patient is in today for multiple complaints  Sinus problems: Patient was diagnosised with covid on 11/17/2021. He was prescribed molnupiravir and took the course. Since then he has started to develop sinus symptoms. States that after taking the antiviral he started feeling better. But now he is feeling fatigued and still having sinus pressure. States he has been using codiene cough syrup and coricidin medication and tylenol  Rash: states that it started 1-2 days after the antiviral. States that it was on his trunk and it itched. States that it seems to be reolving and some itching still but it has improved since onset.  Review of Systems  Constitutional:  Positive for fever. Negative for chills.  HENT:  Positive for ear pain and sinus pain. Negative for ear discharge.   Respiratory:  Positive for cough. Negative for shortness of breath.   Cardiovascular:  Negative for chest pain.  Skin:  Positive for rash.  Neurological:  Negative for headaches.        Objective:    BP 118/86   Pulse 77   Temp 98.6 F (37 C)   Resp 16   Wt 163 lb (73.9 kg)   SpO2 95%   BMI 25.53 kg/m    Physical Exam Vitals and nursing note reviewed.  Constitutional:      Appearance: Normal appearance.  HENT:     Right Ear: Tympanic membrane, ear canal and external ear normal.     Left Ear: Tympanic membrane, ear  canal and external ear normal.     Nose:     Right Sinus: Maxillary sinus tenderness and frontal sinus tenderness present.     Left Sinus: Maxillary sinus tenderness and frontal sinus tenderness present.  Cardiovascular:     Rate and Rhythm: Normal rate and regular rhythm.     Heart sounds: Normal heart sounds.  Pulmonary:     Effort: Pulmonary effort is normal.     Breath sounds: Normal breath sounds.  Skin:    General: Skin is warm.     Findings: Rash present.          Comments: Rash small macular erythematous circles none coalescing no concern for shingles  Neurological:     Mental Status: He is alert.     No results found for any visits on 11/26/21.      Assessment & Plan:   Problem List Items Addressed This Visit       Respiratory   Acute non-recurrent pansinusitis - Primary    Consistent for sinusitis.  We will treat patient with Augmentin 875-120 mg twice daily for 7 days.  Follow-up if no improvement      Relevant Medications   amoxicillin-clavulanate (AUGMENTIN) 875-125 MG tablet     Musculoskeletal and Integument   Rash    Likely  drug-induced from antiviral.  Resolving currently.  Will administer Depo-Medrol 40 mg IM x1 dose.  Patient can use over-the-counter Claritin or Zyrtec as needed to help with itching.        Other   Sinus pressure    Patient continue using Flonase and nasal saline.  Did discuss epistaxis precautions with Flonase.  Will administer Depo-Medrol 40 mg IM x1 dose follow-up if no improvement.       Meds ordered this encounter  Medications   amoxicillin-clavulanate (AUGMENTIN) 875-125 MG tablet    Sig: Take 1 tablet by mouth 2 (two) times daily for 7 days.    Dispense:  14 tablet    Refill:  0    Order Specific Question:   Supervising Provider    Answer:   TOWER, MARNE A [[id number]]   methylPREDNISolone acetate (DEPO-MEDROL) injection 40 mg    Return if symptoms worsen or fail to improve.  Romilda Garret, NP

## 2021-11-26 NOTE — Patient Instructions (Signed)
Nice to see you today We gave you a dose of steroid in the muscle today I sent an antibiotic to the pharmacy You can get either Claritin or Zyrtec over the counter to help with the itching. This can make you sleepy, so take it in the evening

## 2021-11-26 NOTE — Addendum Note (Signed)
Addended by: Carter Kitten D on: 11/26/2021 07:55 AM   Modules accepted: Orders

## 2021-11-26 NOTE — Assessment & Plan Note (Addendum)
Patient continue using Flonase and nasal saline.  Did discuss epistaxis precautions with Flonase.  Will administer Depo-Medrol 40 mg IM x1 dose follow-up if no improvement.

## 2021-11-26 NOTE — Telephone Encounter (Signed)
Will evaluate in office

## 2021-11-28 ENCOUNTER — Encounter: Payer: Self-pay | Admitting: Family Medicine

## 2021-11-28 NOTE — Telephone Encounter (Signed)
LOV - 11/26/21 with Romilda Garret, NP NOV - NA  RF - 10/02/21 #180/1

## 2021-11-29 ENCOUNTER — Other Ambulatory Visit: Payer: Self-pay | Admitting: Family Medicine

## 2021-11-29 MED ORDER — TRAMADOL HCL 50 MG PO TABS: ORAL_TABLET | ORAL | 2 refills | Status: DC

## 2021-12-06 ENCOUNTER — Other Ambulatory Visit: Payer: Self-pay | Admitting: Family Medicine

## 2021-12-06 NOTE — Telephone Encounter (Signed)
Last filled 08-12-21 #90 Last OV Acute 11-26-21 No Future OV [facility]

## 2021-12-09 ENCOUNTER — Encounter: Payer: Self-pay | Admitting: Family Medicine

## 2021-12-16 MED ORDER — NITROGLYCERIN 0.4 MG SL SUBL: 0.4000 mg | SUBLINGUAL_TABLET | SUBLINGUAL | 1 refills | Status: DC | PRN

## 2021-12-16 NOTE — Addendum Note (Signed)
Addended by: Carter Kitten D on: 12/16/2021 08:04 AM   Modules accepted: Orders

## 2022-01-05 ENCOUNTER — Encounter: Payer: Self-pay | Admitting: Family Medicine

## 2022-02-04 ENCOUNTER — Ambulatory Visit (INDEPENDENT_AMBULATORY_CARE_PROVIDER_SITE_OTHER): Payer: Medicare Other

## 2022-02-04 DIAGNOSIS — Z23 Encounter for immunization: Secondary | ICD-10-CM

## 2022-02-17 ENCOUNTER — Other Ambulatory Visit: Payer: Self-pay | Admitting: Family Medicine

## 2022-02-17 ENCOUNTER — Encounter: Payer: Self-pay | Admitting: Family Medicine

## 2022-02-18 MED ORDER — PANTOPRAZOLE SODIUM 40 MG PO TBEC: 40.0000 mg | DELAYED_RELEASE_TABLET | Freq: Every day | ORAL | 0 refills | Status: DC

## 2022-02-20 ENCOUNTER — Other Ambulatory Visit: Payer: Self-pay | Admitting: Cardiology

## 2022-02-20 DIAGNOSIS — E785 Hyperlipidemia, unspecified: Secondary | ICD-10-CM

## 2022-02-20 DIAGNOSIS — I2510 Atherosclerotic heart disease of native coronary artery without angina pectoris: Secondary | ICD-10-CM

## 2022-02-25 ENCOUNTER — Encounter: Payer: Self-pay | Admitting: Family Medicine

## 2022-02-26 MED ORDER — AMLODIPINE BESYLATE 10 MG PO TABS: 10.0000 mg | ORAL_TABLET | Freq: Every day | ORAL | 1 refills | Status: DC

## 2022-02-28 ENCOUNTER — Other Ambulatory Visit: Payer: Self-pay | Admitting: Family Medicine

## 2022-02-28 MED ORDER — TRAMADOL HCL 50 MG PO TABS: ORAL_TABLET | ORAL | 2 refills | Status: DC

## 2022-03-02 ENCOUNTER — Other Ambulatory Visit: Payer: Self-pay | Admitting: Family Medicine

## 2022-03-02 DIAGNOSIS — E785 Hyperlipidemia, unspecified: Secondary | ICD-10-CM

## 2022-03-11 ENCOUNTER — Other Ambulatory Visit (INDEPENDENT_AMBULATORY_CARE_PROVIDER_SITE_OTHER): Payer: Medicare Other

## 2022-03-11 DIAGNOSIS — E785 Hyperlipidemia, unspecified: Secondary | ICD-10-CM

## 2022-03-11 LAB — LIPID PANEL
Cholesterol: 99 mg/dL (ref 0–200)
HDL: 44.7 mg/dL (ref >39.00)
LDL Cholesterol: 37 mg/dL (ref 0–99)
NonHDL: 54.71
Total CHOL/HDL Ratio: 2
Triglycerides: 89 mg/dL (ref 0.0–149.0)
VLDL: 17.8 mg/dL (ref 0.0–40.0)

## 2022-03-11 LAB — COMPREHENSIVE METABOLIC PANEL
ALT: 9 U/L (ref 0–53)
AST: 16 U/L (ref 0–37)
Albumin: 4.1 g/dL (ref 3.5–5.2)
Alkaline Phosphatase: 59 U/L (ref 39–117)
BUN: 9 mg/dL (ref 6–23)
CO2: 30 mEq/L (ref 19–32)
Calcium: 8.3 mg/dL — ABNORMAL LOW (ref 8.4–10.5)
Chloride: 103 mEq/L (ref 96–112)
Creatinine, Ser: 0.83 mg/dL (ref 0.40–1.50)
GFR: 84.88 mL/min (ref >60.00)
Glucose, Bld: 90 mg/dL (ref 70–99)
Potassium: 3.7 mEq/L (ref 3.5–5.1)
Sodium: 139 mEq/L (ref 135–145)
Total Bilirubin: 0.5 mg/dL (ref 0.2–1.2)
Total Protein: 6.7 g/dL (ref 6.0–8.3)

## 2022-03-11 LAB — CBC WITH DIFFERENTIAL/PLATELET
Basophils Absolute: 0.1 10*3/uL (ref 0.0–0.1)
Basophils Relative: 1.2 % (ref 0.0–3.0)
Eosinophils Absolute: 0.1 10*3/uL (ref 0.0–0.7)
Eosinophils Relative: 2.3 % (ref 0.0–5.0)
HCT: 39.7 % (ref 39.0–52.0)
Hemoglobin: 13.2 g/dL (ref 13.0–17.0)
Lymphocytes Relative: 41.8 % (ref 12.0–46.0)
Lymphs Abs: 2.4 10*3/uL (ref 0.7–4.0)
MCHC: 33.2 g/dL (ref 30.0–36.0)
MCV: 89.9 fl (ref 78.0–100.0)
Monocytes Absolute: 0.5 10*3/uL (ref 0.1–1.0)
Monocytes Relative: 9.1 % (ref 3.0–12.0)
Neutro Abs: 2.6 10*3/uL (ref 1.4–7.7)
Neutrophils Relative %: 45.6 % (ref 43.0–77.0)
Platelets: 297 10*3/uL (ref 150.0–400.0)
RBC: 4.42 Mil/uL (ref 4.22–5.81)
RDW: 13.4 % (ref 11.5–15.5)
WBC: 5.8 10*3/uL (ref 4.0–10.5)

## 2022-03-13 ENCOUNTER — Other Ambulatory Visit (HOSPITAL_COMMUNITY): Payer: Self-pay

## 2022-03-18 ENCOUNTER — Ambulatory Visit (INDEPENDENT_AMBULATORY_CARE_PROVIDER_SITE_OTHER): Payer: Medicare Other

## 2022-03-18 ENCOUNTER — Encounter: Payer: Self-pay | Admitting: Family Medicine

## 2022-03-18 ENCOUNTER — Ambulatory Visit (INDEPENDENT_AMBULATORY_CARE_PROVIDER_SITE_OTHER)
Admission: RE | Admit: 2022-03-18 | Discharge: 2022-03-18 | Disposition: A | Discharge status: Home / Self Care | Payer: Medicare Other | Source: Ambulatory Visit | Attending: Family Medicine | Admitting: Family Medicine

## 2022-03-18 ENCOUNTER — Ambulatory Visit (INDEPENDENT_AMBULATORY_CARE_PROVIDER_SITE_OTHER): Payer: Medicare Other | Admitting: Family Medicine

## 2022-03-18 VITALS — BP 110/66 | HR 73 | Temp 97.9°F | Ht 67.0 in | Wt 164.0 lb

## 2022-03-18 VITALS — Wt 163.0 lb

## 2022-03-18 DIAGNOSIS — Z0000 Encounter for general adult medical examination without abnormal findings: Secondary | ICD-10-CM

## 2022-03-18 DIAGNOSIS — M25551 Pain in right hip: Secondary | ICD-10-CM | POA: Diagnosis not present

## 2022-03-18 DIAGNOSIS — G2581 Restless legs syndrome: Secondary | ICD-10-CM | POA: Diagnosis not present

## 2022-03-18 DIAGNOSIS — F419 Anxiety disorder, unspecified: Secondary | ICD-10-CM

## 2022-03-18 DIAGNOSIS — Z7189 Other specified counseling: Secondary | ICD-10-CM

## 2022-03-18 DIAGNOSIS — E7800 Pure hypercholesterolemia, unspecified: Secondary | ICD-10-CM

## 2022-03-18 DIAGNOSIS — I10 Essential (primary) hypertension: Secondary | ICD-10-CM

## 2022-03-18 MED ORDER — PANTOPRAZOLE SODIUM 40 MG PO TBEC: 40.0000 mg | DELAYED_RELEASE_TABLET | Freq: Every day | ORAL | 3 refills | Status: DC

## 2022-03-18 MED ORDER — CLOPIDOGREL BISULFATE 75 MG PO TABS: 75.0000 mg | ORAL_TABLET | Freq: Every day | ORAL | 3 refills | Status: DC

## 2022-03-18 MED ORDER — ROPINIROLE HCL 0.5 MG PO TABS: 0.5000 mg | ORAL_TABLET | Freq: Every day | ORAL | 3 refills | Status: DC

## 2022-03-18 MED ORDER — AMLODIPINE BESYLATE 10 MG PO TABS: 10.0000 mg | ORAL_TABLET | Freq: Every day | ORAL | 3 refills | Status: DC

## 2022-03-18 NOTE — Patient Instructions (Signed)
[name] , Thank you for taking time to come for your Medicare Wellness Visit. I appreciate your ongoing commitment to your health goals. Please review the following plan we discussed and let me know if I can assist you in the future.   Screening recommendations/referrals: Colonoscopy: aged out Recommended yearly ophthalmology/optometry visit for glaucoma screening and checkup Recommended yearly dental visit for hygiene and checkup  Vaccinations: Influenza vaccine: 02/04/22 Pneumococcal vaccine: 11/16/14 Tdap vaccine: 12/18/16 Shingles vaccine: Zostavax 03/18/07   Shingrix 02/19/19, 06/15/19   Covid-19: 03/31/19, 04/28/19, 12/13/19, 06/26/20  Advanced directives: yes  Conditions/risks identified: none  Next appointment: Follow up in one year for your annual wellness visit. 03/20/23 @ 10 am by phone  Preventive Care 77 Years and Older, Male Preventive care refers to lifestyle choices and visits with your health care provider that can promote health and wellness. What does preventive care include? A yearly physical exam. This is also called an annual well check. Dental exams once or twice a year. Routine eye exams. Ask your health care provider how often you should have your eyes checked. Personal lifestyle choices, including: Daily care of your teeth and gums. Regular physical activity. Eating a healthy diet. Avoiding tobacco and drug use. Limiting alcohol use. Practicing safe sex. Taking low doses of aspirin every day. Taking vitamin and mineral supplements as recommended by your health care provider. What happens during an annual well check? The services and screenings done by your health care provider during your annual well check will depend on your age, overall health, lifestyle risk factors, and family history of disease. Counseling  Your health care provider may ask you questions about your: Alcohol use. Tobacco use. Drug use. Emotional well-being. Home and relationship  well-being. Sexual activity. Eating habits. History of falls. Memory and ability to understand (cognition). Work and work Statistician. Screening  You may have the following tests or measurements: Height, weight, and BMI. Blood pressure. Lipid and cholesterol levels. These may be checked every 5 years, or more frequently if you are over 32 years old. Skin check. Lung cancer screening. You may have this screening every year starting at age 77 if you have a 30-pack-year history of smoking and currently smoke or have quit within the past 15 years. Fecal occult blood test (FOBT) of the stool. You may have this test every year starting at age 77 Flexible sigmoidoscopy or colonoscopy. You may have a sigmoidoscopy every 5 years or a colonoscopy every 10 years starting at age 77 Prostate cancer screening. Recommendations will vary depending on your family history and other risks. Hepatitis C blood test. Hepatitis B blood test. Sexually transmitted disease (STD) testing. Diabetes screening. This is done by checking your blood sugar (glucose) after you have not eaten for a while (fasting). You may have this done every 1-3 years. Abdominal aortic aneurysm (AAA) screening. You may need this if you are a current or former smoker. Osteoporosis. You may be screened starting at age 77 if you are at high risk. Talk with your health care provider about your test results, treatment options, and if necessary, the need for more tests. Vaccines  Your health care provider may recommend certain vaccines, such as: Influenza vaccine. This is recommended every year. Tetanus, diphtheria, and acellular pertussis (Tdap, Td) vaccine. You may need a Td booster every 10 years. Zoster vaccine. You may need this after age 15. Pneumococcal 13-valent conjugate (PCV13) vaccine. One dose is recommended after age 77 Pneumococcal polysaccharide (PPSV23) vaccine. One dose is recommended  after age 77 Talk to your health care  provider about which screenings and vaccines you need and how often you need them. This information is not intended to replace advice given to you by your health care provider. Make sure you discuss any questions you have with your health care provider. Document Released: 03/16/2015 Document Revised: 11/07/2015 Document Reviewed: 12/19/2014 Elsevier Interactive Patient Education  2017 [place] Prevention in the Home Falls can cause injuries. They can happen to people of all ages. There are many things you can do to make your home safe and to help prevent falls. What can I do on the outside of my home? Regularly fix the edges of walkways and driveways and fix any cracks. Remove anything that might make you trip as you walk through a door, such as a raised step or threshold. Trim any bushes or trees on the path to your home. Use bright outdoor lighting. Clear any walking paths of anything that might make someone trip, such as rocks or tools. Regularly check to see if handrails are loose or broken. Make sure that both sides of any steps have handrails. Any raised decks and porches should have guardrails on the edges. Have any leaves, snow, or ice cleared regularly. Use sand or salt on walking paths during winter. Clean up any spills in your garage right away. This includes oil or grease spills. What can I do in the bathroom? Use night lights. Install grab bars by the toilet and in the tub and shower. Do not use towel bars as grab bars. Use non-skid mats or decals in the tub or shower. If you need to sit down in the shower, use a plastic, non-slip stool. Keep the floor dry. Clean up any water that spills on the floor as soon as it happens. Remove soap buildup in the tub or shower regularly. Attach bath mats securely with double-sided non-slip rug tape. Do not have throw rugs and other things on the floor that can make you trip. What can I do in the bedroom? Use night lights. Make  sure that you have a light by your bed that is easy to reach. Do not use any sheets or blankets that are too big for your bed. They should not hang down onto the floor. Have a firm chair that has side arms. You can use this for support while you get dressed. Do not have throw rugs and other things on the floor that can make you trip. What can I do in the kitchen? Clean up any spills right away. Avoid walking on wet floors. Keep items that you use a lot in easy-to-reach places. If you need to reach something above you, use a strong step stool that has a grab bar. Keep electrical cords out of the way. Do not use floor polish or wax that makes floors slippery. If you must use wax, use non-skid floor wax. Do not have throw rugs and other things on the floor that can make you trip. What can I do with my stairs? Do not leave any items on the stairs. Make sure that there are handrails on both sides of the stairs and use them. Fix handrails that are broken or loose. Make sure that handrails are as long as the stairways. Check any carpeting to make sure that it is firmly attached to the stairs. Fix any carpet that is loose or worn. Avoid having throw rugs at the top or bottom of the stairs. If you  do have throw rugs, attach them to the floor with carpet tape. Make sure that you have a light switch at the top of the stairs and the bottom of the stairs. If you do not have them, ask someone to add them for you. What else can I do to help prevent falls? Wear shoes that: Do not have high heels. Have rubber bottoms. Are comfortable and fit you well. Are closed at the toe. Do not wear sandals. If you use a stepladder: Make sure that it is fully opened. Do not climb a closed stepladder. Make sure that both sides of the stepladder are locked into place. Ask someone to hold it for you, if possible. Clearly mark and make sure that you can see: Any grab bars or handrails. First and last steps. Where the  edge of each step is. Use tools that help you move around (mobility aids) if they are needed. These include: Canes. Walkers. Scooters. Crutches. Turn on the lights when you go into a dark area. Replace any light bulbs as soon as they burn out. Set up your furniture so you have a clear path. Avoid moving your furniture around. If any of your floors are uneven, fix them. If there are any pets around you, be aware of where they are. Review your medicines with your doctor. Some medicines can make you feel dizzy. This can increase your chance of falling. Ask your doctor what other things that you can do to help prevent falls. This information is not intended to replace advice given to you by your health care provider. Make sure you discuss any questions you have with your health care provider. Document Released: 12/14/2008 Document Revised: 07/26/2015 Document Reviewed: 03/24/2014 Elsevier Interactive Patient Education  2017 [employer].

## 2022-03-18 NOTE — Progress Notes (Signed)
Flu 2023 Shingles previously done PNA previously done Tetanus 2018 COVID previously done RSV d/w pt.   Colon cancer screening 2018 Prostate cancer screening- per urology.   Advance directive- wife designated if patient were incapacitated   Elevated Cholesterol: Using medications without problems: yes Muscle aches: no diffuse aches. he has R hip pain after exercise, not during.   Diet compliance: yes Exercise: yes Still on repatha.     Hypertension:               Using medication without problems or lightheadedness: only if getting up too fast.  Cautions d/w pt.   Chest pain with exertion: no Edema:no Short of breath:no    RLS.  Still on requip.  It helps.  No sx on med.  No ADE on med.  he uses a heating pad on his legs if needed.  Less sx at night when more active in the day.     Taking tramadol for pain at baseline. It helps his shoulder pain a lot. Still with hip pain with weather changes.     Diazepam used prn, no ADE on med.  Not used every night.  Anxiety d/w pt. Rare use.  Brother in law recently died, mult in laws recently died.  Condolences offered.  Hydrocortisone used prn for dry skin on the legs, esp in winter.    Noninflammed seb cyst on the back.  I asked him to update me as needed.  Meds, vitals, and allergies reviewed.   PMH and SH reviewed  ROS: Per HPI unless specifically indicated in ROS section   GEN: nad, alert and oriented HEENT: ncat NECK: supple w/o LA CV: rrr. PULM: ctab, no inc wob ABD: soft, +bs EXT: no edema SKIN: no acute rash, noninflamed sebaceous cyst noted on the back. Normal right hip range of motion.  Normal flexion and internal rotation.

## 2022-03-18 NOTE — Patient Instructions (Addendum)
Go to the lab on the way out.   If you have mychart we'll likely use that to update you.    °Take care.  Glad to see you. °Update me as needed.   °

## 2022-03-18 NOTE — Progress Notes (Signed)
Virtual Visit via Telephone Note  I connected with  [name]. on 03/18/22 at 11:00 AM EST by telephone and verified that I am speaking with the correct person using two identifiers.  Location: Patient: home Provider: [place] Persons participating in the virtual visit: [place]   I discussed the limitations, risks, security and privacy concerns of performing an evaluation and management service by telephone and the availability of in person appointments. The patient expressed understanding and agreed to proceed.  Interactive audio and video telecommunications were attempted between this nurse and patient, however failed, due to patient having technical difficulties OR patient did not have access to video capability.  We continued and completed visit with audio only.  Some vital signs may be absent or patient reported.   Dionisio David, LPN  Subjective:   [name]. is a 77 y.o. male who presents for Medicare Annual/Subsequent preventive examination.  Review of Systems     Cardiac Risk Factors include: advanced age (>20mn, >>48women);dyslipidemia;male gender;hypertension     Objective:    Today's Vitals   03/18/22 1100  PainSc: 4    There is no height or weight on file to calculate BMI.     03/18/2022   11:09 AM 10/19/2020    6:17 PM 01/04/2019    3:39 PM 03/18/2018   10:45 AM 03/15/2018   11:23 AM 12/10/2016    1:40 PM 06/01/2016    8:55 PM  Advanced Directives  Does Patient Have a Medical Advance Directive? Yes Yes Yes Yes Yes Yes No  Type of AParamedicof ADavistonLiving will Living will;Healthcare Power of AMcGregorLiving will HLestervilleLiving will HWisconsin DellsLiving will HWebbLiving will   Does patient want to make changes to medical advance directive? No - Patient declined   No - Patient declined No - Patient declined     Copy of [place]in Chart? Yes - validated most recent copy scanned in chart (See row information)  No - copy requested No - copy requested No - copy requested No - copy requested     Current Medications (verified) Outpatient Encounter Medications as of 03/18/2022  Medication Sig   Alcaftadine 0.25 % SOLN Place 1 drop into both eyes daily as needed (LASTACAFT - itchy eyes).    amLODipine (NORVASC) 10 MG tablet Take 1 tablet (10 mg total) by mouth daily.   clopidogrel (PLAVIX) 75 MG tablet TAKE 1 TABLET BY MOUTH DAILY  WITH BREAKFAST   diazepam (VALIUM) 5 MG tablet TAKE 1 TO 2 TABLETS BY  MOUTH EVERY 12 HOURS AS  NEEDED FOR ANXIETY AND FOR  INSOMNIA . SEDATION CAUTION   Homeopathic Products (THERAWORX RELIEF) FOAM    hydrocortisone cream 1 % Apply 1 application topically daily as needed for itching.   Lidocaine HCl-Benzyl Alcohol (SALONPAS LIDOCAINE PLUS) 4-10 % CREA Apply topically as needed.   nitroGLYCERIN (NITROSTAT) 0.4 MG SL tablet Place 1 tablet (0.4 mg total) under the tongue every 5 (five) minutes as needed for chest pain.   pantoprazole (PROTONIX) 40 MG tablet Take 1 tablet (40 mg total) by mouth daily.   REPATHA SURECLICK 1973MG/ML SOAJ ADMINISTER 1 ML UNDER THE SKIN EVERY 14 DAYS   rOPINIRole (REQUIP) 0.5 MG tablet Take 1 tablet (0.5 mg total) by mouth at bedtime.   SIMPLY SALINE NA Place 1 spray into both nostrils 2 (two) times daily.  traMADol (ULTRAM) 50 MG tablet TAKE 1 TO 2 TABLETS(50 TO 100 MG) BY MOUTH EVERY 8 HOURS AS NEEDED FOR PAIN   [DISCONTINUED] fluticasone (FLONASE) 50 MCG/ACT nasal spray Place 1 spray into both nostrils 2 (two) times daily as needed for allergies or rhinitis. (Patient not taking: Reported on 11/26/2021)   [DISCONTINUED] methocarbamol (ROBAXIN) 500 MG tablet Take 250-500 mg by mouth daily as needed for muscle spasms.   No facility-administered encounter medications on file as of 03/18/2022.    Allergies (verified) Xarelto  [rivaroxaban], Alprazolam, Benadryl allergy [diphenhydramine hcl], Doxycycline, Haldol [haloperidol], Lactose intolerance (gi), Lipitor [atorvastatin], Melatonin, Morphine and related, Tamsulosin, Zolpidem tartrate, Crestor [rosuvastatin calcium], and Zetia [ezetimibe]   History: Past Medical History:  Diagnosis Date   Adjustment reaction with physical symptoms    Anxiety    Cancer, skin, squamous cell    R shoulder, removed per derm 2012 and chest   Coronary artery disease    a. 05/2015 c/p post-op surgery; b. 07/2015 MV EF 53%, basal inferoseptal, mid inferoseptal, and apical inferior mild ischemia;  c. 08/2015 Cath/PCI: LM nl, LAD 53m(3.5x20 Promus DES), 237mLCX nl, RCA nl, EF 60%.   Diverticulosis 2018   Noted on colonoscopy   Ear ringing    Left   Elevated PSA    h/o with normal bx per URO   GERD (gastroesophageal reflux disease)    Hearing loss    Slight left ear   History of BPH    History of kidney stones    Hydronephrosis    Left, urteral obstruction, 2-3 cm UVJ   Hyperlipidemia    Hypertension    Nephrolithiasis    OA (osteoarthritis)    with r hip replacement   Osteoarthrosis involving, or with mention of more than one site, but not specified as generalized, site unspecified(715.80)    Prostate cancer (HCC)    Restless legs syndrome (RLS)    Ruptured lumbar disc    Syncope    negative cardiac w/u per Dr VaIrish Lack Tick bite 2011   likely tick associated illness, treated with doxy with resolution,   Viral exanthem, unspecified    Past Surgical History:  Procedure Laterality Date   ACHILLES TENDON REPAIR Left 1984   CARDIAC CATHETERIZATION N/A 08/08/2015   Procedure: Left Heart Cath and Coronary Angiography;  Surgeon: MaJerline PainMD;  [facility];  Service: Cardiovascular;  Laterality: N/A;   CARDIAC CATHETERIZATION N/A 08/08/2015   Procedure: Coronary Stent Intervention;  Surgeon: JaJettie BoozeMD;  [facility];  Service:  Cardiovascular;  Laterality: N/A;   COLONOSCOPY  2018   CORONARY STENT PLACEMENT  08/08/2015   MID LAD WITH DES   JOINT REPLACEMENT     SHOULDER ARTHROSCOPY  2012   R rotator cuff (biceps tear not repaired)   SKIN CANCER EXCISION     right shoulder and mid chest   TOTAL HIP ARTHROPLASTY     right   TOTAL HIP ARTHROPLASTY Left 05/02/2015   Procedure: TOTAL LEFT HIP ARTHROPLASTY ANTERIOR APPROACH;  Surgeon: FrGaynelle ArabianMD;  Location: [facility];  Service: Orthopedics;  Laterality: Left;   TOTAL SHOULDER ARTHROPLASTY Left 03/18/2018   Procedure: TOTAL SHOULDER ARTHROPLASTY;  Surgeon: SuJustice BritainMD;  Location: [facility];  Service: Orthopedics;  Laterality: Left;  12041m  Family History  Problem Relation Age of Onset   Alzheimer's disease Father    Arrhythmia Mother    Prostate cancer Maternal Uncle  Colon cancer Neg Hx    Heart attack Neg Hx    Hypertension Neg Hx    Social History   Socioeconomic History   Marital status: Married    Spouse name: Not on file   Number of children: Not on file   Years of education: Not on file   Highest education level: Not on file  Occupational History   Occupation: Lobbyist: [employer]  Tobacco Use   Smoking status: Former    Packs/day: 1.00    Types: Cigarettes    Quit date: 04/12/1984    Years since quitting: 37.9   Smokeless tobacco: Never  Vaping Use   Vaping Use: Never used  Substance and Sexual Activity   Alcohol use: No    Alcohol/week: 0.0 standard drinks of alcohol   Drug use: No   Sexual activity: Yes  Other Topics Concern   Not on file  Social History Narrative   Went to [place], previous [employer] service ('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     Retired.     His youngest daughter is deceased   Social Determinants of Adult nurse Strain: Low Risk  (03/18/2022)   Overall Financial Resource Strain (CARDIA)    Difficulty of Paying Living Expenses: Not hard at all  Food Insecurity: No Food Insecurity (03/18/2022)   Hunger Vital Sign    Worried About Running Out of Food in the Last Year: Never true    Ran Out of Food in the Last Year: Never true  Transportation Needs: No Transportation Needs (03/18/2022)   PRAPARE - Hydrologist (Medical): No    Lack of Transportation (Non-Medical): No  Physical Activity: Sufficiently Active (03/18/2022)   Exercise Vital Sign    Days of Exercise per Week: 3 days    Minutes of Exercise per Session: 60 min  Stress: No Stress Concern Present (03/18/2022)   [place]    Feeling of Stress : Not at all  Social Connections: Moderately Integrated (03/18/2022)   Social Connection and Isolation Panel [NHANES]    Frequency of Communication with Friends and Family: More than three times a week    Frequency of Social Gatherings with Friends and Family: Once a week    Attends Religious Services: More than 4 times per year    Active Member of [employer] or Organizations: No    Attends Music therapist: Never    Marital Status: Married    Tobacco Counseling Counseling given: Not Answered   Clinical Intake:  Pre-visit preparation completed: Yes  Pain : 0-10 Pain Score: 4  Pain Type: Chronic pain Pain Location: Hip Pain Orientation: Right     Diabetes: No  How often do you need to have someone help you when you read instructions, pamphlets, or other written materials from your doctor or pharmacy?: 1 - Never  Diabetic?no  Interpreter Needed?: No  Information entered by :: Kirke Shaggy, LPN   Activities of Daily Living    03/18/2022   11:10 AM  In your present state of health, do you have any difficulty performing the following activities:  Hearing? 1   Vision? 0  Difficulty concentrating or making decisions? 0  Walking or climbing stairs? 0  Dressing or bathing? 0  Doing errands, shopping? 0  Preparing Food and eating ? N  Using the Toilet? N  In the past six months, have you accidently leaked urine? N  Do you have problems with loss of bowel control? N  Managing your Medications? N  Managing your Finances? N  Housekeeping or managing your Housekeeping? N    Patient Care Team: Tonia Ghent, MD as PCP - General Marlou Porch Thana Farr, MD as PCP - Cardiology (Cardiology) Melissa Noon, OD as Referring Physician (Optometry) Franchot Gallo, MD as Consulting Physician (Urology)  Indicate any recent Medical Services you may have received from other than [facility] providers in the past year (date may be approximate).     Assessment:   This is a routine wellness examination for [name].  Hearing/Vision screen Hearing Screening - Comments:: No aids Vision Screening - Comments:: Readers- Dr.Bowen   Dietary issues and exercise activities discussed: Current Exercise Habits: Home exercise routine, Type of exercise: walking;Other - see comments (pickleball), Time (Minutes): 60, Frequency (Times/Week): 3, Weekly Exercise (Minutes/Week): 180, Intensity: Moderate   Goals Addressed             This Visit's Progress    DIET - EAT MORE FRUITS AND VEGETABLES         Depression Screen    03/18/2022   11:04 AM 03/15/2021   12:03 PM 03/05/2020    3:39 PM 01/04/2019    3:41 PM 12/24/2017   10:00 AM 12/10/2016    1:21 PM 12/07/2015   11:04 AM  PHQ 2/9 Scores  PHQ - 2 Score 1 0 0 0 0 0 6  PHQ- 9 Score    0  0     Fall Risk    03/18/2022   11:10 AM 03/15/2021   12:03 PM 03/05/2020    3:39 PM 01/04/2019    3:41 PM 12/24/2017   10:00 AM  [place] in the past year? 0 0 0 0 Yes  Number falls in past yr: 0 0 0 0 1  Injury with Fall? 0 0 0 0   Risk for fall due to : No Fall Risks No Fall Risks  Medication side effect   Follow up Falls  prevention discussed;Falls evaluation completed Falls evaluation completed Falls evaluation completed Falls evaluation completed;Falls prevention discussed     FALL RISK PREVENTION PERTAINING TO THE HOME:  Any stairs in or around the home? No  If so, are there any without handrails? No  Home free of loose throw rugs in walkways, pet beds, electrical cords, etc? Yes  Adequate lighting in your home to reduce risk of falls? Yes   ASSISTIVE DEVICES UTILIZED TO PREVENT FALLS:  Life alert? No  Use of a cane, walker or w/c? No  Grab bars in the bathroom? Yes  Shower chair or bench in shower? Yes  Elevated toilet seat or a handicapped toilet? Yes    Cognitive Function:    01/04/2019    3:44 PM 12/10/2016    1:21 PM  MMSE - Mini Mental State Exam  Orientation to time 5 5  Orientation to Place 5 5  Registration 3 3  Attention/ Calculation 5 0  Recall 3 3  Language- name 2 objects  0  Language- repeat 1 1  Language- follow 3 step command  3  Language- read & follow direction  0  Write a sentence  0  Copy design  0  Total  score  20        03/18/2022   11:13 AM  6CIT Screen  What Year? 0 points  What month? 0 points  What time? 0 points  Count back from 20 0 points  Months in reverse 0 points  Repeat phrase 0 points  Total Score 0 points    Immunizations Immunization History  Administered Date(s) Administered   Fluad Quad(high Dose 65+) 01/11/2019, 02/04/2022   Hep A / Hep B 03/29/2008, 06/07/2008, 11/27/2008   Influenza Split 12/31/2011, 01/16/2015   Influenza Whole 11/23/2007, 01/10/2010   Influenza, High Dose Seasonal PF 01/07/2020   Influenza,inj,Quad PF,6+ Mos 01/12/2016, 01/03/2017, 01/02/2018, 01/05/2021   Influenza,inj,quad, With Preservative 01/01/2017   Influenza-Unspecified 12/01/2012, 12/15/2013, 01/12/2016   PFIZER(Purple Top)SARS-COV-2 Vaccination 03/31/2019, 04/28/2019, 12/13/2019, 06/26/2020   Pneumococcal Conjugate-13 11/16/2014   Pneumococcal  Polysaccharide-23 12/25/2006, 10/29/2012   Td 11/20/2006   Tdap 12/18/2016   Typhoid Parenteral 03/29/2008   Zoster Recombinat (Shingrix) 02/19/2019, 06/15/2019   Zoster, Live 03/18/2007    TDAP status: Up to date  Flu Vaccine status: Up to date  Pneumococcal vaccine status: Up to date  Covid-19 vaccine status: Completed vaccines  Qualifies for Shingles Vaccine? Yes   Zostavax completed Yes   Shingrix Completed?: Yes  Screening Tests Health Maintenance  Topic Date Due   COVID-19 Vaccine (5 - 2023-24 season) 11/01/2021   Medicare Annual Wellness (AWV)  03/19/2023   DTaP/Tdap/Td (3 - Td or Tdap) 12/19/2026   Pneumonia Vaccine 50+ Years old  Completed   INFLUENZA VACCINE  Completed   Hepatitis C Screening  Completed   Zoster Vaccines- Shingrix  Completed   HPV VACCINES  Aged Out   COLONOSCOPY (Pts 45-49yr Insurance coverage will need to be confirmed)  Discontinued    Health Maintenance  Health Maintenance Due  Topic Date Due   COVID-19 Vaccine (5 - 2023-24 season) 11/01/2021    Colorectal cancer screening: No longer required.   Lung Cancer Screening: (Low Dose CT Chest recommended if Age 221-80years, 30 pack-year currently smoking OR have quit w/in 15years.) does not qualify.   Additional Screening:  Hepatitis C Screening: does qualify; Completed 12/03/15  Vision Screening: Recommended annual ophthalmology exams for early detection of glaucoma and other disorders of the eye. Is the patient up to date with their annual eye exam?  Yes  Who is the provider or what is the name of the office in which the patient attends annual eye exams? Dr.Bowen If pt is not established with a provider, would they like to be referred to a provider to establish care? No .   Dental Screening: Recommended annual dental exams for proper oral hygiene  Community Resource Referral / Chronic Care Management: CRR required this visit?  No   CCM required this visit?  No      Plan:      I have personally reviewed and noted the following in the patient's chart:   Medical and social history Use of alcohol, tobacco or illicit drugs  Current medications and supplements including opioid prescriptions. Patient is not currently taking opioid prescriptions. Functional ability and status Nutritional status Physical activity Advanced directives List of other physicians Hospitalizations, surgeries, and ER visits in previous 12 months Vitals Screenings to include cognitive, depression, and falls Referrals and appointments  In addition, I have reviewed and discussed with patient certain preventive protocols, quality metrics, and best practice recommendations. A written personalized care plan for preventive services as well as general preventive health recommendations were provided to  patient.     Dionisio David, LPN   7/68/1157   Nurse Notes: none

## 2022-03-19 ENCOUNTER — Encounter: Payer: Self-pay | Admitting: Family Medicine

## 2022-03-19 ENCOUNTER — Encounter: Payer: Self-pay | Admitting: Cardiology

## 2022-03-19 DIAGNOSIS — Z0000 Encounter for general adult medical examination without abnormal findings: Secondary | ICD-10-CM | POA: Insufficient documentation

## 2022-03-19 DIAGNOSIS — M25551 Pain in right hip: Secondary | ICD-10-CM | POA: Insufficient documentation

## 2022-03-19 NOTE — Assessment & Plan Note (Signed)
Continue work on diet and exercise.  Continue amlodipine.  Labs discussed with patient.

## 2022-03-19 NOTE — Assessment & Plan Note (Signed)
Still on requip.  It helps.  No sx on med.  No ADE on med.  he uses a heating pad on his legs if needed.  Less sx at night when more active in the day.  Continue Requip as is.

## 2022-03-19 NOTE — Assessment & Plan Note (Signed)
Continue work on diet and exercise.  Labs discussed with patient.  Continue Repatha.  Statin intolerant.

## 2022-03-19 NOTE — Assessment & Plan Note (Signed)
  Advance directive- wife designated if patient were incapacitated

## 2022-03-19 NOTE — Telephone Encounter (Signed)
Spoke with patient.   Dr. Marlou Porch is booked until May. Scheduled patient to see Dr. Marlou Porch on 08/01/2022. Placed patient on waitlist for sooner appointment if it becomes available.  Patient expressed appreciation for assistance, no further needs voiced at this time.

## 2022-03-19 NOTE — Assessment & Plan Note (Signed)
Diazepam used prn, no ADE on med.  Not used every night.  Anxiety d/w pt. Rare use.  Brother in law recently died, mult in laws recently died.  Condolences offered.  Would continue as needed diazepam as is.

## 2022-03-19 NOTE — Assessment & Plan Note (Signed)
Continue.  Tramadol.  See notes on imaging.

## 2022-03-19 NOTE — Assessment & Plan Note (Signed)
Flu 2023 Shingles previously done PNA previously done Tetanus 2018 COVID previously done RSV d/w pt.   Colon cancer screening 2018 Prostate cancer screening- per urology.   Advance directive- wife designated if patient were incapacitated

## 2022-03-20 ENCOUNTER — Encounter: Payer: Self-pay | Admitting: Family Medicine

## 2022-04-10 ENCOUNTER — Encounter: Payer: Self-pay | Admitting: Family Medicine

## 2022-04-18 ENCOUNTER — Encounter: Payer: Self-pay | Admitting: Family Medicine

## 2022-04-18 ENCOUNTER — Telehealth: Payer: Self-pay | Admitting: *Deleted

## 2022-04-18 NOTE — Telephone Encounter (Signed)
   Pre-operative Risk Assessment    Patient Name: [name].  DOB: [birth year] MRN: [id number]      Request for Surgical Clearance    Procedure:   TRUSP Bx   Date of Surgery:  Clearance 08/20/22                                 Surgeon:  DR.DAHLSTEDT Surgeon's Group or Practice Name:  [name] Phone number:  [phone] Fax number:  [phone]   Type of Clearance Requested:   - Medical  - Pharmacy:  Hold Clopidogrel (Plavix) x 5 DAYS PRIOR   Type of Anesthesia:  Not Indicated   Additional requests/questions:    Jiles Prows   04/18/2022, 11:07 AM

## 2022-04-18 NOTE — Telephone Encounter (Signed)
   Name: [name].  DOB: 10-09-1945  MRN: [id number]  Primary Cardiologist: Candee Furbish, MD  Chart reviewed as part of pre-operative protocol coverage. Because of [name]'s past medical history and time since last visit, he will require a follow-up in-office visit in order to better assess preoperative cardiovascular risk.  Pre-op covering staff: - Please schedule appointment and call patient to inform them. If patient already had an upcoming appointment within acceptable timeframe, please add "pre-op clearance" to the appointment notes so provider is aware. - Please contact requesting surgeon's office via preferred method (i.e, phone, fax) to inform them of need for appointment prior to surgery.  The biopsy is scheduled on 08/20/22. Pt has an appt with Dr. Marlou Porch on 08/01/22. I have added preop clearance to the appt notes.    [name], PA  04/18/2022, 1:37 PM

## 2022-05-29 ENCOUNTER — Other Ambulatory Visit: Payer: Self-pay

## 2022-05-29 NOTE — Telephone Encounter (Signed)
Patient requesting refill of tramadol will be out tomorrow. Would like sent to [facility]. Takes for hip and shoulder pain  Last seen 1/16  No follow up

## 2022-06-01 MED ORDER — TRAMADOL HCL 50 MG PO TABS: ORAL_TABLET | ORAL | 2 refills | Status: DC

## 2022-06-01 NOTE — Telephone Encounter (Signed)
Sent. Thanks.   

## 2022-08-01 ENCOUNTER — Encounter: Payer: Self-pay | Admitting: Cardiology

## 2022-08-01 ENCOUNTER — Telehealth: Payer: Self-pay | Admitting: *Deleted

## 2022-08-01 ENCOUNTER — Ambulatory Visit: Payer: Medicare Other | Attending: Cardiology | Admitting: Cardiology

## 2022-08-01 VITALS — BP 110/66 | HR 57 | Ht 67.5 in | Wt 163.8 lb

## 2022-08-01 DIAGNOSIS — E7800 Pure hypercholesterolemia, unspecified: Secondary | ICD-10-CM | POA: Diagnosis not present

## 2022-08-01 DIAGNOSIS — R400 Somnolence: Secondary | ICD-10-CM | POA: Diagnosis not present

## 2022-08-01 DIAGNOSIS — I2510 Atherosclerotic heart disease of native coronary artery without angina pectoris: Secondary | ICD-10-CM

## 2022-08-01 DIAGNOSIS — R0989 Other specified symptoms and signs involving the circulatory and respiratory systems: Secondary | ICD-10-CM

## 2022-08-01 DIAGNOSIS — I10 Essential (primary) hypertension: Secondary | ICD-10-CM

## 2022-08-01 MED ORDER — CLOPIDOGREL BISULFATE 75 MG PO TABS: 75.0000 mg | ORAL_TABLET | Freq: Every day | ORAL | 3 refills | Status: DC

## 2022-08-01 MED ORDER — NITROGLYCERIN 0.4 MG SL SUBL: 0.4000 mg | SUBLINGUAL_TABLET | SUBLINGUAL | 4 refills | Status: AC | PRN

## 2022-08-01 NOTE — Telephone Encounter (Signed)
Dr. Anne Fu had ordered an Itamar study for the pt while in the office. I have set up Itamar for the pt today. Pt is agreeable to signed waiver and not open the box until he has been called with the PIN#.

## 2022-08-01 NOTE — Progress Notes (Signed)
Cardiology Office Note:    Date:  08/01/2022   ID:  Keith Heidelberg., DOB Mar 17, 1945, MRN [id number]  PCP:  Joaquim Nam, MD   [facility] Providers Cardiologist:  Keith Schultz, MD     Referring MD: Joaquim Nam, MD    History of Present Illness:    Keith Wamboldt. is a 77 y.o. male here for follow-up of coronary artery disease DES June 2017 following abnormal stress test  Previously had attributed paranoia and emotional dysfunction of his cardiac medications.  He is on PCSK9 therapy.  Does not tolerate Zetia due to diarrhea.  Enjoys pickleball.  Woke up gasping for breath after laying on his back.  Usually a side sleeper.  Has upcoming prostate biopsy.  On Plavix monotherapy.  Okay to hold.  Has diverticulosis, had some abdominal discomfort but this past.  Past Medical History:  Diagnosis Date   Adjustment reaction with physical symptoms    Anxiety    Cancer, skin, squamous cell    R shoulder, removed per derm 2012 and chest   Coronary artery disease    a. 05/2015 c/p post-op surgery; b. 07/2015 MV EF 53%, basal inferoseptal, mid inferoseptal, and apical inferior mild ischemia;  c. 08/2015 Cath/PCI: LM nl, LAD 76m (3.5x20 Promus DES), 32m, LCX nl, RCA nl, EF 60%.   Diverticulosis 2018   Noted on colonoscopy   Ear ringing    Left   Elevated PSA    h/o with normal bx per URO   GERD (gastroesophageal reflux disease)    Hearing loss    Slight left ear   History of BPH    History of kidney stones    Hydronephrosis    Left, urteral obstruction, 2-3 cm UVJ   Hyperlipidemia    Hypertension    Nephrolithiasis    OA (osteoarthritis)    with r hip replacement   Osteoarthrosis involving, or with mention of more than one site, but not specified as generalized, site unspecified(715.80)    Prostate cancer (HCC)    Restless legs syndrome (RLS)    Ruptured lumbar disc    Syncope    negative cardiac w/u per Dr Eldridge Dace   Tick bite 2011   likely tick associated  illness, treated with doxy with resolution,   Viral exanthem, unspecified     Past Surgical History:  Procedure Laterality Date   ACHILLES TENDON REPAIR Left 1984   CARDIAC CATHETERIZATION N/A 08/08/2015   Procedure: Left Heart Cath and Coronary Angiography;  Surgeon: Jake Bathe, MD;  Location: [facility] INVASIVE CV LAB;  Service: Cardiovascular;  Laterality: N/A;   CARDIAC CATHETERIZATION N/A 08/08/2015   Procedure: Coronary Stent Intervention;  Surgeon: Corky Crafts, MD;  Location: [facility] INVASIVE CV LAB;  Service: Cardiovascular;  Laterality: N/A;   COLONOSCOPY  2018   CORONARY STENT PLACEMENT  08/08/2015   MID LAD WITH DES   JOINT REPLACEMENT     SHOULDER ARTHROSCOPY  2012   R rotator cuff (biceps tear not repaired)   SKIN CANCER EXCISION     right shoulder and mid chest   TOTAL HIP ARTHROPLASTY     right   TOTAL HIP ARTHROPLASTY Left 05/02/2015   Procedure: TOTAL LEFT HIP ARTHROPLASTY ANTERIOR APPROACH;  Surgeon: Ollen Gross, MD;  Location: [facility] ORS;  Service: Orthopedics;  Laterality: Left;   TOTAL SHOULDER ARTHROPLASTY Left 03/18/2018   Procedure: TOTAL SHOULDER ARTHROPLASTY;  Surgeon: Francena Hanly, MD;  Location: [facility] ORS;  Service: Orthopedics;  Laterality: Left;     Current Medications: Current Meds  Medication Sig   Alcaftadine 0.25 % SOLN Place 1 drop into both eyes daily as needed (LASTACAFT - itchy eyes).    amLODipine (NORVASC) 10 MG tablet Take 1 tablet (10 mg total) by mouth daily.   diazepam (VALIUM) 5 MG tablet TAKE 1 TO 2 TABLETS BY  MOUTH EVERY 12 HOURS AS  NEEDED FOR ANXIETY AND FOR  INSOMNIA . SEDATION CAUTION   Homeopathic Products (THERAWORX RELIEF) FOAM    hydrocortisone cream 1 % Apply 1 application topically daily as needed for itching.   Lidocaine HCl-Benzyl Alcohol (SALONPAS LIDOCAINE PLUS) 4-10 % CREA Apply topically as needed.   pantoprazole (PROTONIX) 40 MG tablet Take 1 tablet (40 mg total) by mouth daily.   REPATHA SURECLICK 140 MG/ML SOAJ  ADMINISTER 1 ML UNDER THE SKIN EVERY 14 DAYS   rOPINIRole (REQUIP) 0.5 MG tablet Take 1 tablet (0.5 mg total) by mouth at bedtime.   SIMPLY SALINE NA Place 1 spray into both nostrils 2 (two) times daily.    traMADol (ULTRAM) 50 MG tablet TAKE 1 TO 2 TABLETS(50 TO 100 MG) BY MOUTH EVERY 8 HOURS AS NEEDED FOR PAIN   [DISCONTINUED] clopidogrel (PLAVIX) 75 MG tablet Take 1 tablet (75 mg total) by mouth daily with breakfast.   [DISCONTINUED] nitroGLYCERIN (NITROSTAT) 0.4 MG SL tablet Place 1 tablet (0.4 mg total) under the tongue every 5 (five) minutes as needed for chest pain.     Allergies:   Xarelto [rivaroxaban], Alprazolam, Benadryl allergy [diphenhydramine hcl], Doxycycline, Haldol [haloperidol], Lactose intolerance (gi), Lipitor [atorvastatin], Melatonin, Morphine and codeine, Tamsulosin, Zolpidem tartrate, Crestor [rosuvastatin calcium], and Zetia [ezetimibe]   Social History   Socioeconomic History   Marital status: Married    Spouse name: Not on file   Number of children: Not on file   Years of education: Not on file   Highest education level: Not on file  Occupational History   Occupation: Garment/textile technologist: [employer]  Tobacco Use   Smoking status: Former    Packs/day: 1    Types: Cigarettes    Quit date: 04/12/1984    Years since quitting: 38.3   Smokeless tobacco: Never  Vaping Use   Vaping Use: Never used  Substance and Sexual Activity   Alcohol use: No    Alcohol/week: 0.0 standard drinks of alcohol   Drug use: No   Sexual activity: Yes  Other Topics Concern   Not on file  Social History Narrative   Went to [place], previous [employer] service ('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     Retired.     His youngest daughter is deceased   Social Determinants of Research scientist (physical sciences) Strain: Low Risk  (03/18/2022)   Overall Financial Resource Strain (CARDIA)    Difficulty of Paying Living Expenses: Not hard at all  Food Insecurity: No Food Insecurity (03/18/2022)   Hunger Vital Sign    Worried About Running Out of Food in the Last Year: Never true    Ran Out of Food in the Last Year: Never true  Transportation Needs: No Transportation Needs (03/18/2022)   PRAPARE - Transportation    Lack of Transportation (Medical): No    Lack of  Transportation (Non-Medical): No  Physical Activity: Sufficiently Active (03/18/2022)   Exercise Vital Sign    Days of Exercise per Week: 3 days    Minutes of Exercise per Session: 60 min  Stress: No Stress Concern Present (03/18/2022)   [employer] of Occupational Health - Occupational Stress Questionnaire    Feeling of Stress : Not at all  Social Connections: Moderately Integrated (03/18/2022)   Social Connection and Isolation Panel [NHANES]    Frequency of Communication with Friends and Family: More than three times a week    Frequency of Social Gatherings with Friends and Family: Once a week    Attends Religious Services: More than 4 times per year    Active Member of [employer] or Organizations: No    Attends Banker Meetings: Never    Marital Status: Married     Family History: The patient's family history includes Alzheimer's disease in his father; Arrhythmia in his mother; Prostate cancer in his maternal uncle. There is no history of Colon cancer, Heart attack, or Hypertension.  ROS:   Please see the history of present illness.     All other systems reviewed and are negative.  EKGs/Labs/Other Studies Reviewed:    The following studies were reviewed today: Left Heart Cath and Coronary Angiography 08/2015  Conclusion   Mid LAD lesion, 85% stenosed. 20% lesion approximate 20 mm past focal mid LAD lesion as described above. Normal left ventricular function, EF 60% no wall motion abnormality No aortic  stenosis Normal appearing coronary arteries otherwise.   Plan is to have evaluation by Dr. Eldridge Dace. He had chest discomfort postoperatively hip replacement, mid LAD lesion likely culprit. Nuclear stress test abnormal.    Keith Schultz, MD   Coronary Stent Intervention 08/2015  Conclusion   Mid LAD-1 lesion, 90% stenosed. Post intervention with a 3.5 x 20 Promus drug-eluting stent, postdilated to 3.6 mm, there is a 0% residual stenosis.   Continue dual antiplatelet therapy for at least a year. At that point, consider stopping aspirin. Continue aggressive secondary prevention  EKG: 08/01/2022-sinus bradycardia 57 no other abnormalities  Recent Labs: 03/11/2022: ALT 9; BUN 9; Creatinine, Ser 0.83; Hemoglobin 13.2; Platelets 297.0; Potassium 3.7; Sodium 139  Recent Lipid Panel    Component Value Date/Time   CHOL 99 03/11/2022 0916   TRIG 89.0 03/11/2022 0916   HDL 44.70 03/11/2022 0916   CHOLHDL 2 03/11/2022 0916   VLDL 17.8 03/11/2022 0916   LDLCALC 37 03/11/2022 0916   LDLDIRECT 135.6 09/10/2011 0835     Risk Assessment/Calculations:          STOP-Bang Score:  6       Physical Exam:    VS:  BP 110/66   Pulse (!) 57   Ht 5' 7.5" (1.715 m)   Wt 163 lb 12.8 oz (74.3 kg)   SpO2 96%   BMI 25.28 kg/m     Wt Readings from Last 3 Encounters:  08/01/22 163 lb 12.8 oz (74.3 kg)  03/18/22 164 lb (74.4 kg)  03/18/22 163 lb (73.9 kg)     GEN:  Well nourished, well developed in no acute distress HEENT: Normal NECK: No JVD; No carotid bruits LYMPHATICS: No lymphadenopathy CARDIAC: RRR, no murmurs, rubs, gallops RESPIRATORY:  Clear to auscultation without rales, wheezing or rhonchi  ABDOMEN: Soft, non-tender, non-distended MUSCULOSKELETAL:  No edema; No deformity  SKIN: Warm and dry NEUROLOGIC:  Alert and oriented x 3 PSYCHIATRIC:  Normal affect   ASSESSMENT:    1. Coronary  artery disease involving native coronary artery of native heart without angina pectoris   2. Pure  hypercholesterolemia   3. Essential hypertension   4. Somnolence, daytime   5. Air hunger    PLAN:    In order of problems listed above:  Coronary artery disease - Doing well without any anginal symptoms from prior LAD stent in June 2017.  On Plavix monotherapy.  No bleeding.  Hyperlipidemia with statin intolerance - On Repatha previously checked LDL 37.  Excellent.  Outside labs reviewed.  Continue with current medication management.  Preop cardiac risk -He may hold his Plavix prior to prostate biopsy June 19 for 5 days prior.  He may proceed with low overall cardiac risk.  Sleep disturbance -Has waking up gasping for breath.  Daytime somnolence.  Sleep evaluation.  Home sleep study would be reasonable.          Medication Adjustments/Labs and Tests Ordered: Current medicines are reviewed at length with the patient today.  Concerns regarding medicines are outlined above.  Orders Placed This Encounter  Procedures   EKG 12-Lead   [facility] Sleep Study   Meds ordered this encounter  Medications   nitroGLYCERIN (NITROSTAT) 0.4 MG SL tablet    Sig: Place 1 tablet (0.4 mg total) under the tongue every 5 (five) minutes as needed for chest pain.    Dispense:  25 tablet    Refill:  4   clopidogrel (PLAVIX) 75 MG tablet    Sig: Take 1 tablet (75 mg total) by mouth daily with breakfast.    Dispense:  90 tablet    Refill:  3    Patient Instructions  Medication Instructions:  The current medical regimen is effective;  continue present plan and medications.  *If you need a refill on your cardiac medications before your next appointment, please call your pharmacy*  Testing/Procedures: Your physician has recommended that you have a sleep study. This test records several body functions during sleep, including: brain activity, eye movement, oxygen and carbon dioxide blood levels, heart rate and rhythm, breathing rate and rhythm, the flow of air through your mouth and nose, snoring,  body muscle movements, and chest and belly movement.  Follow-Up: At [facility], you and your health needs are our priority.  As part of our continuing mission to provide you with exceptional heart care, we have created designated Provider Care Teams.  These Care Teams include your primary Cardiologist (physician) and Advanced Practice Providers (APPs -  Physician Assistants and Nurse Practitioners) who all work together to provide you with the care you need, when you need it.  We recommend signing up for the patient portal called "MyChart".  Sign up information is provided on this After Visit Summary.  MyChart is used to connect with patients for Virtual Visits (Telemedicine).  Patients are able to view lab/test results, encounter notes, upcoming appointments, etc.  Non-urgent messages can be sent to your provider as well.   To learn more about what you can do with MyChart, go to [email or web address].    Your next appointment:   1 year(s)  Provider:   Donato Schultz, MD       Signed, Keith Schultz, MD  08/01/2022 8:55 AM    [facility]

## 2022-08-01 NOTE — Patient Instructions (Signed)
Medication Instructions:  The current medical regimen is effective;  continue present plan and medications.  *If you need a refill on your cardiac medications before your next appointment, please call your pharmacy*  Testing/Procedures: Your physician has recommended that you have a sleep study. This test records several body functions during sleep, including: brain activity, eye movement, oxygen and carbon dioxide blood levels, heart rate and rhythm, breathing rate and rhythm, the flow of air through your mouth and nose, snoring, body muscle movements, and chest and belly movement.  Follow-Up: At [facility], you and your health needs are our priority.  As part of our continuing mission to provide you with exceptional heart care, we have created designated Provider Care Teams.  These Care Teams include your primary Cardiologist (physician) and Advanced Practice Providers (APPs -  Physician Assistants and Nurse Practitioners) who all work together to provide you with the care you need, when you need it.  We recommend signing up for the patient portal called "MyChart".  Sign up information is provided on this After Visit Summary.  MyChart is used to connect with patients for Virtual Visits (Telemedicine).  Patients are able to view lab/test results, encounter notes, upcoming appointments, etc.  Non-urgent messages can be sent to your provider as well.   To learn more about what you can do with MyChart, go to [email or web address].    Your next appointment:   1 year(s)  Provider:   Donato Schultz, MD

## 2022-08-06 NOTE — Telephone Encounter (Signed)
Prior Authorization for ITAMAR sent to [employer] via web portal. Tracking Number . READY- NO PA REQ 

## 2022-08-06 NOTE — Telephone Encounter (Signed)
Left detailed message for the pt that he has been approved for sleep study. Left vm with PIN# [id number] and if he might be able to please do study one night this week. Once the results are in we will call.

## 2022-08-07 ENCOUNTER — Encounter (INDEPENDENT_AMBULATORY_CARE_PROVIDER_SITE_OTHER): Payer: Medicare Other | Admitting: Cardiology

## 2022-08-07 DIAGNOSIS — G4733 Obstructive sleep apnea (adult) (pediatric): Secondary | ICD-10-CM | POA: Diagnosis not present

## 2022-08-12 ENCOUNTER — Ambulatory Visit: Payer: Medicare Other | Attending: Cardiology

## 2022-08-12 ENCOUNTER — Encounter: Payer: Self-pay | Admitting: Family Medicine

## 2022-08-12 DIAGNOSIS — R400 Somnolence: Secondary | ICD-10-CM

## 2022-08-12 DIAGNOSIS — R0989 Other specified symptoms and signs involving the circulatory and respiratory systems: Secondary | ICD-10-CM

## 2022-08-12 NOTE — Procedures (Signed)
SLEEP STUDY REPORT Patient Information Study Date: 08/07/2022 Patient Name: [name] Patient ID: [id number] Birth Date: 11-15-1945 Age: 77 Gender: Male BMI: 25.6 (W=163 lb, H=5' 7'') Stopbang: 6 Referring Physician: Donato Schultz, MD  TEST DESCRIPTION:  Home sleep apnea testing was completed using the WatchPat, a Type 1 device, utilizing peripheral arterial tonometry (PAT), chest movement, actigraphy, pulse oximetry, pulse rate, body position and snore.  AHI was calculated with apnea and hypopnea using valid sleep time as the denominator. RDI includes apneas, hypopneas, and RERAs.  The data acquired and the scoring of sleep and all associated events were performed in accordance with the recommended standards and specifications as outlined in the AASM Manual for the Scoring of Sleep and Associated Events 2.2.0 (2015).  FINDINGS:  1.  Moderate Obstructive Sleep Apnea with AHI 15.3/hr.   2.  No Central Sleep Apnea with pAHIc 0.9/hr.  3.  Oxygen desaturations as low as 71%.  4.  Mild snoring was present. O2 sats were < 88% for 1.5 min.  5.  Total sleep time was 8 hrs and 43 min.  6.  22.5% of total sleep time was spent in REM sleep.   7.  Shortened sleep onset latency at 5 min  8.  Prolonged REM sleep onset latency at 117 min.   9.  Total awakenings were 15.  10. Arrhythmia detection:  None  DIAGNOSIS:   Moderate Obstructive Sleep Apnea (G47.33)  RECOMMENDATIONS:   1.  Clinical correlation of these findings is necessary.  The decision to treat obstructive sleep apnea (OSA) is usually based on the presence of apnea symptoms or the presence of associated medical conditions such as Hypertension, Congestive Heart Failure, Atrial Fibrillation or Obesity.  The most common symptoms of OSA are snoring, gasping for breath while sleeping, daytime sleepiness and fatigue.   2.  Initiating apnea therapy is recommended given the presence of symptoms and/or associated conditions. Recommend  proceeding with one of the following:     a.  Auto-CPAP therapy with a pressure range of 5-20cm H2O.     b.  An oral appliance (OA) that can be obtained from certain dentists with expertise in sleep medicine.  These are primarily of use in non-obese patients with mild and moderate disease.     c.  An ENT consultation which may be useful to look for specific causes of obstruction and possible treatment options.     d.  If patient is intolerant to PAP therapy, consider referral to ENT for evaluation for hypoglossal nerve stimulator.   3.  Close follow-up is necessary to ensure success with CPAP or oral appliance therapy for maximum benefit.  4.  A follow-up oximetry study on CPAP is recommended to assess the adequacy of therapy and determine the need for supplemental oxygen or the potential need for Bi-level therapy.  An arterial blood gas to determine the adequacy of baseline ventilation and oxygenation should also be considered.  5.  Healthy sleep recommendations include:  adequate nightly sleep (normal 7-9 hrs/night), avoidance of caffeine after noon and alcohol near bedtime, and maintaining a sleep environment that is cool, dark and quiet.  6.  Weight loss for overweight patients is recommended.  Even modest amounts of weight loss can significantly improve the severity of sleep apnea.  7.  Snoring recommendations include:  weight loss where appropriate, side sleeping, and avoidance of alcohol before bed.  8.  Operation of motor vehicle should not be performed when sleepy.  Signature: Armanda Magic,  MD; Ruthann Cancer; Diplomat, American Board of Sleep Medicine Electronically Signed: 08/12/2022 10:35:59 AM

## 2022-08-13 ENCOUNTER — Telehealth: Payer: Self-pay

## 2022-08-13 NOTE — Telephone Encounter (Signed)
Left VM for patient to return my call for sleep study results.

## 2022-08-13 NOTE — Telephone Encounter (Signed)
-----   Message from Quintella Reichert, MD sent at 08/12/2022 10:37 AM EDT ----- Please let patient know that they have sleep apnea and recommend treating with CPAP.  Please order an auto CPAP from 4-15cm H2O with heated humidity and mask of choice.  Order overnight pulse ox on CPAP.  Followup with me in 6 weeks.

## 2022-08-29 ENCOUNTER — Other Ambulatory Visit: Payer: Self-pay | Admitting: Family Medicine

## 2022-08-29 NOTE — Telephone Encounter (Signed)
Refill request for traMADol HCL 50MG  TABLET   LOV - 03/18/22 Next OV - not scheduled Last refill - 06/01/22 #180/2

## 2022-08-31 NOTE — Telephone Encounter (Signed)
Sent. Thanks.   

## 2022-10-03 ENCOUNTER — Encounter: Payer: Self-pay | Admitting: Family Medicine

## 2022-10-03 ENCOUNTER — Telehealth: Payer: Self-pay | Admitting: *Deleted

## 2022-10-03 NOTE — Telephone Encounter (Signed)
-----   Message from Armanda Magic sent at 08/12/2022 10:37 AM EDT ----- Please let patient know that they have sleep apnea and recommend treating with CPAP.  Please order an auto CPAP from 4-15cm H2O with heated humidity and mask of choice.  Order overnight pulse ox on CPAP.  Followup with me in 6 weeks.

## 2022-10-03 NOTE — Telephone Encounter (Signed)
The patient has been notified of the result. [name], CMA 10/03/2022 4:08 PM

## 2022-10-07 NOTE — Telephone Encounter (Signed)
The patient has been notified of the result and verbalized understanding.  All questions (if any) were answered. [name], CMA 10/07/2022 2:42 PM    Patient states he wants to try the oral device and he will speak to his dentist concerning oral device.

## 2022-11-20 ENCOUNTER — Encounter: Payer: Self-pay | Admitting: Family Medicine

## 2022-11-20 ENCOUNTER — Telehealth: Payer: Self-pay | Admitting: Family Medicine

## 2022-11-20 NOTE — Telephone Encounter (Signed)
Spoke with patient and gave Dr. Lianne Bushy recommendations.

## 2022-11-20 NOTE — Telephone Encounter (Signed)
Patient called in and stated that he has diverticulitis. He stated that he had an episode with it where he had some bad diarrhea. He stated that he started feeling better yesterday and then last night it came back. He stated that it was so bad that the EMS had to come out last night but his vitals where fine and they informed him to take gas-x. He stated that he is still experiencing some diarrhea but drinking propel water. He was wanting to know if he should be doing something in particular. Please advise. Thank you!

## 2022-11-20 NOTE — Telephone Encounter (Signed)
Rec: rest, clear liquids and update [place] tomorrow if sx continue. Thanks.

## 2022-11-20 NOTE — Telephone Encounter (Signed)
Please triage patient about pain, current symptoms.  Please let me know.  Thanks.

## 2022-11-20 NOTE — Telephone Encounter (Addendum)
Pt said starting on 11/18/22 had upper mid sharp abd pain and there was a hard place like knot in middle of upper stomach. Explosive diarrhea started on 11/19/22.pt  pt said diarrhea is watery but no blood or mucus. Pt said in last 12 hours had 3 - 4 watery diarrhea. Last time had diarrhea was 11/20/22 at 11 AM.no vomiting. No fever. Pt said has been resting this afternoon; pt has ate toast and drank propel and water. Pt said right now he is not having any abd pain and no diarrhea. Pt said at this moment he feels fairly good. Pt said the hard place in upper stomach is also gone.  [facility]. Pr request cb after reviewed by Dr Para March. Sending note to Dr Para March and  Para March pool.

## 2022-11-20 NOTE — Telephone Encounter (Signed)
Spoke with pt relaying Dr. Lianne Bushy message.  Pt verbalizes understanding and expresses his thanks.

## 2022-11-21 ENCOUNTER — Ambulatory Visit (INDEPENDENT_AMBULATORY_CARE_PROVIDER_SITE_OTHER): Payer: Medicare Other | Admitting: Family

## 2022-11-21 ENCOUNTER — Encounter: Payer: Self-pay | Admitting: Family

## 2022-11-21 ENCOUNTER — Telehealth: Payer: Self-pay | Admitting: Family Medicine

## 2022-11-21 VITALS — BP 128/78 | HR 80 | Temp 98.4°F | Ht 67.5 in | Wt 166.0 lb

## 2022-11-21 DIAGNOSIS — K5792 Diverticulitis of intestine, part unspecified, without perforation or abscess without bleeding: Secondary | ICD-10-CM

## 2022-11-21 DIAGNOSIS — R1084 Generalized abdominal pain: Secondary | ICD-10-CM | POA: Diagnosis not present

## 2022-11-21 LAB — COMPREHENSIVE METABOLIC PANEL
ALT: 11 U/L (ref 0–53)
AST: 18 U/L (ref 0–37)
Albumin: 4.2 g/dL (ref 3.5–5.2)
Alkaline Phosphatase: 64 U/L (ref 39–117)
BUN: 11 mg/dL (ref 6–23)
CO2: 27 mEq/L (ref 19–32)
Calcium: 9.1 mg/dL (ref 8.4–10.5)
Chloride: 100 mEq/L (ref 96–112)
Creatinine, Ser: 0.92 mg/dL (ref 0.40–1.50)
GFR: 80.28 mL/min (ref >60.00)
Glucose, Bld: 95 mg/dL (ref 70–99)
Potassium: 3.8 mEq/L (ref 3.5–5.1)
Sodium: 136 mEq/L (ref 135–145)
Total Bilirubin: 0.9 mg/dL (ref 0.2–1.2)
Total Protein: 7.2 g/dL (ref 6.0–8.3)

## 2022-11-21 LAB — CBC WITH DIFFERENTIAL/PLATELET
Basophils Absolute: 0 10*3/uL (ref 0.0–0.1)
Basophils Relative: 0.6 % (ref 0.0–3.0)
Eosinophils Absolute: 0.1 10*3/uL (ref 0.0–0.7)
Eosinophils Relative: 1 % (ref 0.0–5.0)
HCT: 43.6 % (ref 39.0–52.0)
Hemoglobin: 14 g/dL (ref 13.0–17.0)
Lymphocytes Relative: 21.4 % (ref 12.0–46.0)
Lymphs Abs: 1.7 10*3/uL (ref 0.7–4.0)
MCHC: 32.1 g/dL (ref 30.0–36.0)
MCV: 89.2 fl (ref 78.0–100.0)
Monocytes Absolute: 0.7 10*3/uL (ref 0.1–1.0)
Monocytes Relative: 8.8 % (ref 3.0–12.0)
Neutro Abs: 5.4 10*3/uL (ref 1.4–7.7)
Neutrophils Relative %: 68.2 % (ref 43.0–77.0)
Platelets: 337 10*3/uL (ref 150.0–400.0)
RBC: 4.89 Mil/uL (ref 4.22–5.81)
RDW: 13.6 % (ref 11.5–15.5)
WBC: 7.9 10*3/uL (ref 4.0–10.5)

## 2022-11-21 MED ORDER — METRONIDAZOLE 500 MG PO TABS: 500.0000 mg | ORAL_TABLET | Freq: Two times a day (BID) | ORAL | 0 refills | Status: DC

## 2022-11-21 MED ORDER — CIPROFLOXACIN HCL 500 MG PO TABS: 500.0000 mg | ORAL_TABLET | Freq: Two times a day (BID) | ORAL | 0 refills | Status: AC

## 2022-11-21 NOTE — Progress Notes (Signed)
Acute Office Visit  Subjective:     Patient ID: Keith Wilks., male    DOB: August 03, 1945, 77 y.o.   MRN: [id number]  Chief Complaint  Patient presents with  . Diarrhea    Patient had diarrhea since Tuesday evening.  Abdominal pain, dry heaves, gas Patient had left over prescription of Cipro, he took one pill last night and one this morning.      HPI 77 year old male patient of Dr. Para March presents today with concerns of abdominal pain, nausea, vomiting and diarrhea.  Symptoms initially started Sunday when he did not have a bowel movement by Monday he reports that he was having diarrhea and abdominal pain.  On Wednesday he called EMS due to the severity of the pain who checked his vital signs and they were stable.  He did not go to the emergency department.  He did increase his intake of water and ginger ale which he believes caused him to have more diarrhea.  He noticed that he had 2 tablets of ciprofloxacin 500 mg on yesterday and he took those 2 tablets.  This morning, he feels much better.  Denies any abdominal pain and diarrhea has resolved thus far.  Patient has a history of diverticulosis and diverticulitis.  Denies any black stools or blood in his stools.  Review of Systems  Respiratory: Negative.    Cardiovascular: Negative.   Gastrointestinal:  Positive for diarrhea, nausea and vomiting. Negative for blood in stool and melena.  Genitourinary: Negative.   Musculoskeletal: Negative.   Skin: Negative.   Neurological: Negative.   Endo/Heme/Allergies: Negative.   Psychiatric/Behavioral: Negative.    All other systems reviewed and are negative.  Past Medical History:  Diagnosis Date  . Adjustment reaction with physical symptoms   . Anxiety   . Cancer, skin, squamous cell    R shoulder, removed per derm 2012 and chest  . Coronary artery disease    a. 05/2015 c/p post-op surgery; b. 07/2015 MV EF 53%, basal inferoseptal, mid inferoseptal, and apical inferior mild ischemia;  c.  08/2015 Cath/PCI: LM nl, LAD 91m (3.5x20 Promus DES), 9m, LCX nl, RCA nl, EF 60%.  . Diverticulosis 2018   Noted on colonoscopy  . Ear ringing    Left  . Elevated PSA    h/o with normal bx per URO  . GERD (gastroesophageal reflux disease)   . Hearing loss    Slight left ear  . History of BPH   . History of kidney stones   . Hydronephrosis    Left, urteral obstruction, 2-3 cm UVJ  . Hyperlipidemia   . Hypertension   . Nephrolithiasis   . OA (osteoarthritis)    with r hip replacement  . Osteoarthrosis involving, or with mention of more than one site, but not specified as generalized, site unspecified(715.80)   . Prostate cancer (HCC)   . Restless legs syndrome (RLS)   . Ruptured lumbar disc   . Syncope    negative cardiac w/u per Dr Eldridge Dace  . Tick bite 2011   likely tick associated illness, treated with doxy with resolution,  . Viral exanthem, unspecified     Social History   Socioeconomic History  . Marital status: Married    Spouse name: Not on file  . Number of children: Not on file  . Years of education: Not on file  . Highest education level: Not on file  Occupational History  . Occupation: Garment/textile technologist: [employer]  Tobacco Use  . Smoking status: Former    Current packs/day: 0.00    Types: Cigarettes    Quit date: 04/12/1984    Years since quitting: 38.6  . Smokeless tobacco: Never  Vaping Use  . Vaping status: Never Used  Substance and Sexual Activity  . Alcohol use: No    Alcohol/week: 0.0 standard drinks of alcohol  . Drug use: No  . Sexual activity: Yes  Other Topics Concern  . Not on file  Social History Narrative   Went to [place], previous [employer] [place], [place], [place]   Distant smoking hx, quit 10+ years ago   Regular exercise: yes   Father and Mother lived with him and his family until they needed NH placement- 01-28-2011   Father died late 01-28-2011 with dementia.     Retired.     His youngest daughter is deceased   Social  Determinants of Corporate investment banker Strain: Low Risk  (03/18/2022)   Overall Financial Resource Strain (CARDIA)   . Difficulty of Paying Living Expenses: Not hard at all  Food Insecurity: No Food Insecurity (03/18/2022)   Hunger Vital Sign   . Worried About Programme researcher, broadcasting/film/video in the Last Year: Never true   . Ran Out of Food in the Last Year: Never true  Transportation Needs: No Transportation Needs (03/18/2022)   PRAPARE - Transportation   . Lack of Transportation (Medical): No   . Lack of Transportation (Non-Medical): No  Physical Activity: Sufficiently Active (03/18/2022)   Exercise Vital Sign   . Days of Exercise per Week: 3 days   . Minutes of Exercise per Session: 60 min  Stress: No Stress Concern Present (03/18/2022)   [employer] of Occupational Health - Occupational Stress Questionnaire   . Feeling of Stress : Not at all  Social Connections: Moderately Integrated (03/18/2022)   Social Connection and Isolation Panel [NHANES]   . Frequency of Communication with Friends and Family: More than three times a week   . Frequency of Social Gatherings with Friends and Family: Once a week   . Attends Religious Services: More than 4 times per year   . Active Member of Clubs or Organizations: No   . Attends Banker Meetings: Never   . Marital Status: Married  Catering manager Violence: Not At Risk (03/18/2022)   Humiliation, Afraid, Rape, and Kick questionnaire   . Fear of Current or Ex-Partner: No   . Emotionally Abused: No   . Physically Abused: No   . Sexually Abused: No    Past Surgical History:  Procedure Laterality Date  . ACHILLES TENDON REPAIR Left 1984  . CARDIAC CATHETERIZATION N/A 08/08/2015   Procedure: Left Heart Cath and Coronary Angiography;  Surgeon: Jake Bathe, MD;  Location: [facility] INVASIVE CV LAB;  Service: Cardiovascular;  Laterality: N/A;  . CARDIAC CATHETERIZATION N/A 08/08/2015   Procedure: Coronary Stent Intervention;  Surgeon: Corky Crafts, MD;  Location[facility] INVASIVE CV LAB;  Service: Cardiovascular;  Laterality: N/A;  . COLONOSCOPY  2017/01/27  . CORONARY STENT PLACEMENT  08/08/2015   MID LAD WITH DES  . JOINT REPLACEMENT    . SHOULDER ARTHROSCOPY  January 28, 2011   R rotator cuff (biceps tear not repaired)  . SKIN CANCER EXCISION     right shoulder and mid chest  . TOTAL HIP ARTHROPLASTY     right  . TOTAL HIP ARTHROPLASTY Left 05/02/2015   Procedure: TOTAL LEFT HIP ARTHROPLASTY ANTERIOR APPROACH;  Surgeon: Homero Fellers  Aluisio, MD;  Location: [facility] ORS;  Service: Orthopedics;  Laterality: Left;  . TOTAL SHOULDER ARTHROPLASTY Left 03/18/2018   Procedure: TOTAL SHOULDER ARTHROPLASTY;  Surgeon: Francena Hanly, MD;  Location: [facility] ORS;  Service: Orthopedics;  Laterality: Left;     Family History  Problem Relation Age of Onset  . Alzheimer's disease Father   . Arrhythmia Mother   . Prostate cancer Maternal Uncle   . Colon cancer Neg Hx   . Heart attack Neg Hx   . Hypertension Neg Hx     Allergies  Allergen Reactions  . Xarelto [Rivaroxaban] Rash    Rash and itching with blisters.  . Alprazolam Other (See Comments)    abnormal dreams  . Benadryl Allergy [Diphenhydramine Hcl]     Mood change  . Doxycycline Other (See Comments)    GI upset  . Haldol [Haloperidol] Other (See Comments)    Worsens restless leg syndrome  . Lactose Intolerance (Gi)   . Lipitor [Atorvastatin] Other (See Comments)    Joints ached  . Melatonin     Worse RLS symptoms  . Morphine And Codeine Other (See Comments)    Abdominal bloating  . Tamsulosin Other (See Comments)    Malaise/ lethargy  . Zolpidem Tartrate Other (See Comments)    Terrible dreams   . Crestor [Rosuvastatin Calcium] Other (See Comments)    Paranoid   . Zetia [Ezetimibe] Diarrhea    Current Outpatient Medications on File Prior to Visit  Medication Sig Dispense Refill  . Alcaftadine 0.25 % SOLN Place 1 drop into both eyes daily as needed (LASTACAFT - itchy eyes).     Marland Kitchen  amLODipine (NORVASC) 10 MG tablet Take 1 tablet (10 mg total) by mouth daily. 90 tablet 3  . clopidogrel (PLAVIX) 75 MG tablet Take 1 tablet (75 mg total) by mouth daily with breakfast. 90 tablet 3  . diazepam (VALIUM) 5 MG tablet TAKE 1 TO 2 TABLETS BY  MOUTH EVERY 12 HOURS AS  NEEDED FOR ANXIETY AND FOR  INSOMNIA . SEDATION CAUTION 90 tablet 0  . Homeopathic Products (THERAWORX RELIEF) FOAM     . hydrocortisone cream 1 % Apply 1 application topically daily as needed for itching.    . Lidocaine HCl-Benzyl Alcohol (SALONPAS LIDOCAINE PLUS) 4-10 % CREA Apply topically as needed.    . nitroGLYCERIN (NITROSTAT) 0.4 MG SL tablet Place 1 tablet (0.4 mg total) under the tongue every 5 (five) minutes as needed for chest pain. 25 tablet 4  . pantoprazole (PROTONIX) 40 MG tablet Take 1 tablet (40 mg total) by mouth daily. 90 tablet 3  . REPATHA SURECLICK 140 MG/ML SOAJ ADMINISTER 1 ML UNDER THE SKIN EVERY 14 DAYS 6 mL 3  . rOPINIRole (REQUIP) 0.5 MG tablet Take 1 tablet (0.5 mg total) by mouth at bedtime. 90 tablet 3  . SIMPLY SALINE NA Place 1 spray into both nostrils 2 (two) times daily.     . traMADol (ULTRAM) 50 MG tablet TAKE 1 TO 2 TABLETS BY MOUTH EVERY 8 HOURS AS NEEDED FOR PAIN 180 tablet 2   No current facility-administered medications on file prior to visit.    BP 128/78   Pulse 80   Temp 98.4 F (36.9 C) (Temporal)   Ht 5' 7.5" (1.715 m)   Wt 166 lb (75.3 kg)   SpO2 98%   BMI 25.62 kg/m chart     Objective:    BP 128/78   Pulse 80   Temp 98.4 F (36.9 C) (  Temporal)   Ht 5' 7.5" (1.715 m)   Wt 166 lb (75.3 kg)   SpO2 98%   BMI 25.62 kg/m    Physical Exam Vitals and nursing note reviewed.  Constitutional:      Appearance: Normal appearance. He is normal weight.  HENT:     Mouth/Throat:     Mouth: Mucous membranes are moist.  Cardiovascular:     Rate and Rhythm: Normal rate and regular rhythm.  Pulmonary:     Effort: Pulmonary effort is normal.     Breath sounds:  Normal breath sounds.  Abdominal:     General: Abdomen is flat. Bowel sounds are normal.     Palpations: Abdomen is soft.     Tenderness: There is no abdominal tenderness. There is no guarding or rebound.  Musculoskeletal:        General: Normal range of motion.     Cervical back: Normal range of motion and neck supple.  Skin:    General: Skin is warm and dry.  Neurological:     General: No focal deficit present.     Mental Status: He is alert and oriented to person, place, and time.  Psychiatric:        Mood and Affect: Mood normal.        Behavior: Behavior normal.   No results found for any visits on 11/21/22.      Assessment & Plan:   Problem List Items Addressed This Visit   None Visit Diagnoses     Diverticulitis    -  Primary   Relevant Orders   CBC w/Diff   CMP   Abdominal pain, generalized       Relevant Orders   CBC w/Diff   CMP       Meds ordered this encounter  Medications  . ciprofloxacin (CIPRO) 500 MG tablet    Sig: Take 1 tablet (500 mg total) by mouth 2 (two) times daily for 10 days.    Dispense:  20 tablet    Refill:  0  . metroNIDAZOLE (FLAGYL) 500 MG tablet    Sig: Take 1 tablet (500 mg total) by mouth 2 (two) times daily.    Dispense:  20 tablet    Refill:  0   Labs obtained today.  Will notify patient pending results.  Suspect diverticulitis and will treat with Cipro and Flagyl.  Call the office if symptoms worsen or persist.  Recheck as scheduled and sooner as needed. No follow-ups on file.  Eulis Foster, FNP

## 2022-11-21 NOTE — Telephone Encounter (Signed)
French Ana RN with access nurse called to request appt within 4 hrs to see provider. Pt scheduled 11/21/22 at 10:45 with Worthy Rancher FNP for diarrhea at [facility]. UC & ED precautions given and pt voiced understanding. Pt will be at [facility] at 10:30 for check in. When access nurse record is available I will add to this note. Sending note to Worthy Rancher FNP. Thank  you.

## 2022-11-21 NOTE — Telephone Encounter (Signed)
Marland Kitchen

## 2022-11-21 NOTE — Telephone Encounter (Signed)
FYI: This call has been transferred to Access Nurse. Once the result note has been entered staff can address the message at that time.  Patient called in with the following symptoms:  Red Word: Ongoing diarrhea, dry heaving, unable to keep fluids in   Please advise at Mobile [phone] (mobile)  Message is routed to Provider Pool and [facility] Triage

## 2022-11-22 ENCOUNTER — Encounter: Payer: Self-pay | Admitting: Cardiology

## 2022-11-22 NOTE — Telephone Encounter (Signed)
Late entry, called patient 11/21/22.  D/w pt about OV here, plan for abx, routine cautions d/w pt.  Would defer probiotic at this point so as not to introduce new variables.  He can update me as needed.  He agrees to plan.  I thank all involved.

## 2022-11-25 ENCOUNTER — Telehealth: Payer: Self-pay | Admitting: Family Medicine

## 2022-11-25 NOTE — Telephone Encounter (Signed)
Pt called returning Jessica's call regarding results. Told Webb's response regarding results. Pt had no questions/concerns. Call back # [phone]

## 2022-11-28 ENCOUNTER — Other Ambulatory Visit: Payer: Self-pay | Admitting: Family Medicine

## 2022-11-28 NOTE — Telephone Encounter (Signed)
Refill request for Tramadol 50 mg tabs  LOV - 11/21/22 w/ Worthy Rancher, NP Next OV - not scheduled Last refill - 08/31/22 #180/2

## 2022-12-02 ENCOUNTER — Encounter: Payer: Self-pay | Admitting: Family Medicine

## 2022-12-05 ENCOUNTER — Encounter: Payer: Self-pay | Admitting: Family Medicine

## 2022-12-08 ENCOUNTER — Encounter: Payer: Self-pay | Admitting: Family Medicine

## 2022-12-12 ENCOUNTER — Encounter: Payer: Self-pay | Admitting: Family Medicine

## 2022-12-18 ENCOUNTER — Encounter: Payer: Self-pay | Admitting: Family Medicine

## 2022-12-18 DIAGNOSIS — K5792 Diverticulitis of intestine, part unspecified, without perforation or abscess without bleeding: Secondary | ICD-10-CM

## 2022-12-19 ENCOUNTER — Encounter: Payer: Self-pay | Admitting: Family Medicine

## 2022-12-19 MED ORDER — AMOXICILLIN-POT CLAVULANATE 875-125 MG PO TABS: 1.0000 | ORAL_TABLET | Freq: Two times a day (BID) | ORAL | 0 refills | Status: DC

## 2022-12-19 NOTE — Telephone Encounter (Addendum)
This has already been addressed in another message.

## 2022-12-19 NOTE — Telephone Encounter (Signed)
Patient called in to follow up on this message.

## 2022-12-19 NOTE — Telephone Encounter (Signed)
Patient called in to follow up on this message. He was wanting to know if another prescription could be sent in or what he should do. Please advise. Thank you!

## 2022-12-22 ENCOUNTER — Encounter: Payer: Self-pay | Admitting: Family Medicine

## 2022-12-23 ENCOUNTER — Encounter: Payer: Self-pay | Admitting: Family Medicine

## 2022-12-24 ENCOUNTER — Encounter: Payer: Self-pay | Admitting: *Deleted

## 2022-12-24 ENCOUNTER — Encounter: Payer: Self-pay | Admitting: Cardiology

## 2022-12-24 ENCOUNTER — Encounter: Payer: Self-pay | Admitting: Family Medicine

## 2023-02-06 ENCOUNTER — Encounter: Payer: Self-pay | Admitting: Family Medicine

## 2023-02-06 NOTE — Telephone Encounter (Signed)
Flu vaccine has been updated. See patient message regarding Pneumonia vaccine

## 2023-02-16 ENCOUNTER — Other Ambulatory Visit: Payer: Self-pay | Admitting: Cardiology

## 2023-02-16 DIAGNOSIS — E785 Hyperlipidemia, unspecified: Secondary | ICD-10-CM

## 2023-02-16 DIAGNOSIS — I2510 Atherosclerotic heart disease of native coronary artery without angina pectoris: Secondary | ICD-10-CM

## 2023-02-19 ENCOUNTER — Telehealth: Payer: Self-pay | Admitting: *Deleted

## 2023-02-19 NOTE — Telephone Encounter (Signed)
   Pre-operative Risk Assessment    Patient Name: [name].  DOB: Dec 04, 1945 MRN: [id number]      Request for Surgical Clearance    Procedure:   COLONOSCOPY  Date of Surgery:  Clearance 04/09/23                                 Surgeon:  DR. Levora Angel Surgeon's Group or Practice Name:  [facility] Phone number:  [phone] Fax number:  [phone]   Type of Clearance Requested:   - Medical  - Pharmacy:  Hold Apixaban (Eliquis) X'S 5 DAYS   Type of Anesthesia:  Not Indicated   Additional requests/questions:    Wilhemina Cash   02/19/2023, 9:06 AM

## 2023-02-19 NOTE — Telephone Encounter (Signed)
   Name: [name].  DOB: [birth year]  MRN: [id number]  Primary Cardiologist: Donato Schultz, MD   Preoperative team, please contact this patient and set up a phone call appointment for further preoperative risk assessment. Please obtain consent and complete medication review. Thank you for your help.  I confirm that guidance regarding antiplatelet and oral anticoagulation therapy has been completed and, if necessary, noted below.  Patient can hold Plavix 5 days prior to procedure and should restart postprocedure when surgically safe.  I also confirmed the patient resides in the state of [place]. As per [facility] Medical Board telemedicine laws, the patient must reside in the state in which the provider is licensed.   Napoleon Form, Leodis Rains, NP 02/19/2023, 9:55 AM [facility]

## 2023-02-19 NOTE — Telephone Encounter (Signed)
1st attempt to reach pt to schedule tele visit. Lvm  

## 2023-02-23 NOTE — Telephone Encounter (Signed)
Lvm for pt to call office to schedule pre op tele visit.

## 2023-02-26 NOTE — Telephone Encounter (Signed)
CORRECTION ON CLEARANCE REQUEST; WAS ENTERED AS TO HOLD ELIQUIS; PT IS ON PLAVIX NOT ELIQUIS; see notes from preop APP in regard to hold time for Plavix.   3rd attempt was made today to reach the pt to schedule a tele preop appt. I will update the requesting office the pt needs to call back to schedule a tele preop appt. I will remove from the preop callback pool until the pt calls back.

## 2023-03-02 ENCOUNTER — Other Ambulatory Visit: Payer: Self-pay | Admitting: Family Medicine

## 2023-03-02 ENCOUNTER — Telehealth: Payer: Self-pay | Admitting: Cardiology

## 2023-03-02 ENCOUNTER — Encounter: Payer: Self-pay | Admitting: Family Medicine

## 2023-03-02 ENCOUNTER — Telehealth: Payer: Self-pay | Admitting: *Deleted

## 2023-03-02 NOTE — Telephone Encounter (Signed)
LAST APPOINTMENT DATE: 03/18/22 rt hip pain    NEXT APPOINTMENT DATE: n/a    LAST REFILL: 11/30/22  QTY: #180 2 rf

## 2023-03-02 NOTE — Telephone Encounter (Signed)
Patient is returning CMA's call to setup tele-visit for clearance. Please advise.

## 2023-03-02 NOTE — Telephone Encounter (Signed)
Ann Held N26 minutes ago (1:38 PM)   [facility] Patient is returning CMA's call to setup tele-visit for clearance. Please advise.       Note   Cortrell, Angelo [phone]  Brooks Sailors

## 2023-03-02 NOTE — Telephone Encounter (Signed)
Pt has been scheduled tele preop appt 03/18/23. Med rec and consent are done.

## 2023-03-02 NOTE — Telephone Encounter (Signed)
Pt has been scheduled tele preop appt 03/18/23. Med rec and consent are done.     Patient Consent for Virtual Visit        [name]. has provided verbal consent on 03/02/2023 for a virtual visit (video or telephone).   CONSENT FOR VIRTUAL VISIT FOR:  [name].  By participating in this virtual visit I agree to the following:  I hereby voluntarily request, consent and authorize [facility] and its employed or contracted physicians, physician assistants, nurse practitioners or other licensed health care professionals (the Practitioner), to provide me with telemedicine health care services (the "Services") as deemed necessary by the treating Practitioner. I acknowledge and consent to receive the Services by the Practitioner via telemedicine. I understand that the telemedicine visit will involve communicating with the Practitioner through live audiovisual communication technology and the disclosure of certain medical information by electronic transmission. I acknowledge that I have been given the opportunity to request an in-person assessment or other available alternative prior to the telemedicine visit and am voluntarily participating in the telemedicine visit.  I understand that I have the right to withhold or withdraw my consent to the use of telemedicine in the course of my care at any time, without affecting my right to future care or treatment, and that the Practitioner or I may terminate the telemedicine visit at any time. I understand that I have the right to inspect all information obtained and/or recorded in the course of the telemedicine visit and may receive copies of available information for a reasonable fee.  I understand that some of the potential risks of receiving the Services via telemedicine include:  Delay or interruption in medical evaluation due to technological equipment failure or disruption; Information transmitted may not be sufficient (e.g. poor  resolution of images) to allow for appropriate medical decision making by the Practitioner; and/or  In rare instances, security protocols could fail, causing a breach of personal health information.  Furthermore, I acknowledge that it is my responsibility to provide information about my medical history, conditions and care that is complete and accurate to the best of my ability. I acknowledge that Practitioner's advice, recommendations, and/or decision may be based on factors not within their control, such as incomplete or inaccurate data provided by me or distortions of diagnostic images or specimens that may result from electronic transmissions. I understand that the practice of medicine is not an exact science and that Practitioner makes no warranties or guarantees regarding treatment outcomes. I acknowledge that a copy of this consent can be made available to me via my patient portal [facility] MyChart), or I can request a printed copy by calling the office of [facility].    I understand that my insurance will be billed for this visit.   I have read or had this consent read to me. I understand the contents of this consent, which adequately explains the benefits and risks of the Services being provided via telemedicine.  I have been provided ample opportunity to ask questions regarding this consent and the Services and have had my questions answered to my satisfaction. I give my informed consent for the services to be provided through the use of telemedicine in my medical care

## 2023-03-03 MED ORDER — DIAZEPAM 5 MG PO TABS: ORAL_TABLET | ORAL | 0 refills | Status: AC

## 2023-03-12 ENCOUNTER — Encounter: Payer: Self-pay | Admitting: Cardiology

## 2023-03-13 NOTE — Telephone Encounter (Signed)
 I have left a message and sent my chart to pt.

## 2023-03-13 NOTE — Telephone Encounter (Signed)
 Left message for the pt to call back and we can reschedule his tele appt as he has an appt with PCP the morning of his tele appt, see MY CHART message.

## 2023-03-18 ENCOUNTER — Ambulatory Visit: Payer: Medicare Other

## 2023-03-18 VITALS — Ht 67.0 in | Wt 155.0 lb

## 2023-03-18 DIAGNOSIS — Z0000 Encounter for general adult medical examination without abnormal findings: Secondary | ICD-10-CM

## 2023-03-18 NOTE — Patient Instructions (Signed)
 [name] , Thank you for taking time to come for your Medicare Wellness Visit. I appreciate your ongoing commitment to your health goals. Please review the following plan we discussed and let me know if I can assist you in the future.   Referrals/Orders/Follow-Ups/Clinician Recommendations: none  This is a list of the screening recommended for you and due dates:  Health Maintenance  Topic Date Due   COVID-19 Vaccine (6 - 2024-25 season) 02/06/2023   Medicare Annual Wellness Visit  03/17/2024   DTaP/Tdap/Td vaccine (3 - Td or Tdap) 12/19/2026   Pneumonia Vaccine  Completed   Flu Shot  Completed   Hepatitis C Screening  Completed   Zoster (Shingles) Vaccine  Completed   HPV Vaccine  Aged Out   Colon Cancer Screening  Discontinued    Advanced directives: (In Chart) A copy of your advanced directives are scanned into your chart should your provider ever need it.  Next Medicare Annual Wellness Visit scheduled for next year: Yes 03/18/2024 @ 8:50am televisit

## 2023-03-18 NOTE — Progress Notes (Signed)
 Subjective:   Keith Poppleton. is a 78 y.o. male who presents for Medicare Annual/Subsequent preventive examination.  Visit Complete: Virtual I connected with  Keith C Hashem Jr. on 03/18/23 by a audio enabled telemedicine application and verified that I am speaking with the correct person using two identifiers.  Patient Location: Home  Provider Location: Home Office  I discussed the limitations of evaluation and management by telemedicine. The patient expressed understanding and agreed to proceed.  Vital Signs: Because this visit was a virtual/telehealth visit, some criteria may be missing or patient reported. Any vitals not documented were not able to be obtained and vitals that have been documented are patient reported.  Patient Medicare AWV questionnaire was completed by the patient on (not done); I have confirmed that all information answered by patient is correct and no changes since this date.  Cardiac Risk Factors include: advanced age (>66men, >12 women);dyslipidemia;hypertension;male gender     Objective:    Today's Vitals   03/18/23 0853  Weight: 155 lb (70.3 kg)  Height: 5\' 7"  (1.702 m)  PainSc: 0-No pain   Body mass index is 24.28 kg/m.     03/18/2023    9:16 AM 03/18/2022   11:09 AM 10/19/2020    6:17 PM 01/04/2019    3:39 PM 03/18/2018   10:45 AM 03/15/2018   11:23 AM 12/10/2016    1:40 PM  Advanced Directives  Does Patient Have a Medical Advance Directive? Yes Yes Yes Yes Yes Yes Yes  Type of Estate agent of [place];Living will Healthcare Power of [place];Living will Living will;Healthcare Power of [employer] Power of [place];Living will Healthcare Power of [place];Living will Healthcare Power of [place];Living will Healthcare Power of [place];Living will  Does patient want to make changes to medical advance directive?  No - Patient declined   No - Patient declined No - Patient declined   Copy of Healthcare Power of Attorney  in Chart? Yes - validated most recent copy scanned in chart (See row information) Yes - validated most recent copy scanned in chart (See row information)  No - copy requested No - copy requested No - copy requested No - copy requested    Current Medications (verified) Outpatient Encounter Medications as of 03/18/2023  Medication Sig   amLODipine  (NORVASC ) 10 MG tablet Take 1 tablet (10 mg total) by mouth daily.   clopidogrel  (PLAVIX ) 75 MG tablet Take 1 tablet (75 mg total) by mouth daily with breakfast.   diazepam  (VALIUM ) 5 MG tablet TAKE 1 TO 2 TABLETS BY  MOUTH EVERY 12 HOURS AS  NEEDED FOR ANXIETY AND FOR  INSOMNIA, sedation caution   Homeopathic Products (THERAWORX RELIEF) FOAM    hydrocortisone cream 1 % Apply 1 application topically daily as needed for itching.   Lidocaine  HCl-Benzyl Alcohol (SALONPAS LIDOCAINE  PLUS) 4-10 % CREA Apply topically as needed.   nitroGLYCERIN  (NITROSTAT ) 0.4 MG SL tablet Place 1 tablet (0.4 mg total) under the tongue every 5 (five) minutes as needed for chest pain.   pantoprazole  (PROTONIX ) 40 MG tablet Take 1 tablet (40 mg total) by mouth daily.   REPATHA  SURECLICK 140 MG/ML SOAJ ADMINISTER 1 ML UNDER THE SKIN EVERY 14 DAYS   rOPINIRole  (REQUIP ) 0.5 MG tablet Take 1 tablet (0.5 mg total) by mouth at bedtime.   SIMPLY SALINE NA Place 1 spray into both nostrils 2 (two) times daily.    traMADol  (ULTRAM ) 50 MG tablet TAKE 1 TO 2 TABLETS BY MOUTH EVERY 8 HOURS  AS NEEDED FOR PAIN   White Petrolatum-Mineral Oil (ARTIFICIAL TEARS) ointment Place 1 drop into both eyes daily.   Alcaftadine 0.25 % SOLN Place 1 drop into both eyes daily as needed (LASTACAFT - itchy eyes).  (Patient not taking: Reported on 03/02/2023)   amoxicillin -clavulanate (AUGMENTIN ) 875-125 MG tablet Take 1 tablet by mouth 2 (two) times daily. (Patient not taking: Reported on 03/18/2023)   No facility-administered encounter medications on file as of 03/18/2023.    Allergies (verified) Xarelto   [rivaroxaban ], Alprazolam, Benadryl  allergy [diphenhydramine  hcl], Doxycycline , Haldol [haloperidol], Lactose intolerance (gi), Lipitor [atorvastatin ], Melatonin, Morphine  and codeine, Tamsulosin , Zolpidem tartrate, Crestor  [rosuvastatin  calcium ], and Zetia  [ezetimibe ]   History: Past Medical History:  Diagnosis Date   Adjustment reaction with physical symptoms    Anxiety    Cancer, skin, squamous cell    R shoulder, removed per derm 2012 and chest   Coronary artery disease    a. 05/2015 c/p post-op surgery; b. 07/2015 MV EF 53%, basal inferoseptal, mid inferoseptal, and apical inferior mild ischemia;  c. 08/2015 Cath/PCI: LM nl, LAD 69m (3.5x20 Promus DES), 72m, LCX nl, RCA nl, EF 60%.   Diverticulosis 2018   Noted on colonoscopy   Ear ringing    Left   Elevated PSA    h/o with normal bx per URO   GERD (gastroesophageal reflux disease)    Hearing loss    Slight left ear   History of BPH    History of kidney stones    Hydronephrosis    Left, urteral obstruction, 2-3 cm UVJ   Hyperlipidemia    Hypertension    Nephrolithiasis    OA (osteoarthritis)    with r hip replacement   Osteoarthrosis involving, or with mention of more than one site, but not specified as generalized, site unspecified(715.80)    Prostate cancer (HCC)    Restless legs syndrome (RLS)    Ruptured lumbar disc    Syncope    negative cardiac w/u per Dr Jacquelynn Matter   Tick bite 2011   likely tick associated illness, treated with doxy with resolution,   Viral exanthem, unspecified    Past Surgical History:  Procedure Laterality Date   ACHILLES TENDON REPAIR Left 1984   CARDIAC CATHETERIZATION N/A 08/08/2015   Procedure: Left Heart Cath and Coronary Angiography;  Surgeon: Hugh Madura, MD;  Location: [facility] INVASIVE CV LAB;  Service: Cardiovascular;  Laterality: N/A;   CARDIAC CATHETERIZATION N/A 08/08/2015   Procedure: Coronary Stent Intervention;  Surgeon: Lucendia Rusk, MD;  Location: [facility] INVASIVE CV LAB;   Service: Cardiovascular;  Laterality: N/A;   CATARACT EXTRACTION, BILATERAL     Left 2024, Right 2023   COLONOSCOPY  2018   CORONARY STENT PLACEMENT  08/08/2015   MID LAD WITH DES   JOINT REPLACEMENT     SHOULDER ARTHROSCOPY  2012   R rotator cuff (biceps tear not repaired)   SKIN CANCER EXCISION     right shoulder and mid chest   TOTAL HIP ARTHROPLASTY     right   TOTAL HIP ARTHROPLASTY Left 05/02/2015   Procedure: TOTAL LEFT HIP ARTHROPLASTY ANTERIOR APPROACH;  Surgeon: Liliane Rei, MD;  Location: [facility] ORS;  Service: Orthopedics;  Laterality: Left;   TOTAL SHOULDER ARTHROPLASTY Left 03/18/2018   Procedure: TOTAL SHOULDER ARTHROPLASTY;  Surgeon: Ellard Gunning, MD;  Location: [facility];  Service: Orthopedics;  Laterality: Left;    Family History  Problem Relation Age of Onset   Alzheimer's disease Father    Arrhythmia Mother  Prostate cancer Maternal Uncle    Colon cancer Neg Hx    Heart attack Neg Hx    Hypertension Neg Hx    Social History   Socioeconomic History   Marital status: Married    Spouse name: Not on file   Number of children: Not on file   Years of education: Not on file   Highest education level: Not on file  Occupational History   Occupation: Garment/textile technologist: [employer]  Tobacco Use   Smoking status: Former    Current packs/day: 0.00    Types: Cigarettes    Quit date: 04/12/1984    Years since quitting: 38.9   Smokeless tobacco: Never  Vaping Use   Vaping status: Never Used  Substance and Sexual Activity   Alcohol use: No    Alcohol/week: 0.0 standard drinks of alcohol   Drug use: No   Sexual activity: Yes  Other Topics Concern   Not on file  Social History Narrative   Went to [place], 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      Retired.     His youngest daughter is deceased   Social Drivers of Corporate investment banker Strain: Low Risk  (03/18/2023)   Overall Financial Resource Strain (CARDIA)    Difficulty of Paying Living Expenses: Not hard at all  Food Insecurity: No Food Insecurity (03/18/2023)   Hunger Vital Sign    Worried About Running Out of Food in the Last Year: Never true    Ran Out of Food in the Last Year: Never true  Transportation Needs: No Transportation Needs (03/18/2023)   PRAPARE - Administrator, Civil Service (Medical): No    Lack of Transportation (Non-Medical): No  Physical Activity: Sufficiently Active (03/18/2023)   Exercise Vital Sign    Days of Exercise per Week: 3 days    Minutes of Exercise per Session: 60 min  Stress: No Stress Concern Present (03/18/2023)   [employer] of Occupational Health - Occupational Stress Questionnaire    Feeling of Stress : Only a little  Social Connections: Socially Integrated (03/18/2023)   Social Connection and Isolation Panel [NHANES]    Frequency of Communication with Friends and Family: More than three times a week    Frequency of Social Gatherings with Friends and Family: Once a week    Attends Religious Services: More than 4 times per year    Active Member of [employer] or Organizations: Yes    Attends Engineer, structural: More than 4 times per year    Marital Status: Married    Tobacco Counseling Counseling given: Not Answered   Clinical Intake:  Pre-visit preparation completed: Yes  Pain : No/denies pain Pain Score: 0-No pain    BMI - recorded: 24.28 Nutritional Status: BMI of 19-24  Normal Nutritional Risks: None Diabetes: No  How often do you need to have someone help you when you read instructions, pamphlets, or other written materials from your doctor or pharmacy?: 1 - Never  Interpreter Needed?: No  Comments: lives with wife Information entered by :: B.Jamieka Royle,LPN   Activities of Daily  Living    03/18/2023    9:16 AM  In your present state of health, do you have any  difficulty performing the following activities:  Hearing? 0  Vision? 0  Difficulty concentrating or making decisions? 0  Walking or climbing stairs? 0  Dressing or bathing? 0  Doing errands, shopping? 0  Preparing Food and eating ? N  Using the Toilet? N  In the past six months, have you accidently leaked urine? N  Do you have problems with loss of bowel control? N  Managing your Medications? N  Managing your Finances? N  Housekeeping or managing your Housekeeping? N    Patient Care Team: Donnie Galea, MD as PCP - General Renna Cary Myrtle Atta, MD as PCP - Cardiology (Cardiology) Pauline Bos, OD as Referring Physician (Optometry) Trent Frizzle, MD as Consulting Physician (Urology)  Indicate any recent Medical Services you may have received from other than [facility] providers in the past year (date may be approximate).     Assessment:   This is a routine wellness examination for Karin.  Hearing/Vision screen Hearing Screening - Comments:: Pt says his hearing is adequate (some reduced from [employer] days) Vision Screening - Comments:: Pt says he has 20/20 vision now after lense surgery Dr Neil Balls Opthalmology   Goals Addressed               This Visit's Progress     COMPLETED: DIET - EAT MORE FRUITS AND VEGETABLES   On track     COMPLETED: Increase water  intake   On track     Starting 12/10/2016, I will continue to drink at least 6-8 glasses of water  daily.       COMPLETED: Patient Stated   On track     01/04/2019, I will maintain and continue medications as prescribed.       Patient Stated (pt-stated)        Pt says he would like to control the diverticulitis symptoms: eliminating food and drink more water        Depression Screen    03/18/2023    9:05 AM 03/18/2022   11:04 AM 03/15/2021   12:03 PM 03/05/2020    3:39 PM 01/04/2019    3:41 PM 12/24/2017   10:00 AM 12/10/2016     1:21 PM  PHQ 2/9 Scores  PHQ - 2 Score 0 1 0 0 0 0 0  PHQ- 9 Score     0  0    Fall Risk    03/18/2023    8:57 AM 03/18/2022   11:10 AM 03/15/2021   12:03 PM 03/05/2020    3:39 PM 01/04/2019    3:41 PM  Fall Risk   Falls in the past year? 0 0 0 0 0  Number falls in past yr: 0 0 0 0 0  Injury with Fall? 0 0 0 0 0  Risk for fall due to : No Fall Risks No Fall Risks No Fall Risks  Medication side effect  Follow up Education provided;Falls prevention discussed Falls prevention discussed;Falls evaluation completed Falls evaluation completed Falls evaluation completed Falls evaluation completed;Falls prevention discussed    MEDICARE RISK AT HOME: Medicare Risk at Home Any stairs in or around the home?: Yes If so, are there any without handrails?: Yes Home free of loose throw rugs in walkways, pet beds, electrical cords, etc?: Yes Adequate lighting in your home to reduce risk of falls?: Yes Life alert?: No Use of a cane, walker or w/c?: No Grab bars in the bathroom?: Yes Shower chair or bench in shower?: Yes Elevated toilet seat or a handicapped toilet?: Yes  TIMED UP  AND GO:  Was the test performed?  No    Cognitive Function:    01/04/2019    3:44 PM 12/10/2016    1:21 PM  MMSE - Mini Mental State Exam  Orientation to time 5 5  Orientation to Place 5 5  Registration 3 3  Attention/ Calculation 5 0  Recall 3 3  Language- name 2 objects  0  Language- repeat 1 1  Language- follow 3 step command  3  Language- read & follow direction  0  Write a sentence  0  Copy design  0  Total score  20        03/18/2023    9:19 AM 03/18/2022   11:13 AM  6CIT Screen  What Year? 0 points 0 points  What month? 0 points 0 points  What time? 0 points 0 points  Count back from 20 0 points 0 points  Months in reverse 0 points 0 points  Repeat phrase 0 points 0 points  Total Score 0 points 0 points    Immunizations Immunization History  Administered Date(s) Administered   Fluad  Quad(high Dose 65+) 01/11/2019, 02/04/2022, 02/05/2023   Hep A / Hep B 03/29/2008, 06/07/2008, 11/27/2008   Influenza Split 12/31/2011, 01/16/2015   Influenza Whole 11/23/2007, 01/10/2010   Influenza, High Dose Seasonal PF 01/07/2020   Influenza,inj,Quad PF,6+ Mos 01/12/2016, 01/03/2017, 01/02/2018, 01/05/2021   Influenza,inj,quad, With Preservative 01/01/2017   Influenza-Unspecified 12/01/2012, 12/15/2013, 01/12/2016   Moderna Covid-19 Fall Seasonal Vaccine 19yrs & older 12/12/2022   PFIZER(Purple Top)SARS-COV-2 Vaccination 03/31/2019, 04/28/2019, 12/13/2019, 06/26/2020   Pneumococcal Conjugate-13 11/16/2014   Pneumococcal Polysaccharide-23 12/25/2006, 10/29/2012   Rsv, Bivalent, Protein Subunit Rsvpref,pf Pattricia Bores) 04/17/2022   Td 11/20/2006   Tdap 12/18/2016   Typhoid Parenteral 03/29/2008   Zoster Recombinant(Shingrix) 02/19/2019, 06/15/2019   Zoster, Live 03/18/2007    TDAP status: Up to date  Flu Vaccine status: Up to date  Pneumococcal vaccine status: Up to date  Covid-19 vaccine status: Completed vaccines  Qualifies for Shingles Vaccine? Yes   Zostavax completed Yes   Shingrix Completed?: Yes  Screening Tests Health Maintenance  Topic Date Due   COVID-19 Vaccine (6 - 2024-25 season) 02/06/2023   Medicare Annual Wellness (AWV)  03/17/2024   DTaP/Tdap/Td (3 - Td or Tdap) 12/19/2026   Pneumonia Vaccine 47+ Years old  Completed   INFLUENZA VACCINE  Completed   Hepatitis C Screening  Completed   Zoster Vaccines- Shingrix  Completed   HPV VACCINES  Aged Out   Colonoscopy  Discontinued    Health Maintenance  Health Maintenance Due  Topic Date Due   COVID-19 Vaccine (6 - 2024-25 season) 02/06/2023    Colorectal cancer screening: No longer required.   Lung Cancer Screening: (Low Dose CT Chest recommended if Age 53-80 years, 20 pack-year currently smoking OR have quit w/in 15years.) does not qualify.   Lung Cancer Screening Referral: no  Additional  Screening:  Hepatitis C Screening: does qualify; Completed 12/03/2015  Vision Screening: Recommended annual ophthalmology exams for early detection of glaucoma and other disorders of the eye. Is the patient up to date with their annual eye exam?  Yes  Who is the provider or what is the name of the office in which the patient attends annual eye exams? Dr Buzz Cass If pt is not established with a provider, would they like to be referred to a provider to establish care? No .   Dental Screening: Recommended annual dental exams for proper oral hygiene  Diabetic Foot  Exam: n/a  Community Resource Referral / Chronic Care Management: CRR required this visit?  No   CCM required this visit?  Appt scheduled with PCP & Lab appt     Plan:     I have personally reviewed and noted the following in the patient's chart:   Medical and social history Use of alcohol, tobacco or illicit drugs  Current medications and supplements including opioid prescriptions. Patient is not currently taking opioid prescriptions. Functional ability and status Nutritional status Physical activity Advanced directives List of other physicians Hospitalizations, surgeries, and ER visits in previous 12 months Vitals Screenings to include cognitive, depression, and falls Referrals and appointments  In addition, I have reviewed and discussed with patient certain preventive protocols, quality metrics, and best practice recommendations. A written personalized care plan for preventive services as well as general preventive health recommendations were provided to patient.     Nerissa Bannister, LPN   1/61/0960   After Visit Summary: (MyChart) Due to this being a telephonic visit, the after visit summary with patients personalized plan was offered to patient via MyChart   Nurse Notes: Pt states he lost weight from a flare of diverticulitis. He has a colonoscopy scheduled in Feb 2025 per his GE md. He also started taking  Metamucil and fiber gummies to help control sx.

## 2023-03-20 ENCOUNTER — Ambulatory Visit: Payer: Medicare Other | Attending: Cardiovascular Disease | Admitting: Nurse Practitioner

## 2023-03-20 DIAGNOSIS — Z01810 Encounter for preprocedural cardiovascular examination: Secondary | ICD-10-CM

## 2023-03-20 NOTE — Progress Notes (Signed)
Virtual Visit via Telephone Note   Because of [name]'s co-morbid illnesses, he is at least at moderate risk for complications without adequate follow up.  This format is felt to be most appropriate for this patient at this time.  The patient did not have access to video technology/had technical difficulties with video requiring transitioning to audio format only (telephone).  All issues noted in this document were discussed and addressed.  No physical exam could be performed with this format.  Please refer to the patient's chart for his consent to telehealth for [facility].  Evaluation Performed:  Preoperative cardiovascular risk assessment _____________   Date:  03/20/2023   Patient ID:  Keith Heidelberg., DOB 05/14/1945, MRN [id number] Patient Location:  Home Provider location:   Office  Primary Care Provider:  Joaquim Nam, MD Primary Cardiologist:  Donato Schultz, MD  Chief Complaint / Patient Profile   78 y.o. y/o male with a h/o CAD s/p DES in 2017, hypertension, hyperlipidemia, and sleep disturbance who is pending colonoscopy on 04/09/2023 with Dr. Levora Angel of [facility] and presents today for telephonic preoperative cardiovascular risk assessment.  History of Present Illness    Keith Pask. is a 78 y.o. male who presents via audio/video conferencing for a telehealth visit today.  Pt was last seen in cardiology clinic on 08/01/2022 by Dr. Anne Fu. At that time Keith Heidelberg. was doing well.  The patient is now pending procedure as outlined above. Since his last visit, he has done well from a cardiac standpoint.  He plays pickle ball several days a week without difficulty.   He denies chest pain, palpitations, dyspnea, pnd, orthopnea, n, v, dizziness, syncope, edema, weight gain, or early satiety. All other systems reviewed and are otherwise negative except as noted above.   Past Medical History    Past Medical History:  Diagnosis Date   Adjustment  reaction with physical symptoms    Anxiety    Cancer, skin, squamous cell    R shoulder, removed per derm 2012 and chest   Coronary artery disease    a. 05/2015 c/p post-op surgery; b. 07/2015 MV EF 53%, basal inferoseptal, mid inferoseptal, and apical inferior mild ischemia;  c. 08/2015 Cath/PCI: LM nl, LAD 68m (3.5x20 Promus DES), 9m, LCX nl, RCA nl, EF 60%.   Diverticulosis 2018   Noted on colonoscopy   Ear ringing    Left   Elevated PSA    h/o with normal bx per URO   GERD (gastroesophageal reflux disease)    Hearing loss    Slight left ear   History of BPH    History of kidney stones    Hydronephrosis    Left, urteral obstruction, 2-3 cm UVJ   Hyperlipidemia    Hypertension    Nephrolithiasis    OA (osteoarthritis)    with r hip replacement   Osteoarthrosis involving, or with mention of more than one site, but not specified as generalized, site unspecified(715.80)    Prostate cancer (HCC)    Restless legs syndrome (RLS)    Ruptured lumbar disc    Syncope    negative cardiac w/u per Dr Eldridge Dace   Tick bite 2011   likely tick associated illness, treated with doxy with resolution,   Viral exanthem, unspecified    Past Surgical History:  Procedure Laterality Date   ACHILLES TENDON REPAIR Left 1984   CARDIAC CATHETERIZATION N/A 08/08/2015   Procedure: Left Heart Cath and  Coronary Angiography;  Surgeon: Jake Bathe, MD;  Location: [facility] INVASIVE CV LAB;  Service: Cardiovascular;  Laterality: N/A;   CARDIAC CATHETERIZATION N/A 08/08/2015   Procedure: Coronary Stent Intervention;  Surgeon: Corky Crafts, MD;  Location: [facility] INVASIVE CV LAB;  Service: Cardiovascular;  Laterality: N/A;   CATARACT EXTRACTION, BILATERAL     Left 2024, Right 2023   COLONOSCOPY  2018   CORONARY STENT PLACEMENT  08/08/2015   MID LAD WITH DES   JOINT REPLACEMENT     SHOULDER ARTHROSCOPY  2012   R rotator cuff (biceps tear not repaired)   SKIN CANCER EXCISION     right shoulder and mid chest    TOTAL HIP ARTHROPLASTY     right   TOTAL HIP ARTHROPLASTY Left 05/02/2015   Procedure: TOTAL LEFT HIP ARTHROPLASTY ANTERIOR APPROACH;  Surgeon: Ollen Gross, MD;  Location: [facility] ORS;  Service: Orthopedics;  Laterality: Left;   TOTAL SHOULDER ARTHROPLASTY Left 03/18/2018   Procedure: TOTAL SHOULDER ARTHROPLASTY;  Surgeon: Francena Hanly, MD;  Location: [facility];  Service: Orthopedics;  Laterality: Left;     Allergies  Allergies  Allergen Reactions   Xarelto [Rivaroxaban] Rash    Rash and itching with blisters.   Alprazolam Other (See Comments)    abnormal dreams   Benadryl Allergy [Diphenhydramine Hcl]     Mood change   Doxycycline Other (See Comments)    GI upset   Haldol [Haloperidol] Other (See Comments)    Worsens restless leg syndrome   Lactose Intolerance (Gi)    Lipitor [Atorvastatin] Other (See Comments)    Joints ached   Melatonin     Worse RLS symptoms   Morphine And Codeine Other (See Comments)    Abdominal bloating   Tamsulosin Other (See Comments)    Malaise/ lethargy   Zolpidem Tartrate Other (See Comments)    Terrible dreams    Crestor [Rosuvastatin Calcium] Other (See Comments)    Paranoid    Zetia [Ezetimibe] Diarrhea    Home Medications    Prior to Admission medications   Medication Sig Start Date End Date Taking? Authorizing Provider  Alcaftadine 0.25 % SOLN Place 1 drop into both eyes daily as needed (LASTACAFT - itchy eyes).  Patient not taking: Reported on 03/02/2023    [provider]  amLODipine (NORVASC) 10 MG tablet Take 1 tablet (10 mg total) by mouth daily. 03/18/22   Joaquim Nam, MD  amoxicillin-clavulanate (AUGMENTIN) 875-125 MG tablet Take 1 tablet by mouth 2 (two) times daily. Patient not taking: Reported on 03/18/2023 12/19/22   Joaquim Nam, MD  clopidogrel (PLAVIX) 75 MG tablet Take 1 tablet (75 mg total) by mouth daily with breakfast. 08/01/22   Jake Bathe, MD  diazepam (VALIUM) 5 MG tablet TAKE 1 TO 2 TABLETS BY   MOUTH EVERY 12 HOURS AS  NEEDED FOR ANXIETY AND FOR  INSOMNIA, sedation caution 03/03/23   Joaquim Nam, MD  Homeopathic Products [facility] RELIEF) FOAM  07/16/19   [provider]  hydrocortisone cream 1 % Apply 1 application topically daily as needed for itching.    [provider]  Lidocaine HCl-Benzyl Alcohol (SALONPAS LIDOCAINE PLUS) 4-10 % CREA Apply topically as needed.    [provider]  nitroGLYCERIN (NITROSTAT) 0.4 MG SL tablet Place 1 tablet (0.4 mg total) under the tongue every 5 (five) minutes as needed for chest pain. 08/01/22   Jake Bathe, MD  pantoprazole (PROTONIX) 40 MG tablet Take 1 tablet (40  mg total) by mouth daily. 03/18/22   Joaquim Nam, MD  REPATHA SURECLICK 140 MG/ML SOAJ ADMINISTER 1 ML UNDER THE SKIN EVERY 14 DAYS 02/16/23   Jake Bathe, MD  rOPINIRole (REQUIP) 0.5 MG tablet Take 1 tablet (0.5 mg total) by mouth at bedtime. 03/18/22   Joaquim Nam, MD  SIMPLY SALINE NA Place 1 spray into both nostrils 2 (two) times daily.     [provider]  traMADol (ULTRAM) 50 MG tablet TAKE 1 TO 2 TABLETS BY MOUTH EVERY 8 HOURS AS NEEDED FOR PAIN 03/03/23   Joaquim Nam, MD  White Petrolatum-Mineral Oil (ARTIFICIAL TEARS) ointment Place 1 drop into both eyes daily.    [provider]    Physical Exam    Vital Signs:  Keith Heidelberg. does not have vital signs available for review today.  Given telephonic nature of communication, physical exam is limited. AAOx3. NAD. Normal affect.  Speech and respirations are unlabored.  Accessory Clinical Findings    None  Assessment & Plan    1.  Preoperative Cardiovascular Risk Assessment:  According to the Revised Cardiac Risk Index (RCRI), his Perioperative Risk of Major Cardiac Event is (%): 0.9. His Functional Capacity in METs is: 7.99 according to the Duke Activity Status Index (DASI). Therefore, based on ACC/AHA guidelines, patient would be at acceptable risk for  the planned procedure without further cardiovascular testing.   The patient was advised that if he develops new symptoms prior to surgery to contact our office to arrange for a follow-up visit, and he verbalized understanding.  Per office protocol, he may hold Plavix for 5 days prior to procedure. Please resume Plavix as soon as possible postprocedure, at the discretion of the surgeon.    A copy of this note will be routed to requesting surgeon.  Time:   Today, I have spent 5 minutes with the patient with telehealth technology discussing medical history, symptoms, and management plan.     Joylene Grapes, NP  03/20/2023, 10:51 AM

## 2023-03-28 ENCOUNTER — Other Ambulatory Visit: Payer: Self-pay | Admitting: Family Medicine

## 2023-03-28 DIAGNOSIS — I10 Essential (primary) hypertension: Secondary | ICD-10-CM

## 2023-03-29 ENCOUNTER — Other Ambulatory Visit: Payer: Self-pay | Admitting: Family Medicine

## 2023-03-31 ENCOUNTER — Other Ambulatory Visit: Payer: Medicare Other

## 2023-03-31 ENCOUNTER — Other Ambulatory Visit (INDEPENDENT_AMBULATORY_CARE_PROVIDER_SITE_OTHER): Payer: Medicare Other

## 2023-03-31 DIAGNOSIS — I10 Essential (primary) hypertension: Secondary | ICD-10-CM

## 2023-04-01 LAB — COMPREHENSIVE METABOLIC PANEL
ALT: 10 U/L (ref 0–53)
AST: 16 U/L (ref 0–37)
Albumin: 4.1 g/dL (ref 3.5–5.2)
Alkaline Phosphatase: 65 U/L (ref 39–117)
BUN: 9 mg/dL (ref 6–23)
CO2: 29 meq/L (ref 19–32)
Calcium: 9 mg/dL (ref 8.4–10.5)
Chloride: 101 meq/L (ref 96–112)
Creatinine, Ser: 1.02 mg/dL (ref 0.40–1.50)
GFR: 70.75 mL/min (ref >60.00)
Glucose, Bld: 84 mg/dL (ref 70–99)
Potassium: 4.2 meq/L (ref 3.5–5.1)
Sodium: 138 meq/L (ref 135–145)
Total Bilirubin: 0.4 mg/dL (ref 0.2–1.2)
Total Protein: 6.8 g/dL (ref 6.0–8.3)

## 2023-04-01 LAB — CBC WITH DIFFERENTIAL/PLATELET
Basophils Absolute: 0 10*3/uL (ref 0.0–0.1)
Basophils Relative: 0.5 % (ref 0.0–3.0)
Eosinophils Absolute: 0.1 10*3/uL (ref 0.0–0.7)
Eosinophils Relative: 2.2 % (ref 0.0–5.0)
HCT: 40.3 % (ref 39.0–52.0)
Hemoglobin: 13.2 g/dL (ref 13.0–17.0)
Lymphocytes Relative: 44.4 % (ref 12.0–46.0)
Lymphs Abs: 2.3 10*3/uL (ref 0.7–4.0)
MCHC: 32.8 g/dL (ref 30.0–36.0)
MCV: 90.8 fL (ref 78.0–100.0)
Monocytes Absolute: 0.3 10*3/uL (ref 0.1–1.0)
Monocytes Relative: 6.4 % (ref 3.0–12.0)
Neutro Abs: 2.4 10*3/uL (ref 1.4–7.7)
Neutrophils Relative %: 46.5 % (ref 43.0–77.0)
Platelets: 305 10*3/uL (ref 150.0–400.0)
RBC: 4.44 Mil/uL (ref 4.22–5.81)
RDW: 13.5 % (ref 11.5–15.5)
WBC: 5.1 10*3/uL (ref 4.0–10.5)

## 2023-04-01 LAB — LIPID PANEL
Cholesterol: 94 mg/dL (ref 0–200)
HDL: 40.9 mg/dL (ref >39.00)
LDL Cholesterol: 30 mg/dL (ref 0–99)
NonHDL: 53.34
Total CHOL/HDL Ratio: 2
Triglycerides: 119 mg/dL (ref 0.0–149.0)
VLDL: 23.8 mg/dL (ref 0.0–40.0)

## 2023-04-07 ENCOUNTER — Encounter: Payer: Medicare Other | Admitting: Family Medicine

## 2023-04-09 LAB — HM COLONOSCOPY

## 2023-04-10 ENCOUNTER — Other Ambulatory Visit: Payer: Self-pay | Admitting: Family Medicine

## 2023-04-15 ENCOUNTER — Encounter: Payer: Self-pay | Admitting: Family Medicine

## 2023-04-23 ENCOUNTER — Encounter: Payer: Self-pay | Admitting: Family Medicine

## 2023-04-23 ENCOUNTER — Ambulatory Visit (INDEPENDENT_AMBULATORY_CARE_PROVIDER_SITE_OTHER): Payer: Medicare Other | Admitting: Family Medicine

## 2023-04-23 VITALS — BP 124/62 | HR 61 | Temp 98.3°F | Ht 66.54 in | Wt 155.6 lb

## 2023-04-23 DIAGNOSIS — I10 Essential (primary) hypertension: Secondary | ICD-10-CM | POA: Diagnosis not present

## 2023-04-23 DIAGNOSIS — M169 Osteoarthritis of hip, unspecified: Secondary | ICD-10-CM | POA: Diagnosis not present

## 2023-04-23 DIAGNOSIS — Z0000 Encounter for general adult medical examination without abnormal findings: Secondary | ICD-10-CM | POA: Diagnosis not present

## 2023-04-23 DIAGNOSIS — E7800 Pure hypercholesterolemia, unspecified: Secondary | ICD-10-CM

## 2023-04-23 DIAGNOSIS — I2510 Atherosclerotic heart disease of native coronary artery without angina pectoris: Secondary | ICD-10-CM

## 2023-04-23 DIAGNOSIS — F419 Anxiety disorder, unspecified: Secondary | ICD-10-CM

## 2023-04-23 DIAGNOSIS — G2581 Restless legs syndrome: Secondary | ICD-10-CM

## 2023-04-23 DIAGNOSIS — G4730 Sleep apnea, unspecified: Secondary | ICD-10-CM

## 2023-04-23 DIAGNOSIS — Z7189 Other specified counseling: Secondary | ICD-10-CM

## 2023-04-23 MED ORDER — AMLODIPINE BESYLATE 10 MG PO TABS: 10.0000 mg | ORAL_TABLET | Freq: Every day | ORAL | 3 refills | Status: AC

## 2023-04-23 MED ORDER — ROPINIROLE HCL 0.5 MG PO TABS: 0.5000 mg | ORAL_TABLET | Freq: Every day | ORAL | 3 refills | Status: AC

## 2023-04-23 NOTE — Patient Instructions (Signed)
Don't change your meds for now.  Update me as needed.  Take care.  Glad to see you.   

## 2023-04-23 NOTE — Progress Notes (Signed)
 Hypertension:    Using medication without problems or lightheadedness: rarely lightheaded, cautions d/w pt about staying well hydrated.   Chest pain with exertion:no Edema:no Short of breath:no  OSA with prev sleep study done.  He is in the midst of dental eval to see if he can use a dental appliance.    H/o CAD.  On plavix.  H/o stent.  No CP.  Seeing cardiology.    Elevated Cholesterol: Using medications without problems: yes Muscle aches: no Diet compliance: d/w pt.   Exercise: d/w pt.  Statin intolerant.   Joint pain.  Tramadol helped.  No ADE on med.  Joint pain affected by weather change.  Using a heating pad at night as needed.    RLS.  Requip helped with RLS.  No ADE on med.  Sleeping better with use.  Discussed that he could use a second dose if needed but the need hasn't arisen so far.    Mood d/w pt.  Taking diazepam rarely, not nightly.  No ADE on med.  Stressors d/w pt.    Flu 2024 Shingles previously done PNA previously done Tetanus 2018 COVID previously done RSV prev done.  Colon cancer screening 2025 Prostate cancer screening- per urology.   Advance directive- wife designated if patient were incapacitated  Meds, vitals, and allergies reviewed.   ROS: Per HPI unless specifically indicated in ROS section   GEN: nad, alert and oriented HEENT: ncat NECK: supple w/o LA CV: rrr PULM: ctab, no inc wob ABD: soft, +bs EXT: no edema SKIN: no acute rash but SKs noted on the trunk.

## 2023-04-25 DIAGNOSIS — G4730 Sleep apnea, unspecified: Secondary | ICD-10-CM | POA: Insufficient documentation

## 2023-04-25 NOTE — Assessment & Plan Note (Signed)
 Tramadol helped.  No ADE on med.  Joint pain affected by weather change.  Using a heating pad at night as needed.  Continue as is.

## 2023-04-25 NOTE — Assessment & Plan Note (Signed)
 Continue repatha, labs d/w pt. Statin intolerant.

## 2023-04-25 NOTE — Assessment & Plan Note (Signed)
 Advance directive- wife designated if patient were incapacitated.

## 2023-04-25 NOTE — Assessment & Plan Note (Signed)
 Continue amlodipine

## 2023-04-25 NOTE — Assessment & Plan Note (Signed)
 Requip helped with RLS.  No ADE on med.  Sleeping better with use.  Discussed that he could use a second dose if needed but the need hasn't arisen so far.

## 2023-04-25 NOTE — Assessment & Plan Note (Signed)
 Taking diazepam rarely, not nightly.  No ADE on med.  Stressors d/w pt.

## 2023-04-25 NOTE — Assessment & Plan Note (Signed)
 Continue plavix.  No CP.  Has seen cardiology. Doing well.

## 2023-04-25 NOTE — Assessment & Plan Note (Signed)
 OSA with prev sleep study done.  He is in the midst of dental eval to see if he can use a dental appliance.  I'll defer. He agrees.

## 2023-04-25 NOTE — Assessment & Plan Note (Signed)
 Flu 2024 Shingles previously done PNA previously done Tetanus 2018 COVID previously done RSV prev done.  Colon cancer screening 2025 Prostate cancer screening- per urology.   Advance directive- wife designated if patient were incapacitated

## 2023-06-01 ENCOUNTER — Other Ambulatory Visit: Payer: Self-pay | Admitting: Family Medicine

## 2023-06-02 NOTE — Telephone Encounter (Signed)
 LAST APPOINTMENT DATE: 04/23/23      NEXT APPOINTMENT DATE: n/a       LAST REFILL: 03/03/23   QTY: #180 2 rf

## 2023-06-03 NOTE — Telephone Encounter (Signed)
 Sent. Thanks.

## 2023-06-28 ENCOUNTER — Other Ambulatory Visit: Payer: Self-pay | Admitting: Family Medicine

## 2023-08-30 ENCOUNTER — Other Ambulatory Visit: Payer: Self-pay | Admitting: Family Medicine

## 2023-09-01 NOTE — Telephone Encounter (Signed)
 LOV:04/23/23 WNC:WNUYPWH SCHEDULED  LAST REFILL: TRAMADOL  HCL 50MG  TABLET 06/03/23 180 tablets 2 refills

## 2023-09-25 ENCOUNTER — Other Ambulatory Visit: Payer: Self-pay | Admitting: Family Medicine

## 2023-09-28 ENCOUNTER — Other Ambulatory Visit: Payer: Self-pay

## 2023-09-28 MED ORDER — CLOPIDOGREL BISULFATE 75 MG PO TABS: 75.0000 mg | ORAL_TABLET | Freq: Every day | ORAL | 0 refills | Status: DC

## 2023-10-26 ENCOUNTER — Encounter: Payer: Self-pay | Admitting: Family Medicine

## 2023-10-26 ENCOUNTER — Other Ambulatory Visit: Payer: Self-pay | Admitting: Family Medicine

## 2023-10-26 ENCOUNTER — Telehealth: Payer: Self-pay | Admitting: Family Medicine

## 2023-10-26 MED ORDER — AMOXICILLIN-POT CLAVULANATE 875-125 MG PO TABS: 1.0000 | ORAL_TABLET | Freq: Two times a day (BID) | ORAL | 0 refills | Status: DC

## 2023-10-26 NOTE — Telephone Encounter (Signed)
 Please check with patient about his status and rx for augmentin . Thanks.

## 2023-10-26 NOTE — Telephone Encounter (Signed)
 Copied from CRM [id number]. Topic: Clinical - Medication Refill >> Oct 26, 2023  9:39 AM Zebedee SAUNDERS wrote: Medication: Augmentin  for pt's diverticulitis  Has the patient contacted their pharmacy? Yes (Agent: If no, request that the patient contact the pharmacy for the refill. If patient does not wish to contact the pharmacy document the reason why and proceed with request.) (Agent: If yes, when and what did the pharmacy advise?)Phar mary need script.   This is the patient's preferred pharmacy:  [facility] [id number] [place], [place] - [address] [place] [address] Phone: [phone] Fax: [phone]  Is this the correct pharmacy for this prescription? Yes If no, delete pharmacy and type the correct one.   Has the prescription been filled recently? Yes  Is the patient out of the medication? Yes  Has the patient been seen for an appointment in the last year OR does the patient have an upcoming appointment? Yes  Can we respond through MyChart? Yes  Agent: Please be advised that Rx refills may take up to 3 business days. We ask that you follow-up with your pharmacy.

## 2023-10-26 NOTE — Telephone Encounter (Signed)
 See my chart message

## 2023-11-04 ENCOUNTER — Encounter (HOSPITAL_BASED_OUTPATIENT_CLINIC_OR_DEPARTMENT_OTHER): Payer: Self-pay

## 2023-11-05 ENCOUNTER — Ambulatory Visit (HOSPITAL_BASED_OUTPATIENT_CLINIC_OR_DEPARTMENT_OTHER): Admitting: Cardiology

## 2023-11-05 ENCOUNTER — Encounter (HOSPITAL_BASED_OUTPATIENT_CLINIC_OR_DEPARTMENT_OTHER): Payer: Self-pay | Admitting: Cardiology

## 2023-11-05 VITALS — BP 100/68 | HR 77 | Ht 66.0 in | Wt 154.0 lb

## 2023-11-05 DIAGNOSIS — G4733 Obstructive sleep apnea (adult) (pediatric): Secondary | ICD-10-CM | POA: Diagnosis not present

## 2023-11-05 DIAGNOSIS — E785 Hyperlipidemia, unspecified: Secondary | ICD-10-CM

## 2023-11-05 DIAGNOSIS — I2510 Atherosclerotic heart disease of native coronary artery without angina pectoris: Secondary | ICD-10-CM | POA: Diagnosis not present

## 2023-11-05 DIAGNOSIS — I10 Essential (primary) hypertension: Secondary | ICD-10-CM

## 2023-11-05 NOTE — Progress Notes (Signed)
 Cardiology Office Note:  .   Date:  11/05/2023  ID:  Keith JAYSON Eduardo Mickey., DOB [birth year], MRN [id number] PCP: Cleatus Arlyss RAMAN, MD  [facility] Providers Cardiologist:  Keith Parchment, MD     History of Present Illness: .   Keith C Wunder Jr. is a 78 y.o. male Discussed the use of AI scribe software for clinical note transcription with the patient, who gave verbal consent to proceed.  History of Present Illness Keith Elms. is a 78 year old male with coronary artery disease who presents for a follow-up visit.  He has a history of coronary artery disease and underwent a drug-eluting stent placement in June 2017 following an abnormal stress test. A left heart catheterization in 2017 revealed an 85% lesion in the mid LAD. He is currently on Plavix  (clopidogrel ) 75 mg daily and Repatha , which he tolerates well. His LDL was previously recorded at 37. No significant chest pain or exertional difficulties, though he occasionally experiences mild discomfort attributed to gas or cramps.  He experienced a bout of diverticulitis a couple of weeks ago, managed with Cipro  and Augmentin . He attributes the episode to dietary indiscretion, specifically consuming Doritos.  He underwent a sleep study and received recommendations, noting that he is a side sleeper and experiences difficulty breathing when on his back. He has not used a CPAP mask, unlike his brother, and manages his condition by ensuring he sleeps on his side.  Used to work as Civil engineer, contracting    Studies Reviewed: SABRA   EKG Interpretation Date/Time:  Thursday November 05 2023 13:15:45 EDT Ventricular Rate:  77 PR Interval:  138 QRS Duration:  86 QT Interval:  380 QTC Calculation: 430 R Axis:   16  Text Interpretation: Normal sinus rhythm Normal ECG When compared with ECG of 01-Jun-2016 23:05, No significant change since last tracing Confirmed by Fritz Keith ([id number]) on 11/05/2023 1:27:16 PM    Results LABS LDL: 30  (03/31/2023)  DIAGNOSTIC ECG: Normal (11/05/2023) Risk Assessment/Calculations:        STOP-Bang Score:         Physical Exam:   VS:  BP 100/68   Pulse 77   Ht 5' 6 (1.676 m)   Wt 154 lb (69.9 kg)   SpO2 97%   BMI 24.86 kg/m    Wt Readings from Last 3 Encounters:  11/05/23 154 lb (69.9 kg)  04/23/23 155 lb 9.6 oz (70.6 kg)  03/18/23 155 lb (70.3 kg)    GEN: Well nourished, well developed in no acute distress NECK: No JVD; No carotid bruits CARDIAC: RRR, no murmurs, no rubs, no gallops RESPIRATORY:  Clear to auscultation without rales, wheezing or rhonchi  ABDOMEN: Soft, non-tender, non-distended EXTREMITIES:  No edema; No deformity   ASSESSMENT AND PLAN: .    Assessment and Plan Assessment & Plan Coronary artery disease, status post drug-eluting stent Coronary artery disease is well-managed post drug-eluting stent placement in 2017. He reports no significant chest pain or exertional symptoms. Blood pressure is controlled at 100/68 mmHg, and EKG is normal. Current medications, Amlodipine  and Plavix , are effective. - Continue Amlodipine  10 mg daily. - Continue Plavix  75 mg daily. - Schedule follow-up in one year with a PA.  Pure hypercholesterolemia on PCSK9 inhibitor therapy Hypercholesterolemia is well-managed with Repatha , a PCSK9 inhibitor. LDL levels are excellently controlled at 30 mg/dL. He previously experienced intolerance to Zetia  due to diarrhea. Repatha  is well-tolerated and effective in maintaining target LDL levels. - Continue Repatha  as scheduled. -  Monitor LDL levels as per routine.         Dispo: 1 yr APP  Signed, Keith Parchment, MD

## 2023-11-05 NOTE — Patient Instructions (Signed)
 Medication Instructions:  The current medical regimen is effective;  continue present plan and medications.  *If you need a refill on your cardiac medications before your next appointment, please call your pharmacy*  Follow-Up: At [facility], you and your health needs are our priority.  As part of our continuing mission to provide you with exceptional heart care, our providers are all part of one team.  This team includes your primary Cardiologist (physician) and Advanced Practice Providers or APPs (Physician Assistants and Nurse Practitioners) who all work together to provide you with the care you need, when you need it.  Your next appointment:   1 year(s)  Provider:   One of our Advanced Practice Providers (APPs): Melita Springer, PA-C  Friddie Jetty, NP Evaline Hill, NP  Theotis Flake, PA-C Lawana Pray, NP  Willis Harter, PA-C Lovette Rud, PA-C  [place], PA-C Ernest Dick, NP  Marlana Silvan, NP Marcie Sever, PA-C  Laquita Plant, PA-C    Dayna Dunn, PA-C  Scott Weaver, PA-C Palmer Bobo, NP Katlyn West, NP Callie Goodrich, PA-C  Evan Williams, PA-C Sheng Haley, PA-C  Xika Zhao, NP Kathleen Johnson, PA-C       We recommend signing up for the patient portal called "MyChart".  Sign up information is provided on this After Visit Summary.  MyChart is used to connect with patients for Virtual Visits (Telemedicine).  Patients are able to view lab/test results, encounter notes, upcoming appointments, etc.  Non-urgent messages can be sent to your provider as well.   To learn more about what you can do with MyChart, go to [email or web address].

## 2023-11-06 ENCOUNTER — Encounter: Payer: Self-pay | Admitting: Family Medicine

## 2023-11-30 ENCOUNTER — Other Ambulatory Visit: Payer: Self-pay | Admitting: Family Medicine

## 2023-12-01 NOTE — Telephone Encounter (Signed)
 LOV: 04/23/23 NOV: nothing scheduled Last Refill: traMADol  (ULTRAM ) 50 MG tablet 09/02/2023 180 tablets 2 refills

## 2023-12-29 ENCOUNTER — Other Ambulatory Visit: Payer: Self-pay | Admitting: Family Medicine

## 2023-12-29 ENCOUNTER — Other Ambulatory Visit: Payer: Self-pay | Admitting: Cardiology

## 2023-12-29 DIAGNOSIS — K219 Gastro-esophageal reflux disease without esophagitis: Secondary | ICD-10-CM

## 2024-01-31 ENCOUNTER — Encounter: Payer: Self-pay | Admitting: Family Medicine

## 2024-02-04 ENCOUNTER — Encounter (HOSPITAL_BASED_OUTPATIENT_CLINIC_OR_DEPARTMENT_OTHER): Payer: Self-pay | Admitting: Cardiology

## 2024-02-04 DIAGNOSIS — I2510 Atherosclerotic heart disease of native coronary artery without angina pectoris: Secondary | ICD-10-CM

## 2024-02-04 DIAGNOSIS — E785 Hyperlipidemia, unspecified: Secondary | ICD-10-CM

## 2024-02-04 MED ORDER — REPATHA SURECLICK 140 MG/ML ~~LOC~~ SOAJ: 140.0000 mg | SUBCUTANEOUS | 3 refills | Status: AC

## 2024-02-19 ENCOUNTER — Encounter: Payer: Self-pay | Admitting: Family Medicine

## 2024-02-21 ENCOUNTER — Emergency Department (HOSPITAL_BASED_OUTPATIENT_CLINIC_OR_DEPARTMENT_OTHER)
Admission: EM | Admit: 2024-02-21 | Discharge: 2024-02-21 | Disposition: A | Discharge status: Home / Self Care | Attending: Emergency Medicine | Admitting: Emergency Medicine

## 2024-02-21 ENCOUNTER — Encounter (HOSPITAL_BASED_OUTPATIENT_CLINIC_OR_DEPARTMENT_OTHER): Payer: Self-pay

## 2024-02-21 ENCOUNTER — Other Ambulatory Visit: Payer: Self-pay

## 2024-02-21 DIAGNOSIS — J069 Acute upper respiratory infection, unspecified: Secondary | ICD-10-CM | POA: Diagnosis not present

## 2024-02-21 DIAGNOSIS — I2510 Atherosclerotic heart disease of native coronary artery without angina pectoris: Secondary | ICD-10-CM | POA: Diagnosis not present

## 2024-02-21 DIAGNOSIS — Z7901 Long term (current) use of anticoagulants: Secondary | ICD-10-CM | POA: Diagnosis not present

## 2024-02-21 DIAGNOSIS — Z79899 Other long term (current) drug therapy: Secondary | ICD-10-CM | POA: Diagnosis not present

## 2024-02-21 DIAGNOSIS — R0981 Nasal congestion: Secondary | ICD-10-CM | POA: Diagnosis present

## 2024-02-21 DIAGNOSIS — I10 Essential (primary) hypertension: Secondary | ICD-10-CM | POA: Insufficient documentation

## 2024-02-21 LAB — RESP PANEL BY RT-PCR (RSV, FLU A&B, COVID)  RVPGX2
Influenza A by PCR: NEGATIVE
Influenza B by PCR: NEGATIVE
Resp Syncytial Virus by PCR: NEGATIVE
SARS Coronavirus 2 by RT PCR: NEGATIVE

## 2024-02-21 LAB — GROUP A STREP BY PCR: Group A Strep by PCR: NOT DETECTED

## 2024-02-21 MED ORDER — FLUTICASONE PROPIONATE 50 MCG/ACT NA SUSP: 1.0000 | Freq: Every day | NASAL | 0 refills | Status: AC

## 2024-02-21 NOTE — ED Triage Notes (Signed)
 Pt reports flu like S/S since Tuesday.

## 2024-02-21 NOTE — ED Provider Notes (Signed)
 " [facility] AT [facility] Provider Note   CSN: [id number] Arrival date & time: 02/21/24  1312     Patient presents with: Nasal Congestion (/)   [name]. is a 78 y.o. male.   Patient with history of coronary artery disease, on Plavix , history of hypertension well-controlled --presents to the emergency department today for evaluation of nasal congestion, red throat.  Today is day 5 of illness.  He has not had a fever.  No significant cough.  States that pharmacist recommended Zyrtec.  He feels that this has been giving him some diarrhea.  He has also been using saline spray.  He wanted to get checked due to the upcoming holiday.  Nasal congestion has been getting somewhat better.       Prior to Admission medications  Medication Sig Start Date End Date Taking? Authorizing Provider  Alcaftadine 0.25 % SOLN Place 1 drop into both eyes daily as needed (LASTACAFT - itchy eyes).    [provider]  amLODipine  (NORVASC ) 10 MG tablet Take 1 tablet (10 mg total) by mouth daily. 04/23/23   Cleatus Arlyss RAMAN, MD  amoxicillin -clavulanate (AUGMENTIN ) 875-125 MG tablet Take 1 tablet by mouth every 12 (twelve) hours. 10/26/23   Cleatus Arlyss RAMAN, MD  clopidogrel  (PLAVIX ) 75 MG tablet TAKE 1 TABLET BY MOUTH DAILY WITH BREAKFAST 12/29/23   Jeffrie Oneil BROCKS, MD  diazepam  (VALIUM ) 5 MG tablet TAKE 1 TO 2 TABLETS BY  MOUTH EVERY 12 HOURS AS  NEEDED FOR ANXIETY AND FOR  INSOMNIA, sedation caution 03/03/23   Cleatus Arlyss RAMAN, MD  Evolocumab  (REPATHA  SURECLICK) 140 MG/ML SOAJ Inject 140 mg into the skin every 14 (fourteen) days. 02/04/24   Jeffrie Oneil BROCKS, MD  Homeopathic Products [facility] RELIEF) FOAM  07/16/19   [provider]  hydrocortisone cream 1 % Apply 1 application topically daily as needed for itching.    [provider]  Lidocaine  HCl-Benzyl Alcohol (SALONPAS LIDOCAINE  PLUS) 4-10 % CREA Apply topically as needed.    [provider]   nitroGLYCERIN  (NITROSTAT ) 0.4 MG SL tablet Place 1 tablet (0.4 mg total) under the tongue every 5 (five) minutes as needed for chest pain. 08/01/22   Jeffrie Oneil BROCKS, MD  pantoprazole  (PROTONIX ) 40 MG tablet TAKE 1 TABLET BY MOUTH DAILY 12/29/23   Cleatus Arlyss RAMAN, MD  rOPINIRole  (REQUIP ) 0.5 MG tablet Take 1 tablet (0.5 mg total) by mouth at bedtime. 04/23/23   Cleatus Arlyss RAMAN, MD  SIMPLY SALINE NA Place 1 spray into both nostrils 2 (two) times daily.     [provider]  traMADol  (ULTRAM ) 50 MG tablet Take 1-2 tablets (50-100 mg total) by mouth 3 (three) times daily as needed. 12/02/23   Cleatus Arlyss RAMAN, MD  White Petrolatum-Mineral Oil (ARTIFICIAL TEARS) ointment Place 1 drop into both eyes daily.    [provider]    Allergies: Xarelto  [rivaroxaban ], Alprazolam, Benadryl  allergy [diphenhydramine  hcl], Doxycycline , Haldol [haloperidol], Lactose intolerance (gi), Lipitor [atorvastatin ], Melatonin, Morphine  and codeine, Tamsulosin , Zolpidem tartrate, Crestor  [rosuvastatin  calcium ], and Zetia  [ezetimibe ]    Review of Systems  Updated Vital Signs BP (!) 140/113   Pulse 82   Temp 98.2 F (36.8 C) (Oral)   Resp 20   Ht 5' 7 (1.702 m)   Wt 70.3 kg   SpO2 99%   BMI 24.28 kg/m   Physical Exam Vitals and nursing note reviewed.  Constitutional:      Appearance: He is well-developed.  HENT:  Head: Normocephalic and atraumatic.     Jaw: No trismus.     Right Ear: Tympanic membrane, ear canal and external ear normal.     Left Ear: Tympanic membrane, ear canal and external ear normal.     Nose: Congestion present. No mucosal edema or rhinorrhea.     Mouth/Throat:     Mouth: Mucous membranes are not dry.     Pharynx: Uvula midline. Posterior oropharyngeal erythema present. No oropharyngeal exudate or uvula swelling.     Tonsils: No tonsillar abscesses.  Eyes:     General:        Right eye: No discharge.        Left eye: No discharge.     Conjunctiva/sclera:  Conjunctivae normal.  Cardiovascular:     Rate and Rhythm: Normal rate and regular rhythm.     Heart sounds: Normal heart sounds.  Pulmonary:     Effort: Pulmonary effort is normal. No respiratory distress.     Breath sounds: Normal breath sounds. No wheezing or rales.     Comments: Lungs clear to auscultation bilaterally Abdominal:     Palpations: Abdomen is soft.     Tenderness: There is no abdominal tenderness.  Musculoskeletal:     Cervical back: Normal range of motion and neck supple.  Skin:    General: Skin is warm and dry.  Neurological:     Mental Status: He is alert.     (all labs ordered are listed, but only abnormal results are displayed) Labs Reviewed  GROUP A STREP BY PCR  RESP PANEL BY RT-PCR (RSV, FLU A&B, COVID)  [id number]    EKG: None  Radiology: No results found.   Procedures   Medications Ordered in the ED - No data to display  ED Course  Patient seen and examined. History obtained directly from patient.   Labs/EKG: Viral panel, strep ordered in triage.  Imaging: None ordered  Medications/Fluids: None ordered  Most recent vital signs reviewed and are as follows: BP (!) 140/113   Pulse 82   Temp 98.2 F (36.8 C) (Oral)   Resp 20   Ht 5' 7 (1.702 m)   Wt 70.3 kg   SpO2 99%   BMI 24.28 kg/m   Initial impression: Upper respiratory infection, well-appearing patient.  2:26 PM Reassessment performed. Patient appears stable.  Labs personally reviewed and interpreted including: Viral panel was negative.  Strep test was negative.  Reviewed pertinent lab work and imaging with patient at bedside. Questions answered.  We did discuss symptomatic treatment.  Will give a prescription for Flonase .  We did talk about Sudafed, but given his coronary artery disease history, would opt to avoid.  Most current vital signs reviewed and are as follows: BP (!) 140/113   Pulse 82   Temp 98.2 F (36.8 C) (Oral)   Resp 20   Ht 5' 7 (1.702 m)   Wt 70.3 kg    SpO2 99%   BMI 24.28 kg/m   Plan: Discharge to home.   Prescriptions written for: Flonase   Other home care instructions discussed: Continue saline rinse, Tylenol , OTC cough medication, antihistamines  ED return instructions discussed: Worsening shortness of breath, fever  Follow-up instructions discussed: Patient encouraged to follow-up with their PCP in 5 days not improving.                                  Medical Decision Making  Patient  with constellation of symptoms consistent with a respiratory tract infection, likely viral.  Any swabs performed as above. Vitals are stable.  Fever if present, controlled.  No clinical signs of dehydration and patient without history of persistent vomiting.  Lung exam is clear without crackles or rales to suggest pneumonia.  No increased work of breathing.  Given well appearance, supportive therapy indicated with return if symptoms worsen.    The patient's vital signs, pertinent lab work and imaging were reviewed and interpreted as discussed in the ED course. Hospitalization was considered for further testing, treatments, or serial exams/observation. However as patient is well-appearing, has a stable exam, and reassuring studies today, I do not feel that they warrant admission at this time. This plan was discussed with the patient who verbalizes agreement and comfort with this plan and seems reliable and able to return to the Emergency Department with worsening or changing symptoms.       Final diagnoses:  Upper respiratory tract infection, unspecified type    ED Discharge Orders          Ordered    fluticasone  (FLONASE ) 50 MCG/ACT nasal spray  Daily        02/21/24 1424               Desiderio Chew, PA-C 02/21/24 1427    Ruthe Cornet, DO 02/21/24 1442  "

## 2024-02-21 NOTE — Discharge Instructions (Signed)
 Please read and follow all provided instructions.  Your diagnoses today include:  1. Upper respiratory tract infection, unspecified type     You appear to have an upper respiratory infection (URI). An upper respiratory tract infection, or cold, is a viral infection of the air passages leading to the lungs. It should improve gradually after 5-7 days. You may have a lingering cough that lasts for 2- 4 weeks after the infection.  Tests performed today include: Vital signs. See below for your results today.   Medications prescribed:  Flonase : Inhaled nasal steroid  Take any prescribed medications only as directed. Treatment for your infection is aimed at treating the symptoms. There are no medications, such as antibiotics, that will cure your infection.   Home care instructions:  You can take Tylenol  as directed on the packaging for fever reduction and pain relief.    For cough: honey 1/2 to 1 teaspoon (you can dilute the honey in water  or another fluid).  You can also use guaifenesin and dextromethorphan for cough. You can use a humidifier for chest congestion and cough.  If you don't have a humidifier, you can sit in the bathroom with the hot shower running.      For sore throat: try warm salt water  gargles, cepacol lozenges, throat spray, warm tea or water  with lemon/honey, popsicles or ice, or OTC cold relief medicine for throat discomfort.    For congestion: take a daily anti-histamine like Zyrtec, Claritin.  You can also use Flonase  1-2 sprays in each nostril daily.    It is important to stay hydrated: drink plenty of fluids (water , gatorade/powerade/pedialyte, juices, or teas) to keep your throat moisturized and help further relieve irritation/discomfort.   Your illness is contagious and can be spread to others, especially during the first 3 or 4 days. It cannot be cured by antibiotics or other medicines. Take basic precautions such as washing your hands often, covering your mouth when  you cough or sneeze, and avoiding public places where you could spread your illness to others.   Please continue drinking plenty of fluids.  Use over-the-counter medicines as needed as directed on packaging for symptom relief.  You may also use ibuprofen or tylenol  as directed on packaging for pain or fever.  Do not take multiple medicines containing Tylenol  or acetaminophen  to avoid taking too much of this medication.  Follow-up instructions: Please follow-up with your primary care provider in the next 3 days for further evaluation of your symptoms if you are not feeling better.   Return instructions:  Please return to the Emergency Department if you experience worsening symptoms.  RETURN IMMEDIATELY IF you develop shortness of breath, confusion or altered mental status, a new rash, become dizzy, faint, or poorly responsive, or are unable to be cared for at home. Please return if you have persistent vomiting and cannot keep down fluids or develop a fever that is not controlled by tylenol  or motrin.   Please return if you have any other emergent concerns.  Additional Information:  Your vital signs today were: BP (!) 140/113   Pulse 82   Temp 98.2 F (36.8 C) (Oral)   Resp 20   Ht 5' 7 (1.702 m)   Wt 70.3 kg   SpO2 99%   BMI 24.28 kg/m  If your blood pressure (BP) was elevated above 135/85 this visit, please have this repeated by your doctor within one month. --------------

## 2024-02-22 ENCOUNTER — Telehealth: Payer: Self-pay

## 2024-02-22 DIAGNOSIS — Z125 Encounter for screening for malignant neoplasm of prostate: Secondary | ICD-10-CM

## 2024-02-22 DIAGNOSIS — I2510 Atherosclerotic heart disease of native coronary artery without angina pectoris: Secondary | ICD-10-CM

## 2024-02-22 DIAGNOSIS — I10 Essential (primary) hypertension: Secondary | ICD-10-CM

## 2024-02-22 NOTE — Addendum Note (Signed)
 Addended by: CLEATUS ARLYSS RAMAN on: 02/22/2024 03:18 PM   Modules accepted: Orders

## 2024-02-22 NOTE — Telephone Encounter (Signed)
 Ordered. Thanks

## 2024-02-22 NOTE — Telephone Encounter (Signed)
 Copied from CRM #[id number]. Topic: Clinical - Request for Lab/Test Order >> Feb 19, 2024  1:52 PM Victoria A wrote: Reason for CRM: Patient would ike along with labs ordered for Physical in February PSA added.

## 2024-03-06 ENCOUNTER — Other Ambulatory Visit: Payer: Self-pay | Admitting: Family Medicine

## 2024-03-07 NOTE — Telephone Encounter (Signed)
 Requesting: Tramadol  (Ultram ) 50 MG Tablet Contract: No UDS: No Last Visit: 04/23/2023 Next Visit: 04/25/2024 Last Refill: 12/02/2023  Please Advise

## 2024-03-18 ENCOUNTER — Ambulatory Visit: Payer: Medicare Other

## 2024-03-18 VITALS — Ht 67.0 in | Wt 155.0 lb

## 2024-03-18 DIAGNOSIS — Z0000 Encounter for general adult medical examination without abnormal findings: Secondary | ICD-10-CM | POA: Diagnosis not present

## 2024-03-18 NOTE — Progress Notes (Signed)
 "  Chief Complaint  Patient presents with   Medicare Wellness     Subjective:   [name]. is a 79 y.o. male who presents for a Medicare Annual Wellness Visit.  Visit info / Clinical Intake: Medicare Wellness Visit Type:: Subsequent Annual Wellness Visit Persons participating in visit and providing information:: patient Medicare Wellness Visit Mode:: Video Since this visit was completed virtually, some vitals may be partially provided or unavailable. Missing vitals are due to the limitations of the virtual format.: Unable to obtain vitals - no equipment If Telephone or Video please confirm:: I connected with patient using audio/video enable telemedicine. I verified patient identity with two identifiers, discussed telehealth limitations, and patient agreed to proceed. Patient Location:: home Provider Location:: office Interpreter Needed?: No Pre-visit prep was completed: yes AWV questionnaire completed by patient prior to visit?: no Living arrangements:: lives with spouse/significant other Patient's Overall Health Status Rating: very good Typical amount of pain: some Does pain affect daily life?: (!) yes Are you currently prescribed opioids?: (!) yes  Dietary Habits and Nutritional Risks How many meals a day?: 3 Eats fruit and vegetables daily?: yes Most meals are obtained by: preparing own meals In the last 2 weeks, have you had any of the following?: none Diabetic:: no  Functional Status Activities of Daily Living (to include ambulation/medication): Independent Ambulation: Independent Medication Administration: Independent Home Management (perform basic housework or laundry): Independent Manage your own finances?: yes Primary transportation is: driving Concerns about vision?: no *vision screening is required for [facility]* Concerns about hearing?: no  Fall Screening Falls in the past year?: 0 Number of falls in past year: 0 Was there an injury with Fall?: 0 Fall Risk  Category Calculator: 0 Patient Fall Risk Level: Low Fall Risk  Fall Risk Patient at Risk for Falls Due to: No Fall Risks Fall risk Follow up: Falls evaluation completed; Education provided; Falls prevention discussed  Home and Transportation Safety: All rugs have non-skid backing?: N/A, no rugs All stairs or steps have railings?: yes Grab bars in the bathtub or shower?: yes Have non-skid surface in bathtub or shower?: yes Good home lighting?: yes Regular seat belt use?: yes Hospital stays in the last year:: no  Cognitive Assessment Difficulty concentrating, remembering, or making decisions? : no Will 6CIT or Mini Cog be Completed: yes What year is it?: 0 points What month is it?: 0 points Give patient an address phrase to remember (5 components): [address] [place]  About what time is it?: 0 points Count backwards from 20 to 1: 0 points Say the months of the year in reverse: 0 points Repeat the address phrase from earlier: 0 points 6 CIT Score: 0 points  Advance Directives (For Healthcare) Does Patient Have a Medical Advance Directive?: Yes Does patient want to make changes to medical advance directive?: No - Patient declined Type of Advance Directive: Healthcare Power of [place]; Living will Copy of Healthcare Power of Attorney in Chart?: Yes - validated most recent copy scanned in chart (See row information) Copy of Living Will in Chart?: Yes - validated most recent copy scanned in chart (See row information)  Reviewed/Updated  Reviewed/Updated: Reviewed All (Medical, Surgical, Family, Medications, Allergies, Care Teams, Patient Goals)    Allergies (verified) Xarelto  [rivaroxaban ], Alprazolam, Benadryl  allergy [diphenhydramine  hcl], Doxycycline , Haldol [haloperidol], Lactose intolerance (gi), Lipitor [atorvastatin ], Melatonin, Morphine  and codeine, Tamsulosin , Zolpidem tartrate, Crestor  [rosuvastatin  calcium ], and Zetia  [ezetimibe ]   Current Medications  (verified) Outpatient Encounter Medications as of 03/18/2024  Medication  Sig   Alcaftadine 0.25 % SOLN Place 1 drop into both eyes daily as needed (LASTACAFT - itchy eyes).   amLODipine  (NORVASC ) 10 MG tablet Take 1 tablet (10 mg total) by mouth daily.   clopidogrel  (PLAVIX ) 75 MG tablet TAKE 1 TABLET BY MOUTH DAILY WITH BREAKFAST   diazepam  (VALIUM ) 5 MG tablet TAKE 1 TO 2 TABLETS BY  MOUTH EVERY 12 HOURS AS  NEEDED FOR ANXIETY AND FOR  INSOMNIA, sedation caution   Evolocumab  (REPATHA  SURECLICK) 140 MG/ML SOAJ Inject 140 mg into the skin every 14 (fourteen) days.   fluticasone  (FLONASE ) 50 MCG/ACT nasal spray Place 1 spray into both nostrils daily.   Homeopathic Products (THERAWORX RELIEF) FOAM    hydrocortisone cream 1 % Apply 1 application topically daily as needed for itching.   Lidocaine  HCl-Benzyl Alcohol (SALONPAS LIDOCAINE  PLUS) 4-10 % CREA Apply topically as needed.   nitroGLYCERIN  (NITROSTAT ) 0.4 MG SL tablet Place 1 tablet (0.4 mg total) under the tongue every 5 (five) minutes as needed for chest pain.   pantoprazole  (PROTONIX ) 40 MG tablet TAKE 1 TABLET BY MOUTH DAILY   rOPINIRole  (REQUIP ) 0.5 MG tablet Take 1 tablet (0.5 mg total) by mouth at bedtime.   SIMPLY SALINE NA Place 1 spray into both nostrils 2 (two) times daily.    traMADol  (ULTRAM ) 50 MG tablet Take 1-2 tablets (50-100 mg total) by mouth 3 (three) times daily as needed.   White Petrolatum-Mineral Oil (ARTIFICIAL TEARS) ointment Place 1 drop into both eyes daily.   No facility-administered encounter medications on file as of 03/18/2024.    History: Past Medical History:  Diagnosis Date   Adjustment reaction with physical symptoms    Anxiety    Cancer, skin, squamous cell    R shoulder, removed per derm 2012 and chest   Cataract 03/03/2020   Coronary artery disease    a. 05/2015 c/p post-op surgery; b. 07/2015 MV EF 53%, basal inferoseptal, mid inferoseptal, and apical inferior mild ischemia;  c. 08/2015 Cath/PCI: LM  nl, LAD 38m (3.5x20 Promus DES), 81m, LCX nl, RCA nl, EF 60%.   Diverticulosis 2018   Noted on colonoscopy   Ear ringing    Left   Elevated PSA    h/o with normal bx per URO   GERD (gastroesophageal reflux disease)    Hearing loss    Slight left ear   History of BPH    History of kidney stones    Hydronephrosis    Left, urteral obstruction, 2-3 cm UVJ   Hyperlipidemia    Hypertension    Nephrolithiasis    OA (osteoarthritis)    with r hip replacement   Osteoarthrosis involving, or with mention of more than one site, but not specified as generalized, site unspecified(715.80)    Prostate cancer (HCC)    Restless legs syndrome (RLS)    Ruptured lumbar disc    Sleep apnea    Syncope    negative cardiac w/u per Dr Dann   Tick bite 2011   likely tick associated illness, treated with doxy with resolution,   Viral exanthem, unspecified    Past Surgical History:  Procedure Laterality Date   ACHILLES TENDON REPAIR Left 1984   CARDIAC CATHETERIZATION N/A 08/08/2015   Procedure: Left Heart Cath and Coronary Angiography;  Surgeon: Oneil JAYSON Parchment, MD;  Location: [facility];  Service: Cardiovascular;  Laterality: N/A;   CARDIAC CATHETERIZATION N/A 08/08/2015   Procedure: Coronary Stent Intervention;  Surgeon: Candyce GORMAN Dann, MD;  Location: [facility];  Service: Cardiovascular;  Laterality: N/A;   CATARACT EXTRACTION, BILATERAL     Left 2024, Right 2023   COLONOSCOPY  2018   CORONARY STENT PLACEMENT  08/08/2015   MID LAD WITH DES   EYE SURGERY  February 06, 2021   JOINT REPLACEMENT     SHOULDER ARTHROSCOPY  2012   R rotator cuff (biceps tear not repaired)   SKIN CANCER EXCISION     right shoulder and mid chest   TOTAL HIP ARTHROPLASTY     right   TOTAL HIP ARTHROPLASTY Left 05/02/2015   Procedure: TOTAL LEFT HIP ARTHROPLASTY ANTERIOR APPROACH;  Surgeon: Dempsey Moan, MD;  Location: [facility];  Service: Orthopedics;  Laterality: Left;   TOTAL SHOULDER  ARTHROPLASTY Left 03/18/2018   Procedure: TOTAL SHOULDER ARTHROPLASTY;  Surgeon: Melita Drivers, MD;  Location: [facility];  Service: Orthopedics;  Laterality: Left;    Family History  Problem Relation Age of Onset   Alzheimer's disease Father    Arrhythmia Mother    Prostate cancer Maternal Uncle    Colon cancer Neg Hx    Heart attack Neg Hx    Hypertension Neg Hx    Social History   Occupational History   Occupation: Garment/textile Technologist: [employer]  Tobacco Use   Smoking status: Former    Current packs/day: 0.00    Average packs/day: 1.0 packs/day    Types: Cigarettes    Quit date: 04/12/1984    Years since quitting: 39.9   Smokeless tobacco: Never  Vaping Use   Vaping status: Never Used  Substance and Sexual Activity   Alcohol use: No    Alcohol/week: 0.0 standard drinks of alcohol   Drug use: No   Sexual activity: Yes   Tobacco Counseling Counseling given: Not Answered  SDOH Screenings   Food Insecurity: No Food Insecurity (03/18/2024)  Housing: Unknown (03/18/2024)  Transportation Needs: No Transportation Needs (03/18/2024)  Utilities: Not At Risk (03/18/2024)  Alcohol Screen: Low Risk (03/18/2023)  Depression (PHQ2-9): Low Risk (03/18/2024)  Financial Resource Strain: Low Risk (04/06/2023)  Physical Activity: Sufficiently Active (03/18/2024)  Social Connections: Socially Integrated (03/18/2024)  Stress: No Stress Concern Present (03/18/2024)  Tobacco Use: Medium Risk (03/18/2024)  Health Literacy: Adequate Health Literacy (03/18/2024)   See flowsheets for full screening details  Depression Screen PHQ 2 & 9 Depression Scale- Over the past 2 weeks, how often have you been bothered by any of the following problems? Little interest or pleasure in doing things: 0 Feeling down, depressed, or hopeless (PHQ Adolescent also includes...irritable): 0 PHQ-2 Total Score: 0 Trouble falling or staying asleep, or sleeping too much: 0 Feeling tired or having little  energy: 0 Poor appetite or overeating (PHQ Adolescent also includes...weight loss): 0 Feeling bad about yourself - or that you are a failure or have let yourself or your family down: 0 Trouble concentrating on things, such as reading the newspaper or watching television (PHQ Adolescent also includes...like school work): 0 Moving or speaking so slowly that other people could have noticed. Or the opposite - being so fidgety or restless that you have been moving around a lot more than usual: 0 Thoughts that you would be better off dead, or of hurting yourself in some way: 0 PHQ-9 Total Score: 0 If you checked off any problems, how difficult have these problems made it for you to do your work, take care of things at home, or get along with other people?:  Not difficult at all     Goals Addressed               This Visit's Progress     Patient Stated (pt-stated)   On track     Pt says he would like to control the diverticulitis symptoms: eliminating food and drink more water              Objective:    Today's Vitals   03/18/24 0900  Weight: 155 lb (70.3 kg)  Height: 5' 7 (1.702 m)   Body mass index is 24.28 kg/m.  Hearing/Vision screen No results found. Immunizations and Health Maintenance Health Maintenance  Topic Date Due   COVID-19 Vaccine (7 - 2025-26 season) 11/02/2023   Medicare Annual Wellness (AWV)  04/22/2024   DTaP/Tdap/Td (4 - Td or Tdap) 12/19/2026   Pneumococcal Vaccine: 50+ Years  Completed   Influenza Vaccine  Completed   Hepatitis C Screening  Completed   Zoster Vaccines- Shingrix  Completed   Meningococcal B Vaccine  Aged Out   Hepatitis B Vaccines 19-59 Average Risk  Discontinued   Colonoscopy  Discontinued        Assessment/Plan:  This is a routine wellness examination for [name].  Patient Care Team: Cleatus Arlyss RAMAN, MD as PCP - General Jeffrie Oneil BROCKS, MD as PCP - Cardiology (Cardiology) Waylan Cain, MD as Consulting Physician  (Ophthalmology) Shane Steffan BROCKS, MD as Consulting Physician (Urology)  I have personally reviewed and noted the following in the patients chart:   Medical and social history Use of alcohol, tobacco or illicit drugs  Current medications and supplements including opioid prescriptions. Functional ability and status Nutritional status Physical activity Advanced directives List of other physicians Hospitalizations, surgeries, and ER visits in previous 12 months Vitals Screenings to include cognitive, depression, and falls Referrals and appointments  No orders of the defined types were placed in this encounter.  In addition, I have reviewed and discussed with patient certain preventive protocols, quality metrics, and best practice recommendations. A written personalized care plan for preventive services as well as general preventive health recommendations were provided to patient.   Erminio LITTIE Saris, LPN   [id number]/83/[id number]    After Visit Summary: (MyChart) Due to this being a telephonic visit, the after visit summary with patients personalized plan was offered to patient via MyChart   Nurse Notes: No voiced or noted concerns at this time Vaccines not given: Covid declined today  "

## 2024-03-18 NOTE — Patient Instructions (Signed)
 Mr. Lombardo,  Thank you for taking the time for your Medicare Wellness Visit. I appreciate your continued commitment to your health goals. Please review the care plan we discussed, and feel free to reach out if I can assist you further.  Please note that Annual Wellness Visits do not include a physical exam. Some assessments may be limited, especially if the visit was conducted virtually. If needed, we may recommend an in-person follow-up with your provider.  Ongoing Care Seeing your primary care provider every 3 to 6 months helps us  monitor your health and provide consistent, personalized care.   Referrals If a referral was made during today's visit and you haven't received any updates within two weeks, please contact the referred provider directly to check on the status.  Recommended Screenings:  Health Maintenance  Topic Date Due   COVID-19 Vaccine (7 - 2025-26 season) 11/02/2023   Medicare Annual Wellness Visit  04/22/2024   DTaP/Tdap/Td vaccine (4 - Td or Tdap) 12/19/2026   Pneumococcal Vaccine for age over 69  Completed   Flu Shot  Completed   Hepatitis C Screening  Completed   Zoster (Shingles) Vaccine  Completed   Meningitis B Vaccine  Aged Out   Hepatitis B Vaccine  Discontinued   Colon Cancer Screening  Discontinued       02/21/2024    1:17 PM  Advanced Directives  Does Patient Have a Medical Advance Directive? No    Vision: Annual vision screenings are recommended for early detection of glaucoma, cataracts, and diabetic retinopathy. These exams can also reveal signs of chronic conditions such as diabetes and high blood pressure.  Dental: Annual dental screenings help detect early signs of oral cancer, gum disease, and other conditions linked to overall health, including heart disease and diabetes.  Please see the attached documents for additional preventive care recommendations.

## 2024-03-25 ENCOUNTER — Other Ambulatory Visit: Payer: Self-pay | Admitting: Family Medicine

## 2024-03-25 DIAGNOSIS — K219 Gastro-esophageal reflux disease without esophagitis: Secondary | ICD-10-CM

## 2024-04-25 ENCOUNTER — Encounter: Admitting: Family Medicine

## 2025-04-28 ENCOUNTER — Ambulatory Visit

## 2025-04-28 ENCOUNTER — Encounter: Admitting: Family Medicine
